# Patient Record
Sex: Female | Born: 1955 | Race: Black or African American | Hispanic: No | Marital: Married | Smoking: Former smoker
Health system: Southern US, Community
[De-identification: ages and names within clinical notes are randomized; demographics above are authoritative.]

## PROBLEM LIST (undated history)

## (undated) DIAGNOSIS — R51 Headache: Secondary | ICD-10-CM

## (undated) DIAGNOSIS — E119 Type 2 diabetes mellitus without complications: Secondary | ICD-10-CM

## (undated) DIAGNOSIS — D759 Disease of blood and blood-forming organs, unspecified: Secondary | ICD-10-CM

## (undated) DIAGNOSIS — R238 Other skin changes: Secondary | ICD-10-CM

## (undated) DIAGNOSIS — M1990 Unspecified osteoarthritis, unspecified site: Secondary | ICD-10-CM

## (undated) DIAGNOSIS — R233 Spontaneous ecchymoses: Secondary | ICD-10-CM

## (undated) DIAGNOSIS — R519 Headache, unspecified: Secondary | ICD-10-CM

## (undated) DIAGNOSIS — F419 Anxiety disorder, unspecified: Secondary | ICD-10-CM

## (undated) DIAGNOSIS — M79606 Pain in leg, unspecified: Secondary | ICD-10-CM

## (undated) DIAGNOSIS — I10 Essential (primary) hypertension: Secondary | ICD-10-CM

## (undated) DIAGNOSIS — E785 Hyperlipidemia, unspecified: Secondary | ICD-10-CM

## (undated) DIAGNOSIS — G4730 Sleep apnea, unspecified: Secondary | ICD-10-CM

## (undated) DIAGNOSIS — J189 Pneumonia, unspecified organism: Secondary | ICD-10-CM

## (undated) HISTORY — DX: Essential (primary) hypertension: I10

## (undated) HISTORY — PX: DILATION AND CURETTAGE OF UTERUS: SHX78

## (undated) HISTORY — DX: Headache, unspecified: R51.9

## (undated) HISTORY — PX: TOE SURGERY: SHX1073

## (undated) HISTORY — DX: Other skin changes: R23.8

## (undated) HISTORY — DX: Headache: R51

## (undated) HISTORY — DX: Pain in leg, unspecified: M79.606

## (undated) HISTORY — DX: Sleep apnea, unspecified: G47.30

## (undated) HISTORY — DX: Spontaneous ecchymoses: R23.3

## (undated) HISTORY — DX: Type 2 diabetes mellitus without complications: E11.9

---

## 2004-10-01 ENCOUNTER — Ambulatory Visit: Payer: Self-pay | Admitting: Internal Medicine

## 2005-12-03 ENCOUNTER — Ambulatory Visit: Payer: Self-pay

## 2006-07-31 ENCOUNTER — Ambulatory Visit: Payer: Self-pay | Admitting: Family Medicine

## 2006-12-01 ENCOUNTER — Ambulatory Visit: Payer: Self-pay | Admitting: Family Medicine

## 2006-12-10 ENCOUNTER — Ambulatory Visit: Payer: Self-pay | Admitting: Internal Medicine

## 2007-04-13 ENCOUNTER — Ambulatory Visit: Payer: Self-pay | Admitting: Internal Medicine

## 2007-04-16 ENCOUNTER — Ambulatory Visit: Payer: Self-pay | Admitting: Internal Medicine

## 2008-01-05 ENCOUNTER — Ambulatory Visit: Payer: Self-pay | Admitting: Internal Medicine

## 2008-09-16 ENCOUNTER — Ambulatory Visit: Payer: Self-pay | Admitting: Internal Medicine

## 2008-10-04 ENCOUNTER — Ambulatory Visit: Payer: Self-pay | Admitting: Internal Medicine

## 2008-10-17 ENCOUNTER — Ambulatory Visit: Payer: Self-pay | Admitting: Internal Medicine

## 2009-01-23 ENCOUNTER — Ambulatory Visit: Payer: Self-pay | Admitting: Internal Medicine

## 2009-03-07 ENCOUNTER — Ambulatory Visit: Payer: Self-pay | Admitting: Internal Medicine

## 2010-12-18 ENCOUNTER — Ambulatory Visit: Payer: Self-pay

## 2011-05-09 ENCOUNTER — Ambulatory Visit: Payer: Self-pay | Admitting: Gastroenterology

## 2011-05-10 LAB — PATHOLOGY REPORT

## 2012-01-16 ENCOUNTER — Ambulatory Visit: Payer: Self-pay

## 2012-01-28 ENCOUNTER — Ambulatory Visit: Payer: Self-pay | Admitting: Internal Medicine

## 2013-03-01 ENCOUNTER — Ambulatory Visit: Payer: Self-pay

## 2013-03-15 ENCOUNTER — Ambulatory Visit: Payer: Self-pay | Admitting: Podiatry

## 2013-09-14 ENCOUNTER — Ambulatory Visit: Payer: Self-pay | Admitting: Podiatry

## 2014-03-02 ENCOUNTER — Ambulatory Visit: Payer: Self-pay

## 2015-08-15 ENCOUNTER — Other Ambulatory Visit: Payer: Self-pay | Admitting: Nurse Practitioner

## 2015-08-15 ENCOUNTER — Ambulatory Visit
Admission: RE | Admit: 2015-08-15 | Discharge: 2015-08-15 | Disposition: A | Discharge status: Home / Self Care | Payer: PRIVATE HEALTH INSURANCE | Source: Ambulatory Visit | Attending: Nurse Practitioner | Admitting: Nurse Practitioner

## 2015-08-15 DIAGNOSIS — M25561 Pain in right knee: Secondary | ICD-10-CM

## 2015-08-15 DIAGNOSIS — M25571 Pain in right ankle and joints of right foot: Secondary | ICD-10-CM | POA: Insufficient documentation

## 2017-09-23 ENCOUNTER — Encounter: Payer: Self-pay | Admitting: Nurse Practitioner

## 2017-09-23 ENCOUNTER — Ambulatory Visit: Payer: Managed Care, Other (non HMO) | Admitting: Nurse Practitioner

## 2017-09-23 VITALS — BP 157/92 | HR 83 | Resp 93 | Ht 67.0 in | Wt 257.2 lb

## 2017-09-23 DIAGNOSIS — E559 Vitamin D deficiency, unspecified: Secondary | ICD-10-CM | POA: Diagnosis not present

## 2017-09-23 DIAGNOSIS — B9689 Other specified bacterial agents as the cause of diseases classified elsewhere: Secondary | ICD-10-CM

## 2017-09-23 DIAGNOSIS — E876 Hypokalemia: Secondary | ICD-10-CM | POA: Diagnosis not present

## 2017-09-23 DIAGNOSIS — N760 Acute vaginitis: Secondary | ICD-10-CM

## 2017-09-23 DIAGNOSIS — I10 Essential (primary) hypertension: Secondary | ICD-10-CM | POA: Diagnosis not present

## 2017-09-23 DIAGNOSIS — R300 Dysuria: Secondary | ICD-10-CM | POA: Insufficient documentation

## 2017-09-23 DIAGNOSIS — D229 Melanocytic nevi, unspecified: Secondary | ICD-10-CM | POA: Insufficient documentation

## 2017-09-23 DIAGNOSIS — N390 Urinary tract infection, site not specified: Secondary | ICD-10-CM

## 2017-09-23 DIAGNOSIS — M25571 Pain in right ankle and joints of right foot: Secondary | ICD-10-CM | POA: Insufficient documentation

## 2017-09-23 DIAGNOSIS — D509 Iron deficiency anemia, unspecified: Secondary | ICD-10-CM | POA: Insufficient documentation

## 2017-09-23 DIAGNOSIS — G4733 Obstructive sleep apnea (adult) (pediatric): Secondary | ICD-10-CM

## 2017-09-23 DIAGNOSIS — E669 Obesity, unspecified: Secondary | ICD-10-CM | POA: Diagnosis not present

## 2017-09-23 DIAGNOSIS — M25561 Pain in right knee: Secondary | ICD-10-CM | POA: Insufficient documentation

## 2017-09-23 DIAGNOSIS — R7301 Impaired fasting glucose: Secondary | ICD-10-CM | POA: Diagnosis not present

## 2017-09-23 DIAGNOSIS — R5383 Other fatigue: Secondary | ICD-10-CM

## 2017-09-23 DIAGNOSIS — E1159 Type 2 diabetes mellitus with other circulatory complications: Secondary | ICD-10-CM | POA: Insufficient documentation

## 2017-09-23 LAB — POCT URINALYSIS DIPSTICK
Bilirubin, UA: NEGATIVE
Blood, UA: NEGATIVE
Glucose, UA: NEGATIVE
Ketones, UA: NEGATIVE
Nitrite, UA: NEGATIVE
Spec Grav, UA: 1.01 (ref 1.010–1.025)
Urobilinogen, UA: 0.2 E.U./dL
pH, UA: 6.5 (ref 5.0–8.0)

## 2017-09-23 MED ORDER — METRONIDAZOLE 0.75 % VA GEL: 1.0000 | Freq: Every day | VAGINAL | 0 refills | Status: AC

## 2017-09-23 MED ORDER — NITROFURANTOIN MONOHYD MACRO 100 MG PO CAPS: 100.0000 mg | ORAL_CAPSULE | Freq: Two times a day (BID) | ORAL | 0 refills | Status: DC

## 2017-09-23 NOTE — Progress Notes (Signed)
[facility] [place], [place] [address]  Internal MEDICINE  Office Visit Note  Patient Name: Jill Banks  [id number]  [id number]  Date of Service: 09/28/2017     Complaints/HPI Pt is here for routine follow up.  Patient is here for routine follow up visit. Today, blood pressure is slightly elevated today. She is taking her BP medication as prescribed. Using CPAP every night. Is trying to lose weight. No longer taking appetite suppressants. Really did not see any difference in weight soe is taking part in group at work.  Continues to have some vaginal discharge, slightly yellow in color with mild odor. Has been treated for UTI over past few months. Improves the symptoms for a short time, but symptoms come right back.     Current Medication: Outpatient Encounter Medications as of 09/23/2017  Medication Sig  . calcitRIOL (ROCALTROL) 0.25 MCG capsule Take 0.25 mcg by mouth daily.  . hydrochlorothiazide (HYDRODIURIL) 25 MG tablet Take 25 mg by mouth daily.  Marland Kitchen olmesartan (BENICAR) 20 MG tablet Take 20 mg by mouth 2 (two) times daily.  . potassium chloride SA (KLOR-CON M20) 20 MEQ tablet Take 20 mEq by mouth 2 (two) times daily.  . metroNIDAZOLE (METROGEL) 0.75 % vaginal gel Place 1 Applicatorful vaginally at bedtime for 7 days.  . nitrofurantoin, macrocrystal-monohydrate, (MACROBID) 100 MG capsule Take 1 capsule (100 mg total) by mouth 2 (two) times daily.  . [DISCONTINUED] phentermine 37.5 MG capsule Take 37.5 mg by mouth daily.   No facility-administered encounter medications on file as of 09/23/2017.     Surgical History: Past Surgical History:  Procedure Laterality Date  . TOE SURGERY      Medical History: Past Medical History:  Diagnosis Date  . Bruises easily   . Headache   . Hypertension   . Leg pain   . Sleep apnea     Family History: Family History  Problem Relation Age of Onset  . Hypertension Father   . Stroke Father   . Hypertension  Brother   . Stroke Brother   . Hyperlipidemia Brother   . Asthma Son     Social History   Socioeconomic History  . Marital status: Married    Spouse name: Not on file  . Number of children: Not on file  . Years of education: Not on file  . Highest education level: Not on file  Social Needs  . Financial resource strain: Not on file  . Food insecurity - worry: Not on file  . Food insecurity - inability: Not on file  . Transportation needs - medical: Not on file  . Transportation needs - non-medical: Not on file  Occupational History  . Not on file  Tobacco Use  . Smoking status: Former Research scientist (life sciences)  . Smokeless tobacco: Never Used  Substance and Sexual Activity  . Alcohol use: Yes    Comment: ocassionally  . Drug use: No  . Sexual activity: Not on file  Other Topics Concern  . Not on file  Social History Narrative  . Not on file      Review of Systems  Constitutional: Negative for activity change, appetite change and unexpected weight change.  HENT: Negative for postnasal drip, rhinorrhea and sinus pressure.   Respiratory: Negative for shortness of breath and wheezing.   Cardiovascular: Negative for chest pain, palpitations and leg swelling.       Blood pressure elevated  Gastrointestinal: Negative for constipation, diarrhea, nausea and vomiting.  Endocrine: Negative.  Genitourinary: Positive for frequency, urgency and vaginal discharge.  Musculoskeletal: Negative.   Skin: Negative.   Allergic/Immunologic: Negative.   Neurological: Negative.   Hematological: Negative for adenopathy. Does not bruise/bleed easily.  Psychiatric/Behavioral: Negative for decreased concentration and sleep disturbance. The patient is not nervous/anxious.     Today's Vitals   09/23/17 1618  BP: (!) 157/92  Pulse: 83  Resp: (!) 93  Weight: 257 lb 3.2 oz (116.7 kg)  Height: 5\' 7"  (1.702 m)    Physical Exam  Constitutional: She is oriented to person, place, and time. She appears  well-developed and well-nourished.  HENT:  Head: Normocephalic and atraumatic.  Eyes: Pupils are equal, round, and reactive to light.  Neck: Normal range of motion. Neck supple. No thyromegaly present.  Cardiovascular: Normal rate, regular rhythm and normal heart sounds.  Pulmonary/Chest: Effort normal and breath sounds normal. She has no wheezes.  Abdominal: Soft. Bowel sounds are normal. There is no tenderness.  Genitourinary:  Genitourinary Comments: Urine sample positive for moderate WBC and trace protein.   Musculoskeletal: Normal range of motion.  Lymphadenopathy:    She has no cervical adenopathy.  Neurological: She is alert and oriented to person, place, and time.  Skin: Skin is warm and dry.  Psychiatric: She has a normal mood and affect.  Nursing note and vitals reviewed.   Assessment/Plan:    ICD-10-CM   1. Urinary tract infection without hematuria, site unspecified N39.0 POCT urinalysis dipstick    CULTURE, URINE COMPREHENSIVE    nitrofurantoin, macrocrystal-monohydrate, (MACROBID) 100 MG capsule    CANCELED: CULTURE, URINE COMPREHENSIVE  2. Impaired fasting glucose R73.01 Comprehensive metabolic panel    Hgb X7L w/o eAG  3. Obesity, unspecified classification, unspecified obesity type, unspecified whether serious comorbidity present E66.9 T4, free    TSH  4. Dysuria R30.0   5. Bacterial vaginitis N76.0 metroNIDAZOLE (METROGEL) 0.75 % vaginal gel   B96.89   6. Essential (primary) hypertension I10 CBC with Differential/Platelet    Comprehensive metabolic panel    Lipid panel  7. Hypokalemia E87.6 Comprehensive metabolic panel  8. Other fatigue R53.83 T4, free    TSH  9. Vitamin D deficiency, unspecified E55.9 Vitamin D 1,25 dihydroxy  10. Obstructive sleep apnea, adult G47.33   1. Start patient on macrobid 100mg  twice daily for 10 days. Send urine for culture and sensitivity and adjust antibiotics as indicated  2. Recommend OTC AZO to reduce bladder pain/spasms.   3. Metronidazole vaginal gel should be used every night for 7 nights.  4. Generally, bp stable. Continue bp medications as prescribed 5. CMP ordered 6. CMP and thyroid panel ordered today 7. Vitamin d test ordered/. Treat deficiency as indicated.  8. CMP and HgbA1c ordered for further evaluation 9. Thyroid panel ordered. Recommend patient follow a 1500calorie diet and participation in regular exercise program.  10 will inquired into new CPAP equipment. Continue regular visits witth Dr. Devona Konig as scheduled..    General Counseling: I have discussed the findings of the evaluation and examination with Rodena Piety.  I have also discussed any further diagnostic evaluation that may be needed or ordered today. Anastashia verbalizes understanding of the findings of todays visit. We also reviewed her medications today. she has been encouraged to call the office with any questions or concerns that should arise related to todays visit.  This patient was seen by Leretha Pol, FNP- C in Collaboration with Dr Lavera Guise as a part of collaborative care agreement    Time spent:25 minutes  Dr Lavera Guise Internal medicine

## 2017-09-26 LAB — CULTURE, URINE COMPREHENSIVE

## 2017-09-28 ENCOUNTER — Encounter: Payer: Self-pay | Admitting: Nurse Practitioner

## 2017-10-01 LAB — CBC WITH DIFFERENTIAL/PLATELET
Basophils Absolute: 0.1 10*3/uL (ref 0.0–0.2)
Basos: 1 %
EOS (ABSOLUTE): 0.4 10*3/uL (ref 0.0–0.4)
Eos: 5 %
Hematocrit: 41.7 % (ref 34.0–46.6)
Hemoglobin: 13.8 g/dL (ref 11.1–15.9)
Immature Grans (Abs): 0 10*3/uL (ref 0.0–0.1)
Immature Granulocytes: 0 %
Lymphocytes Absolute: 3 10*3/uL (ref 0.7–3.1)
Lymphs: 40 %
MCH: 27.1 pg (ref 26.6–33.0)
MCHC: 33.1 g/dL (ref 31.5–35.7)
MCV: 82 fL (ref 79–97)
Monocytes Absolute: 0.5 10*3/uL (ref 0.1–0.9)
Monocytes: 7 %
Neutrophils Absolute: 3.5 10*3/uL (ref 1.4–7.0)
Neutrophils: 47 %
Platelets: 152 10*3/uL (ref 150–379)
RBC: 5.1 x10E6/uL (ref 3.77–5.28)
RDW: 15.9 % — ABNORMAL HIGH (ref 12.3–15.4)
WBC: 7.5 10*3/uL (ref 3.4–10.8)

## 2017-10-01 LAB — COMPREHENSIVE METABOLIC PANEL
ALT: 16 IU/L (ref 0–32)
AST: 20 IU/L (ref 0–40)
Albumin/Globulin Ratio: 1.5 (ref 1.2–2.2)
Albumin: 4.3 g/dL (ref 3.6–4.8)
Alkaline Phosphatase: 69 IU/L (ref 39–117)
BUN/Creatinine Ratio: 19 (ref 12–28)
BUN: 12 mg/dL (ref 8–27)
Bilirubin Total: 0.3 mg/dL (ref 0.0–1.2)
CO2: 27 mmol/L (ref 20–29)
Calcium: 10 mg/dL (ref 8.7–10.3)
Chloride: 101 mmol/L (ref 96–106)
Creatinine, Ser: 0.64 mg/dL (ref 0.57–1.00)
GFR calc Af Amer: 111 mL/min/{1.73_m2} (ref >59)
GFR calc non Af Amer: 97 mL/min/{1.73_m2} (ref >59)
Globulin, Total: 2.8 g/dL (ref 1.5–4.5)
Glucose: 93 mg/dL (ref 65–99)
Potassium: 4.2 mmol/L (ref 3.5–5.2)
Sodium: 143 mmol/L (ref 134–144)
Total Protein: 7.1 g/dL (ref 6.0–8.5)

## 2017-10-01 LAB — LIPID PANEL
Chol/HDL Ratio: 3.3 ratio (ref 0.0–4.4)
Cholesterol, Total: 186 mg/dL (ref 100–199)
HDL: 57 mg/dL (ref >39)
LDL Calculated: 110 mg/dL — ABNORMAL HIGH (ref 0–99)
Triglycerides: 93 mg/dL (ref 0–149)
VLDL Cholesterol Cal: 19 mg/dL (ref 5–40)

## 2017-10-01 LAB — VITAMIN D 1,25 DIHYDROXY
Vitamin D 1, 25 (OH)2 Total: 44 pg/mL
Vitamin D2 1, 25 (OH)2: <10 pg/mL
Vitamin D3 1, 25 (OH)2: 41 pg/mL

## 2017-10-01 LAB — CULTURE, URINE COMPREHENSIVE

## 2017-10-01 LAB — TSH: TSH: 3.01 u[IU]/mL (ref 0.450–4.500)

## 2017-10-01 LAB — HGB A1C W/O EAG: Hgb A1c MFr Bld: 6.4 % — ABNORMAL HIGH (ref 4.8–5.6)

## 2017-10-01 LAB — T4, FREE: Free T4: 1.23 ng/dL (ref 0.82–1.77)

## 2017-10-06 NOTE — Progress Notes (Signed)
Called pt to let her know her labs look good.

## 2017-10-14 ENCOUNTER — Other Ambulatory Visit: Payer: Self-pay

## 2017-10-15 ENCOUNTER — Other Ambulatory Visit: Payer: Self-pay

## 2017-10-15 MED ORDER — OLMESARTAN MEDOXOMIL 40 MG PO TABS: 40.0000 mg | ORAL_TABLET | Freq: Every day | ORAL | 1 refills | Status: DC

## 2017-10-15 NOTE — Telephone Encounter (Signed)
Pt called insurance is not going to covered olmesartan 20 mg 2 day they covered olmesartan 40 mg one daily as per heather send new pres

## 2017-10-28 ENCOUNTER — Other Ambulatory Visit: Payer: Self-pay

## 2017-10-28 MED ORDER — POTASSIUM CHLORIDE CRYS ER 20 MEQ PO TBCR: 20.0000 meq | EXTENDED_RELEASE_TABLET | Freq: Two times a day (BID) | ORAL | 5 refills | Status: DC

## 2017-11-07 ENCOUNTER — Other Ambulatory Visit: Payer: Self-pay

## 2017-11-07 MED ORDER — HYDROCHLOROTHIAZIDE 25 MG PO TABS: 25.0000 mg | ORAL_TABLET | Freq: Every day | ORAL | 5 refills | Status: DC

## 2017-12-05 ENCOUNTER — Other Ambulatory Visit: Payer: Self-pay

## 2017-12-05 MED ORDER — CALCITRIOL 0.25 MCG PO CAPS: 0.2500 ug | ORAL_CAPSULE | Freq: Every day | ORAL | 5 refills | Status: DC

## 2017-12-18 ENCOUNTER — Encounter: Payer: Self-pay | Admitting: Nurse Practitioner

## 2017-12-18 ENCOUNTER — Ambulatory Visit: Payer: Managed Care, Other (non HMO) | Admitting: Nurse Practitioner

## 2017-12-18 VITALS — BP 163/98 | HR 92 | Resp 16 | Ht 67.0 in | Wt 260.0 lb

## 2017-12-18 DIAGNOSIS — B9689 Other specified bacterial agents as the cause of diseases classified elsewhere: Secondary | ICD-10-CM

## 2017-12-18 DIAGNOSIS — M064 Inflammatory polyarthropathy: Secondary | ICD-10-CM

## 2017-12-18 DIAGNOSIS — N760 Acute vaginitis: Secondary | ICD-10-CM | POA: Diagnosis not present

## 2017-12-18 DIAGNOSIS — R300 Dysuria: Secondary | ICD-10-CM | POA: Diagnosis not present

## 2017-12-18 DIAGNOSIS — R7301 Impaired fasting glucose: Secondary | ICD-10-CM | POA: Diagnosis not present

## 2017-12-18 DIAGNOSIS — I10 Essential (primary) hypertension: Secondary | ICD-10-CM | POA: Diagnosis not present

## 2017-12-18 LAB — POCT URINALYSIS DIPSTICK
Bilirubin, UA: NEGATIVE
Glucose, UA: NEGATIVE
Ketones, UA: NEGATIVE
Nitrite, UA: NEGATIVE
Protein, UA: NEGATIVE
Spec Grav, UA: 1.01 (ref 1.010–1.025)
Urobilinogen, UA: 0.2 E.U./dL
pH, UA: 7.5 (ref 5.0–8.0)

## 2017-12-18 MED ORDER — METRONIDAZOLE 500 MG PO TABS: 500.0000 mg | ORAL_TABLET | Freq: Two times a day (BID) | ORAL | 0 refills | Status: DC

## 2017-12-18 NOTE — Progress Notes (Signed)
[facility] [place], [place] [address]  Internal MEDICINE  Office Visit Note  Patient Name: Jill Banks  [id number]  [id number]  Date of Service: 01/11/2018  Chief Complaint  Patient presents with  . Vaginal Discharge    going on for two months medication given for it works for a little and then it stops working. no burning or itching     Continues to have some vaginal discharge, slightly yellow in color with mild odor. Has been treated for UTI over past few months. Improves the symptoms for a short time, but symptoms come right back. Most recently, she was treated in 09/2017. She states that symptoms did get better, but again, they keep coming right back.  The patient is also c/o pain and inflammation of both knees. Sometimes, this is in shoulders and back as well. She has family history of both lupus and Rheumatoid arthritis. Afraid she may be developing this issue.  Pt is here for a sick visit.     Current Medication:  Outpatient Encounter Medications as of 12/18/2017  Medication Sig  . calcitRIOL (ROCALTROL) 0.25 MCG capsule Take 1 capsule (0.25 mcg total) by mouth daily.  . hydrochlorothiazide (HYDRODIURIL) 25 MG tablet Take 1 tablet (25 mg total) by mouth daily.  . nitrofurantoin, macrocrystal-monohydrate, (MACROBID) 100 MG capsule Take 1 capsule (100 mg total) by mouth 2 (two) times daily.  . potassium chloride SA (KLOR-CON M20) 20 MEQ tablet Take 1 tablet (20 mEq total) by mouth 2 (two) times daily.  . [DISCONTINUED] olmesartan (BENICAR) 40 MG tablet Take 1 tablet (40 mg total) by mouth daily.  . metroNIDAZOLE (FLAGYL) 500 MG tablet Take 1 tablet (500 mg total) by mouth 2 (two) times daily.  . naproxen sodium (ALEVE) 220 MG tablet Take by mouth.   No facility-administered encounter medications on file as of 12/18/2017.       Medical History: Past Medical History:  Diagnosis Date  . Bruises easily   . Headache   . Hypertension   . Leg  pain   . Sleep apnea     Today's Vitals   12/18/17 1214  BP: (!) 163/98  Pulse: 92  Resp: 16  SpO2: 96%  Weight: 260 lb (117.9 kg)  Height: 5' 7"  (1.702 m)     Review of Systems  Constitutional: Positive for fatigue. Negative for activity change, appetite change and unexpected weight change.  HENT: Negative for postnasal drip, rhinorrhea and sinus pressure.   Eyes: Negative.   Respiratory: Negative for shortness of breath and wheezing.   Cardiovascular: Negative for chest pain, palpitations and leg swelling.       Blood pressure elevated  Gastrointestinal: Negative for constipation, diarrhea, nausea and vomiting.  Endocrine: Negative.   Genitourinary: Positive for dysuria, frequency, urgency and vaginal discharge.  Musculoskeletal: Positive for arthralgias.       Pain In both knees, sometimes in shoulders and upper back.   Skin: Negative.   Allergic/Immunologic: Negative.   Neurological: Negative for headaches.  Hematological: Negative for adenopathy. Does not bruise/bleed easily.  Psychiatric/Behavioral: Negative for decreased concentration and sleep disturbance. The patient is not nervous/anxious.     Physical Exam  Constitutional: She is oriented to person, place, and time. She appears well-developed and well-nourished. No distress.  HENT:  Head: Normocephalic and atraumatic.  Mouth/Throat: Oropharynx is clear and moist. No oropharyngeal exudate.  Eyes: Pupils are equal, round, and reactive to light. EOM are normal.  Neck: Normal range of motion. Neck  supple. No JVD present. No tracheal deviation present. No thyromegaly present.  Cardiovascular: Normal rate, regular rhythm and normal heart sounds. Exam reveals no gallop and no friction rub.  No murmur heard. Pulmonary/Chest: Effort normal and breath sounds normal. No respiratory distress. She has no wheezes. She has no rales. She exhibits no tenderness.  Abdominal: Soft. Bowel sounds are normal. There is no tenderness.   Genitourinary: No labial fusion. There is no rash, tenderness, lesion or injury on the right labia. There is no rash, tenderness, lesion or injury on the left labia. Cervix exhibits discharge. No erythema, tenderness or bleeding in the vagina. No foreign body in the vagina. No signs of injury around the vagina. No vaginal discharge found.  Genitourinary Comments: Small to moderate amount clear discharge. Very slight odor present.  Urine sample positive for large WBC and trace blood.   Musculoskeletal: Normal range of motion.  Mild, bilateral knee pain. Worse with flexion and extension. ROM and strength currently intact.   Lymphadenopathy:    She has no cervical adenopathy.  Neurological: She is alert and oriented to person, place, and time. No cranial nerve deficit.  Skin: Skin is warm and dry. She is not diaphoretic.  Psychiatric: She has a normal mood and affect. Her behavior is normal. Judgment and thought content normal.  Nursing note and vitals reviewed.   Assessment/Plan:  1. Bacterial vaginitis - metroNIDAZOLE (FLAGYL) 500 MG tablet; Take 1 tablet (500 mg total) by mouth 2 (two) times daily.  Dispense: 20 tablet; Refill: 0  2. Acute vaginitis Obtain vaginal sample today and will treat as indicated, based on results.  - NuSwab Vaginitis Plus (VG+)  3. Inflammatory polyarthritis ([place]) Check labs - ANA Direct w/Reflex if Positive - Rheumatoid Factor - Sed Rate (ESR)  4. Essential (primary) hypertension Generally stable. Continue bp medication as prescribed.  5. Impaired fasting glucose Check labs  - Basic Metabolic Panel (BMET) - HgB A1c  6. Dysuria Treat for bacterial vaginitis today. Will treat for UTI based on results of culture and sensitivity of urine sample.  - POCT Urinalysis Dipstick - CULTURE, URINE COMPREHENSIVE   General Counseling: Maisyn verbalizes understanding of the findings of todays visit and agrees with plan of treatment. I have discussed any further  diagnostic evaluation that may be needed or ordered today. We also reviewed her medications today. she has been encouraged to call the office with any questions or concerns that should arise related to todays visit.   This patient was seen by Leretha Pol, FNP- C in Collaboration with Dr Lavera Guise as a part of collaborative care agreement    Orders Placed This Encounter  Procedures  . CULTURE, URINE COMPREHENSIVE  . ANA Direct w/Reflex if Positive  . Rheumatoid Factor  . Sed Rate (ESR)  . Basic Metabolic Panel (BMET)  . HgB A1c  . NuSwab Vaginitis Plus (VG+)  . POCT Urinalysis Dipstick    Meds ordered this encounter  Medications  . metroNIDAZOLE (FLAGYL) 500 MG tablet    Sig: Take 1 tablet (500 mg total) by mouth 2 (two) times daily.    Dispense:  20 tablet    Refill:  0    Order Specific Question:   Supervising Provider    Answer:   Lavera Guise [[id number]]    Time spent: 25 Minutes

## 2017-12-20 LAB — CULTURE, URINE COMPREHENSIVE

## 2017-12-22 ENCOUNTER — Telehealth: Payer: Self-pay

## 2017-12-22 LAB — NUSWAB VAGINITIS PLUS (VG+)
Candida albicans, NAA: NEGATIVE
Candida glabrata, NAA: NEGATIVE
Chlamydia trachomatis, NAA: NEGATIVE
Neisseria gonorrhoeae, NAA: NEGATIVE
Trich vag by NAA: NEGATIVE

## 2017-12-22 NOTE — Telephone Encounter (Signed)
Error

## 2017-12-22 NOTE — Progress Notes (Signed)
Can you let the patient know that specimens taken at her visit showed just normal flora urinary tract infection. This is potentially contributing to the symptoms she is having. Continue antibitics as prescribed. If persistent after this treatment, may need to refer her to gYN. thanks

## 2017-12-22 NOTE — Progress Notes (Signed)
Pt was called and notified of the following.

## 2017-12-29 ENCOUNTER — Telehealth: Payer: Self-pay

## 2017-12-29 ENCOUNTER — Other Ambulatory Visit: Payer: Self-pay | Admitting: Nurse Practitioner

## 2017-12-29 DIAGNOSIS — I10 Essential (primary) hypertension: Secondary | ICD-10-CM

## 2017-12-29 MED ORDER — TELMISARTAN 80 MG PO TABS: 80.0000 mg | ORAL_TABLET | Freq: Every day | ORAL | 3 refills | Status: DC

## 2017-12-29 NOTE — Progress Notes (Signed)
D/c olmesartan d/t backorder. Start telmisartan 80mg  daily. New rx sent to her pharmacy.

## 2017-12-29 NOTE — Telephone Encounter (Signed)
Pt advised we change olmesartan to telmartan

## 2017-12-29 NOTE — Telephone Encounter (Signed)
D/c olmesartan d/t backorder. Start telmisartan 80mg  daily. New rx sent to her pharmacy.

## 2018-01-05 ENCOUNTER — Telehealth: Payer: Self-pay

## 2018-01-05 NOTE — Telephone Encounter (Signed)
With all of her testing being normal, I think it's time to send her to see GYN provider. thanks

## 2018-01-06 NOTE — Telephone Encounter (Signed)
Pt advised need to see gyn and send beth reminder for referal

## 2018-01-11 DIAGNOSIS — N760 Acute vaginitis: Secondary | ICD-10-CM | POA: Insufficient documentation

## 2018-01-13 ENCOUNTER — Encounter: Payer: Self-pay | Admitting: Obstetrics and Gynecology

## 2018-01-13 ENCOUNTER — Ambulatory Visit (INDEPENDENT_AMBULATORY_CARE_PROVIDER_SITE_OTHER): Payer: Managed Care, Other (non HMO) | Admitting: Obstetrics and Gynecology

## 2018-01-13 VITALS — BP 150/80 | HR 82 | Ht 67.0 in | Wt 258.0 lb

## 2018-01-13 DIAGNOSIS — N898 Other specified noninflammatory disorders of vagina: Secondary | ICD-10-CM

## 2018-01-13 LAB — POCT WET PREP WITH KOH
Clue Cells Wet Prep HPF POC: NEGATIVE
KOH Prep POC: NEGATIVE
Trichomonas, UA: NEGATIVE
WBC Wet Prep HPF POC: POSITIVE
Yeast Wet Prep HPF POC: NEGATIVE

## 2018-01-13 NOTE — Patient Instructions (Signed)
I value your feedback and entrusting us with your care. If you get a [facility] patient survey, I would appreciate you taking the time to let us know about your experience today. Thank you! 

## 2018-01-13 NOTE — Progress Notes (Signed)
Jill Freshwater, NP   Chief Complaint  Patient presents with  . Vaginal Discharge    HPI:      Ms. Jill Banks is a 62 y.o. [phone] who LMP was No LMP recorded., presents today for NP eval of persistent increased yellow, thin vag d/c, without irritation/odor. Sx for 3-4 months and started randomly. Referred by PCP Junius Creamer, Greer Ee, NP). Pt has been treated with metrogel and metronidazole with temporary relief of sx. Wears a pad but changes BID. [name] has SUI but pt sure it's vag d/c not urine in pad. Vaginitis culture 12/18/17 was neg for yeast, gon/chlam/trich. Pos for atopobium but low BVAB 2 and megasphaera 1. Pt denies any pelvic pain, vag dryness. She is occas sex active. No vag bleeding/spotting. Occas vasomotor sx. Neg urine C&S 4/19. Current on paps per pt report.   Past Medical History:  Diagnosis Date  . Bruises easily   . Headache   . Hypertension   . Leg pain   . Sleep apnea     Past Surgical History:  Procedure Laterality Date  . TOE SURGERY      Family History  Problem Relation Age of Onset  . Hypertension Father   . Stroke Father   . Hypertension Brother   . Stroke Brother   . Hyperlipidemia Brother   . Asthma Son     Social History   Socioeconomic History  . Marital status: Married    Spouse name: Not on file  . Number of children: Not on file  . Years of education: Not on file  . Highest education level: Not on file  Occupational History  . Not on file  Social Needs  . Financial resource strain: Not on file  . Food insecurity:    Worry: Not on file    Inability: Not on file  . Transportation needs:    Medical: Not on file    Non-medical: Not on file  Tobacco Use  . Smoking status: Former Research scientist (life sciences)  . Smokeless tobacco: Never Used  Substance and Sexual Activity  . Alcohol use: Yes    Comment: ocassionally  . Drug use: No  . Sexual activity: Yes  Lifestyle  . Physical activity:    Days per week: Not on file    Minutes per  session: Not on file  . Stress: Not on file  Relationships  . Social connections:    Talks on phone: Not on file    Gets together: Not on file    Attends religious service: Not on file    Active member of club or organization: Not on file    Attends meetings of clubs or organizations: Not on file    Relationship status: Not on file  . Intimate partner violence:    Fear of current or ex partner: Not on file    Emotionally abused: Not on file    Physically abused: Not on file    Forced sexual activity: Not on file  Other Topics Concern  . Not on file  Social History Narrative  . Not on file    Outpatient Medications Prior to Visit  Medication Sig Dispense Refill  . calcitRIOL (ROCALTROL) 0.25 MCG capsule Take 1 capsule (0.25 mcg total) by mouth daily. 30 capsule 5  . hydrochlorothiazide (HYDRODIURIL) 25 MG tablet Take 1 tablet (25 mg total) by mouth daily. 30 tablet 5  . metroNIDAZOLE (FLAGYL) 500 MG tablet Take 1 tablet (500 mg total) by mouth 2 (two)  times daily. 20 tablet 0  . naproxen sodium (ALEVE) 220 MG tablet Take by mouth.    . nitrofurantoin, macrocrystal-monohydrate, (MACROBID) 100 MG capsule Take 1 capsule (100 mg total) by mouth 2 (two) times daily. 20 capsule 0  . potassium chloride SA (KLOR-CON M20) 20 MEQ tablet Take 1 tablet (20 mEq total) by mouth 2 (two) times daily. 30 tablet 5  . telmisartan (MICARDIS) 80 MG tablet Take 1 tablet (80 mg total) by mouth daily. 30 tablet 3   No facility-administered medications prior to visit.       ROS:  Review of Systems  Constitutional: Negative for fatigue, fever and unexpected weight change.  Respiratory: Negative for cough, shortness of breath and wheezing.   Cardiovascular: Negative for chest pain, palpitations and leg swelling.  Gastrointestinal: Negative for blood in stool, constipation, diarrhea, nausea and vomiting.  Endocrine: Negative for cold intolerance, heat intolerance and polyuria.  Genitourinary: Positive  for dyspareunia and vaginal discharge. Negative for dysuria, flank pain, frequency, genital sores, hematuria, menstrual problem, pelvic pain, urgency, vaginal bleeding and vaginal pain.  Musculoskeletal: Positive for arthralgias. Negative for back pain, joint swelling and myalgias.  Skin: Negative for rash.  Neurological: Negative for dizziness, syncope, light-headedness, numbness and headaches.  Hematological: Negative for adenopathy.  Psychiatric/Behavioral: Negative for agitation, confusion, sleep disturbance and suicidal ideas. The patient is not nervous/anxious.     OBJECTIVE:   Vitals:  BP (!) 150/80   Pulse 82   Ht 5\' 7"  (1.702 m)   Wt 258 lb (117 kg)   BMI 40.41 kg/m   Physical Exam  Constitutional: She is oriented to person, place, and time. Vital signs are normal. She appears well-developed.  Pulmonary/Chest: Effort normal.  Genitourinary: Uterus normal. There is no rash, tenderness or lesion on the right labia. There is no rash, tenderness or lesion on the left labia. Uterus is not enlarged and not tender. Cervix exhibits no motion tenderness. Right adnexum displays no mass and no tenderness. Left adnexum displays no mass and no tenderness. No erythema or tenderness in the vagina. Vaginal discharge found.  Genitourinary Comments: THIN, YELLOW D/C, MILD ATROPHY   Musculoskeletal: Normal range of motion.  Neurological: She is alert and oriented to person, place, and time.  Psychiatric: She has a normal mood and affect. Her behavior is normal. Thought content normal.  Vitals reviewed.   Results: Results for orders placed or performed in visit on 01/13/18 (from the past 24 hour(s))  POCT Wet Prep with KOH     Status: Normal   Collection Time: 01/13/18  4:28 PM  Result Value Ref Range   Trichomonas, UA Negative    Clue Cells Wet Prep HPF POC neg    Epithelial Wet Prep HPF POC  Few, Moderate, Many, Too numerous to count   Yeast Wet Prep HPF POC neg    Bacteria Wet Prep HPF  POC  Few   RBC Wet Prep HPF POC     WBC Wet Prep HPF POC pos    KOH Prep POC Negative Negative     Assessment/Plan: Vaginal discharge - Pos exam, net wet prep. Check AV and BV culture (treated with flagyl after 12/18/17 atopobium on culture). Will call with results. If neg, could be normal. - Plan: Other/Misc lab test, POCT Wet Prep with KOH    Return if symptoms worsen or fail to improve.  Demri Poulton B. Siarra Gilkerson, PA-C 01/13/2018 4:30 PM

## 2018-01-20 ENCOUNTER — Telehealth: Payer: Self-pay | Admitting: Obstetrics and Gynecology

## 2018-01-20 MED ORDER — AMPICILLIN 500 MG PO CAPS: 500.0000 mg | ORAL_CAPSULE | Freq: Three times a day (TID) | ORAL | 0 refills | Status: AC

## 2018-01-20 MED ORDER — FLUCONAZOLE 150 MG PO TABS: 150.0000 mg | ORAL_TABLET | Freq: Once | ORAL | 0 refills | Status: AC

## 2018-01-20 NOTE — Telephone Encounter (Signed)
Thank you so much for the update. Jill Banks

## 2018-01-20 NOTE — Telephone Encounter (Signed)
Spoke with pt re: One Swab resutls. Neg for BV and AV but did have Enterococcus faecalis. Given pt's persistent vag d/c sx, will treat with Rx amp 500 mg TID for 7 days. Rx diflucan prn yeast vag sx. F/u if sx persist after tx.

## 2018-01-27 ENCOUNTER — Ambulatory Visit: Payer: Managed Care, Other (non HMO) | Admitting: Nurse Practitioner

## 2018-01-27 ENCOUNTER — Encounter: Payer: Self-pay | Admitting: Nurse Practitioner

## 2018-01-27 VITALS — BP 164/94 | HR 78 | Resp 16 | Ht 67.0 in | Wt 258.4 lb

## 2018-01-27 DIAGNOSIS — B9689 Other specified bacterial agents as the cause of diseases classified elsewhere: Secondary | ICD-10-CM | POA: Diagnosis not present

## 2018-01-27 DIAGNOSIS — M064 Inflammatory polyarthropathy: Secondary | ICD-10-CM | POA: Diagnosis not present

## 2018-01-27 DIAGNOSIS — N760 Acute vaginitis: Secondary | ICD-10-CM

## 2018-01-27 DIAGNOSIS — I10 Essential (primary) hypertension: Secondary | ICD-10-CM

## 2018-01-27 MED ORDER — OLMESARTAN MEDOXOMIL 40 MG PO TABS: 40.0000 mg | ORAL_TABLET | Freq: Every day | ORAL | 3 refills | Status: DC

## 2018-01-27 NOTE — Progress Notes (Signed)
[facility] [place], [place] [address]  Internal MEDICINE  Office Visit Note  Patient Name: Jill Banks  [id number]  [id number]  Date of Service: 01/27/2018  Chief Complaint  Patient presents with  . Hypertension    50month follow up    Had to switch from olmesartan to telmisartan due to shortage at pharmacy. This medication not working as well to control blood pressure. Has been having slight headache since change was made . Continues to have pain and swelling in her right ankle. Will take aleve on occasion to relieve pain. Labs ordered, but need to be reordered. Checking connective tissue panel.  Now seeing GYN for UTI/vaginal discharge. Is on last day of ampicillin.Has definitely improved, but has noted a slight discharge again over past few days.    Pt is here for routine follow up.    Current Medication: Outpatient Encounter Medications as of 01/27/2018  Medication Sig  . ampicillin (PRINCIPEN) 500 MG capsule Take 1 capsule (500 mg total) by mouth 3 (three) times daily for 7 days.  . calcitRIOL (ROCALTROL) 0.25 MCG capsule Take 1 capsule (0.25 mcg total) by mouth daily.  . hydrochlorothiazide (HYDRODIURIL) 25 MG tablet Take 1 tablet (25 mg total) by mouth daily.  . metroNIDAZOLE (FLAGYL) 500 MG tablet Take 1 tablet (500 mg total) by mouth 2 (two) times daily.  . naproxen sodium (ALEVE) 220 MG tablet Take by mouth.  . nitrofurantoin, macrocrystal-monohydrate, (MACROBID) 100 MG capsule Take 1 capsule (100 mg total) by mouth 2 (two) times daily.  . potassium chloride SA (KLOR-CON M20) 20 MEQ tablet Take 1 tablet (20 mEq total) by mouth 2 (two) times daily.  . [DISCONTINUED] telmisartan (MICARDIS) 80 MG tablet Take 1 tablet (80 mg total) by mouth daily.  Marland Kitchen olmesartan (BENICAR) 40 MG tablet Take 1 tablet (40 mg total) by mouth daily.   No facility-administered encounter medications on file as of 01/27/2018.     Surgical History: Past Surgical History:   Procedure Laterality Date  . TOE SURGERY      Medical History: Past Medical History:  Diagnosis Date  . Bruises easily   . Headache   . Hypertension   . Leg pain   . Sleep apnea     Family History: Family History  Problem Relation Age of Onset  . Hypertension Father   . Stroke Father   . Hypertension Brother   . Stroke Brother   . Hyperlipidemia Brother   . Asthma Son     Social History   Socioeconomic History  . Marital status: Married    Spouse name: Not on file  . Number of children: Not on file  . Years of education: Not on file  . Highest education level: Not on file  Occupational History  . Not on file  Social Needs  . Financial resource strain: Not on file  . Food insecurity:    Worry: Not on file    Inability: Not on file  . Transportation needs:    Medical: Not on file    Non-medical: Not on file  Tobacco Use  . Smoking status: Former Research scientist (life sciences)  . Smokeless tobacco: Never Used  Substance and Sexual Activity  . Alcohol use: Yes    Comment: ocassionally  . Drug use: No  . Sexual activity: Yes  Lifestyle  . Physical activity:    Days per week: Not on file    Minutes per session: Not on file  . Stress: Not on file  Relationships  . Social connections:    Talks on phone: Not on file    Gets together: Not on file    Attends religious service: Not on file    Active member of club or organization: Not on file    Attends meetings of clubs or organizations: Not on file    Relationship status: Not on file  . Intimate partner violence:    Fear of current or ex partner: Not on file    Emotionally abused: Not on file    Physically abused: Not on file    Forced sexual activity: Not on file  Other Topics Concern  . Not on file  Social History Narrative  . Not on file      Review of Systems  Constitutional: Negative for activity change, appetite change, fatigue and unexpected weight change.  HENT: Negative for congestion, postnasal drip,  rhinorrhea and sinus pressure.   Eyes: Negative.   Respiratory: Negative for shortness of breath and wheezing.   Cardiovascular: Negative for chest pain, palpitations and leg swelling.       Blood pressure elevated  Gastrointestinal: Negative for constipation, diarrhea, nausea and vomiting.  Endocrine: Negative for cold intolerance, heat intolerance, polydipsia, polyphagia and polyuria.  Genitourinary: Positive for vaginal discharge. Negative for dysuria, frequency and urgency.       Improved. Only having slight vaginal discharge at this time.   Musculoskeletal: Positive for arthralgias.       Pain In both knees, sometimes in shoulders and upper back. Has swelling and pain in right ankle with some reduced ROM and strength.   Skin: Negative for rash.  Allergic/Immunologic: Negative.   Neurological: Positive for headaches.  Hematological: Negative for adenopathy. Does not bruise/bleed easily.  Psychiatric/Behavioral: Negative for decreased concentration and sleep disturbance. The patient is not nervous/anxious.     Today's Vitals   01/27/18 1350  BP: (!) 164/94  Pulse: 78  Resp: 16  SpO2: 96%  Weight: 258 lb 6.4 oz (117.2 kg)  Height: 5\' 7"  (1.702 m)    Physical Exam  Constitutional: She is oriented to person, place, and time. She appears well-developed and well-nourished. No distress.  HENT:  Head: Normocephalic and atraumatic.  Mouth/Throat: Oropharynx is clear and moist. No oropharyngeal exudate.  Eyes: Pupils are equal, round, and reactive to light. EOM are normal.  Neck: Normal range of motion. Neck supple. No JVD present. Carotid bruit is not present. No tracheal deviation present. No thyromegaly present.  Cardiovascular: Normal rate, regular rhythm and normal heart sounds. Exam reveals no gallop and no friction rub.  No murmur heard. Pulmonary/Chest: Effort normal and breath sounds normal. No respiratory distress. She has no wheezes. She has no rales. She exhibits no  tenderness.  Abdominal: Soft. Bowel sounds are normal. There is no tenderness.  Musculoskeletal:  Mild/moderate swelling of right ankle. Slight reduction in ROM and strength. Generalized joint pain without point tenderness present.   Lymphadenopathy:    She has no cervical adenopathy.  Neurological: She is alert and oriented to person, place, and time. No cranial nerve deficit.  Skin: Skin is warm and dry. She is not diaphoretic.  Psychiatric: She has a normal mood and affect. Her behavior is normal. Judgment and thought content normal.  Nursing note and vitals reviewed.   Assessment/Plan: 1. Essential (primary) hypertension D/c telmisartan. Restart olmesartan 40mg  daily. Advised her to avoid excess salt and increase water intake in her diet. Monitor blood pressure at home.  - olmesartan (BENICAR) 40 MG tablet;  Take 1 tablet (40 mg total) by mouth daily.  Dispense: 30 tablet; Refill: 3  2. Inflammatory polyarthritis ([place]) Take NSAID as needed and as indicated. Rest and ice sore joints. Labslip given to check connective tissue panel. Will notify patient of results when available.   3. Bacterial vaginitis Finish antibiotic treatment as prescribed per GYN. Follow up as needed for persistent symptoms.    General Counseling: elia nunley understanding of the findings of todays visit and agrees with plan of treatment. I have discussed any further diagnostic evaluation that may be needed or ordered today. We also reviewed her medications today. she has been encouraged to call the office with any questions or concerns that should arise related to todays visit.  Hypertension Counseling:   The following hypertensive lifestyle modification were recommended and discussed:  1. Limiting alcohol intake to less than 1 oz/day of ethanol:(24 oz of beer or 8 oz of wine or 2 oz of 100-proof whiskey). 2. Take baby ASA 81 mg daily. 3. Importance of regular aerobic exercise and losing weight. 4. Reduce  dietary saturated fat and cholesterol intake for overall cardiovascular health. 5. Maintaining adequate dietary potassium, calcium, and magnesium intake. 6. Regular monitoring of the blood pressure. 7. Reduce sodium intake to less than 100 mmol/day (less than 2.3 gm of sodium or less than 6 gm of sodium choride)   This patient was seen by Leretha Pol, FNP- C in Collaboration with Dr Lavera Guise as a part of collaborative care agreement  Meds ordered this encounter  Medications  . olmesartan (BENICAR) 40 MG tablet    Sig: Take 1 tablet (40 mg total) by mouth daily.    Dispense:  30 tablet    Refill:  3    D/c. telmisartan and go back to olmesartan. telmisartan causing headaches.    Order Specific Question:   Supervising Provider    Answer:   Lavera Guise [[id number]]    Time spent: 5 Minutes     Dr Lavera Guise Internal medicine

## 2018-03-31 ENCOUNTER — Ambulatory Visit: Payer: Managed Care, Other (non HMO) | Admitting: Adult Health

## 2018-03-31 ENCOUNTER — Encounter: Payer: Self-pay | Admitting: Adult Health

## 2018-03-31 VITALS — BP 130/80 | HR 88 | Resp 16 | Ht 67.0 in | Wt 256.4 lb

## 2018-03-31 DIAGNOSIS — E669 Obesity, unspecified: Secondary | ICD-10-CM

## 2018-03-31 DIAGNOSIS — G4733 Obstructive sleep apnea (adult) (pediatric): Secondary | ICD-10-CM | POA: Diagnosis not present

## 2018-03-31 DIAGNOSIS — I10 Essential (primary) hypertension: Secondary | ICD-10-CM | POA: Diagnosis not present

## 2018-03-31 NOTE — Progress Notes (Signed)
[facility] [place], [place] [address]  Internal MEDICINE  Office Visit Note  Patient Name: Jill Banks  [id number]  [id number]  Date of Service: 04/01/2018  Chief Complaint  Patient presents with  . Sleep Apnea    on cpap    HPI Pt here for one year follow up on CPAP.  She denies current issues.  She currently uses a full face mask, and would like to switch to a nasal only if possible.  Current Medication: Outpatient Encounter Medications as of 03/31/2018  Medication Sig  . calcitRIOL (ROCALTROL) 0.25 MCG capsule Take 1 capsule (0.25 mcg total) by mouth daily.  . hydrochlorothiazide (HYDRODIURIL) 25 MG tablet Take 1 tablet (25 mg total) by mouth daily.  . metroNIDAZOLE (FLAGYL) 500 MG tablet Take 1 tablet (500 mg total) by mouth 2 (two) times daily.  . naproxen sodium (ALEVE) 220 MG tablet Take by mouth.  . nitrofurantoin, macrocrystal-monohydrate, (MACROBID) 100 MG capsule Take 1 capsule (100 mg total) by mouth 2 (two) times daily.  Marland Kitchen olmesartan (BENICAR) 40 MG tablet Take 1 tablet (40 mg total) by mouth daily.  . potassium chloride SA (KLOR-CON M20) 20 MEQ tablet Take 1 tablet (20 mEq total) by mouth 2 (two) times daily.   No facility-administered encounter medications on file as of 03/31/2018.     Surgical History: Past Surgical History:  Procedure Laterality Date  . TOE SURGERY      Medical History: Past Medical History:  Diagnosis Date  . Bruises easily   . Headache   . Hypertension   . Leg pain   . Sleep apnea     Family History: Family History  Problem Relation Age of Onset  . Hypertension Father   . Stroke Father   . Hypertension Brother   . Stroke Brother   . Hyperlipidemia Brother   . Asthma Son     Social History   Socioeconomic History  . Marital status: Married    Spouse name: Not on file  . Number of children: Not on file  . Years of education: Not on file  . Highest education level: Not on file  Occupational  History  . Not on file  Social Needs  . Financial resource strain: Not on file  . Food insecurity:    Worry: Not on file    Inability: Not on file  . Transportation needs:    Medical: Not on file    Non-medical: Not on file  Tobacco Use  . Smoking status: Former Research scientist (life sciences)  . Smokeless tobacco: Never Used  Substance and Sexual Activity  . Alcohol use: Yes    Comment: ocassionally  . Drug use: No  . Sexual activity: Yes  Lifestyle  . Physical activity:    Days per week: Not on file    Minutes per session: Not on file  . Stress: Not on file  Relationships  . Social connections:    Talks on phone: Not on file    Gets together: Not on file    Attends religious service: Not on file    Active member of club or organization: Not on file    Attends meetings of clubs or organizations: Not on file    Relationship status: Not on file  . Intimate partner violence:    Fear of current or ex partner: Not on file    Emotionally abused: Not on file    Physically abused: Not on file    Forced sexual activity: Not  on file  Other Topics Concern  . Not on file  Social History Narrative  . Not on file      Review of Systems  Constitutional: Negative for chills, fatigue and unexpected weight change.  HENT: Negative for congestion, rhinorrhea, sneezing and sore throat.   Eyes: Negative for photophobia, pain and redness.  Respiratory: Negative for cough, chest tightness and shortness of breath.   Cardiovascular: Negative for chest pain and palpitations.  Gastrointestinal: Negative for abdominal pain, constipation, diarrhea, nausea and vomiting.  Endocrine: Negative.   Genitourinary: Negative for dysuria and frequency.  Musculoskeletal: Negative for arthralgias, back pain, joint swelling and neck pain.  Skin: Negative for rash.  Allergic/Immunologic: Negative.   Neurological: Negative for tremors and numbness.  Hematological: Negative for adenopathy. Does not bruise/bleed easily.   Psychiatric/Behavioral: Negative for behavioral problems and sleep disturbance. The patient is not nervous/anxious.     Vital Signs: BP 130/80   Pulse 88   Resp 16   Ht 5\' 7"  (1.702 m)   Wt 256 lb 6.4 oz (116.3 kg)   SpO2 98%   BMI 40.16 kg/m    Physical Exam  Constitutional: She is oriented to person, place, and time. She appears well-developed and well-nourished. No distress.  HENT:  Head: Normocephalic and atraumatic.  Mouth/Throat: Oropharynx is clear and moist. No oropharyngeal exudate.  Eyes: Pupils are equal, round, and reactive to light. EOM are normal.  Neck: Normal range of motion. Neck supple. No JVD present. No tracheal deviation present. No thyromegaly present.  Cardiovascular: Normal rate, regular rhythm and normal heart sounds. Exam reveals no gallop and no friction rub.  No murmur heard. Pulmonary/Chest: Effort normal and breath sounds normal. No respiratory distress. She has no wheezes. She has no rales. She exhibits no tenderness.  Abdominal: Soft. There is no tenderness. There is no guarding.  Musculoskeletal: Normal range of motion.  Lymphadenopathy:    She has no cervical adenopathy.  Neurological: She is alert and oriented to person, place, and time. No cranial nerve deficit.  Skin: Skin is warm and dry. She is not diaphoretic.  Psychiatric: She has a normal mood and affect. Her behavior is normal. Judgment and thought content normal.  Nursing note and vitals reviewed.   Assessment/Plan: 1. Obstructive sleep apnea, adult She continues to tolerate CPAP and exhibits good compliance. Requesting new mask, that is nasal only.  Sleep clinic appt follow up.    2. Essential (primary) hypertension Good control of HTN exhibited.  Continue current medication regime.  3. Obesity, unspecified classification, unspecified obesity type, unspecified whether serious comorbidity present  Obesity Counseling: Risk Assessment: An assessment of behavioral risk factors was  made today and includes lack of exercise sedentary lifestyle, lack of portion control and poor dietary habits.  Risk Modification Advice: She was counseled on portion control guidelines. Restricting daily caloric intake to. . The detrimental long term effects of obesity on her health and ongoing poor compliance was also discussed with the patient.  She continues to work on her weight.  She reports walking, when the weather permits, and is cutting back on the amount she eats.     General Counseling: evamarie raetz understanding of the findings of todays visit and agrees with plan of treatment. I have discussed any further diagnostic evaluation that may be needed or ordered today. We also reviewed her medications today. she has been encouraged to call the office with any questions or concerns that should arise related to todays visit.   Time  spent: 25  Minutes   This patient was seen by Orson Gear AGNP-C in Collaboration with Dr Devona Konig as a part of collaborative care agreement

## 2018-04-02 ENCOUNTER — Telehealth: Payer: Self-pay

## 2018-04-02 NOTE — Telephone Encounter (Signed)
Faxed [facility] CPAP supply rx

## 2018-04-07 ENCOUNTER — Other Ambulatory Visit: Payer: Self-pay | Admitting: Nurse Practitioner

## 2018-04-08 LAB — SEDIMENTATION RATE: Sed Rate: 43 mm/hr — ABNORMAL HIGH (ref 0–40)

## 2018-04-08 LAB — ANA W/REFLEX: Anti Nuclear Antibody(ANA): NEGATIVE

## 2018-04-08 LAB — URIC ACID: Uric Acid: 6 mg/dL (ref 2.5–7.1)

## 2018-04-08 LAB — RHEUMATOID FACTOR: Rhuematoid fact SerPl-aCnc: <10 IU/mL (ref 0.0–13.9)

## 2018-04-08 LAB — AST: AST: 20 IU/L (ref 0–40)

## 2018-05-01 ENCOUNTER — Other Ambulatory Visit: Payer: Self-pay | Admitting: Nurse Practitioner

## 2018-05-01 MED ORDER — CALCITRIOL 0.25 MCG PO CAPS: 0.2500 ug | ORAL_CAPSULE | Freq: Every day | ORAL | 5 refills | Status: DC

## 2018-05-01 MED ORDER — HYDROCHLOROTHIAZIDE 25 MG PO TABS: 25.0000 mg | ORAL_TABLET | Freq: Every day | ORAL | 5 refills | Status: DC

## 2018-06-03 ENCOUNTER — Other Ambulatory Visit: Payer: Self-pay

## 2018-06-03 DIAGNOSIS — I10 Essential (primary) hypertension: Secondary | ICD-10-CM

## 2018-06-03 MED ORDER — OLMESARTAN MEDOXOMIL 40 MG PO TABS: 40.0000 mg | ORAL_TABLET | Freq: Every day | ORAL | 3 refills | Status: DC

## 2018-06-05 ENCOUNTER — Other Ambulatory Visit: Payer: Self-pay

## 2018-06-08 ENCOUNTER — Ambulatory Visit: Payer: Managed Care, Other (non HMO) | Admitting: Nurse Practitioner

## 2018-06-08 ENCOUNTER — Encounter: Payer: Self-pay | Admitting: Nurse Practitioner

## 2018-06-08 VITALS — BP 151/87 | HR 82 | Resp 16 | Ht 67.0 in | Wt 250.0 lb

## 2018-06-08 DIAGNOSIS — E559 Vitamin D deficiency, unspecified: Secondary | ICD-10-CM | POA: Diagnosis not present

## 2018-06-08 DIAGNOSIS — E876 Hypokalemia: Secondary | ICD-10-CM

## 2018-06-08 DIAGNOSIS — I10 Essential (primary) hypertension: Secondary | ICD-10-CM | POA: Diagnosis not present

## 2018-06-08 DIAGNOSIS — M064 Inflammatory polyarthropathy: Secondary | ICD-10-CM

## 2018-06-08 MED ORDER — OLMESARTAN MEDOXOMIL 40 MG PO TABS: 40.0000 mg | ORAL_TABLET | Freq: Every day | ORAL | 3 refills | Status: DC

## 2018-06-08 MED ORDER — CALCITRIOL 0.25 MCG PO CAPS: 0.2500 ug | ORAL_CAPSULE | Freq: Every day | ORAL | 5 refills | Status: DC

## 2018-06-08 MED ORDER — POTASSIUM CHLORIDE CRYS ER 20 MEQ PO TBCR: 20.0000 meq | EXTENDED_RELEASE_TABLET | Freq: Two times a day (BID) | ORAL | 5 refills | Status: DC

## 2018-06-08 NOTE — Progress Notes (Signed)
[facility] [place], [place] [address]  Internal MEDICINE  Office Visit Note  Patient Name: Jill Banks  [id number]  [id number]  Date of Service: 06/16/2018  Chief Complaint  Patient presents with  . Hypertension    4 Month follow up  . Headache  . Sleep Apnea    Hypertension  This is a chronic problem. The current episode started more than 1 year ago. The problem is unchanged. The problem is resistant. Associated symptoms include headaches. Pertinent negatives include no chest pain, palpitations or shortness of breath. Agents associated with hypertension include thyroid hormones. Risk factors for coronary artery disease include obesity and sedentary lifestyle. Past treatments include angiotensin blockers and diuretics. The current treatment provides moderate improvement. Compliance problems include exercise.        Current Medication: Outpatient Encounter Medications as of 06/08/2018  Medication Sig  . calcitRIOL (ROCALTROL) 0.25 MCG capsule Take 1 capsule (0.25 mcg total) by mouth daily.  . hydrochlorothiazide (HYDRODIURIL) 25 MG tablet Take 1 tablet (25 mg total) by mouth daily.  . naproxen sodium (ALEVE) 220 MG tablet Take by mouth.  . olmesartan (BENICAR) 40 MG tablet Take 1 tablet (40 mg total) by mouth daily.  . potassium chloride SA (KLOR-CON M20) 20 MEQ tablet Take 1 tablet (20 mEq total) by mouth 2 (two) times daily.  . [DISCONTINUED] calcitRIOL (ROCALTROL) 0.25 MCG capsule Take 1 capsule (0.25 mcg total) by mouth daily.  . [DISCONTINUED] metroNIDAZOLE (FLAGYL) 500 MG tablet Take 1 tablet (500 mg total) by mouth 2 (two) times daily.  . [DISCONTINUED] nitrofurantoin, macrocrystal-monohydrate, (MACROBID) 100 MG capsule Take 1 capsule (100 mg total) by mouth 2 (two) times daily.  . [DISCONTINUED] olmesartan (BENICAR) 40 MG tablet Take 1 tablet (40 mg total) by mouth daily.  . [DISCONTINUED] potassium chloride SA (KLOR-CON M20) 20 MEQ tablet Take  1 tablet (20 mEq total) by mouth 2 (two) times daily.   No facility-administered encounter medications on file as of 06/08/2018.     Surgical History: Past Surgical History:  Procedure Laterality Date  . TOE SURGERY      Medical History: Past Medical History:  Diagnosis Date  . Bruises easily   . Headache   . Hypertension   . Leg pain   . Sleep apnea     Family History: Family History  Problem Relation Age of Onset  . Hypertension Father   . Stroke Father   . Hypertension Brother   . Stroke Brother   . Hyperlipidemia Brother   . Asthma Son     Social History   Socioeconomic History  . Marital status: Married    Spouse name: Not on file  . Number of children: Not on file  . Years of education: Not on file  . Highest education level: Not on file  Occupational History  . Not on file  Social Needs  . Financial resource strain: Not on file  . Food insecurity:    Worry: Not on file    Inability: Not on file  . Transportation needs:    Medical: Not on file    Non-medical: Not on file  Tobacco Use  . Smoking status: Former Research scientist (life sciences)  . Smokeless tobacco: Never Used  Substance and Sexual Activity  . Alcohol use: Yes    Comment: ocassionally  . Drug use: No  . Sexual activity: Yes  Lifestyle  . Physical activity:    Days per week: Not on file    Minutes per session:  Not on file  . Stress: Not on file  Relationships  . Social connections:    Talks on phone: Not on file    Gets together: Not on file    Attends religious service: Not on file    Active member of club or organization: Not on file    Attends meetings of clubs or organizations: Not on file    Relationship status: Not on file  . Intimate partner violence:    Fear of current or ex partner: Not on file    Emotionally abused: Not on file    Physically abused: Not on file    Forced sexual activity: Not on file  Other Topics Concern  . Not on file  Social History Narrative  . Not on file       Review of Systems  Constitutional: Negative for activity change, appetite change, fatigue and unexpected weight change.  HENT: Negative for congestion, postnasal drip, rhinorrhea and sinus pressure.   Eyes: Negative.   Respiratory: Negative for shortness of breath and wheezing.   Cardiovascular: Negative for chest pain, palpitations and leg swelling.       Blood pressure elevated  Gastrointestinal: Negative for constipation, diarrhea, nausea and vomiting.  Endocrine: Negative for cold intolerance, heat intolerance, polydipsia, polyphagia and polyuria.  Genitourinary: Positive for vaginal discharge. Negative for dysuria, frequency and urgency.  Musculoskeletal: Positive for arthralgias.       Pain In both knees, sometimes in shoulders and upper back. Has swelling and pain in right ankle with some reduced ROM and strength.   Skin: Negative for rash.  Allergic/Immunologic: Negative for environmental allergies.  Neurological: Positive for headaches. Negative for dizziness.  Hematological: Negative for adenopathy. Does not bruise/bleed easily.  Psychiatric/Behavioral: Negative for agitation, decreased concentration and sleep disturbance. The patient is not nervous/anxious.    Today's Vitals   06/08/18 1555  BP: (!) 151/87  Pulse: 82  Resp: 16  SpO2: 98%  Weight: 250 lb (113.4 kg)  Height: 5\' 7"  (1.702 m)    Physical Exam  Constitutional: She is oriented to person, place, and time. She appears well-developed and well-nourished. No distress.  HENT:  Head: Normocephalic and atraumatic.  Mouth/Throat: Oropharynx is clear and moist. No oropharyngeal exudate.  Eyes: Pupils are equal, round, and reactive to light. EOM are normal.  Neck: Normal range of motion. Neck supple. No JVD present. Carotid bruit is not present. No tracheal deviation present. No thyromegaly present.  Cardiovascular: Normal rate, regular rhythm and normal heart sounds. Exam reveals no gallop and no friction rub.   No murmur heard. Pulmonary/Chest: Effort normal and breath sounds normal. No respiratory distress. She has no wheezes. She has no rales. She exhibits no tenderness.  Abdominal: Soft. Bowel sounds are normal. There is no tenderness.  Musculoskeletal:  Mild/moderate swelling of right ankle. Slight reduction in ROM and strength. Generalized joint pain without point tenderness present.   Lymphadenopathy:    She has no cervical adenopathy.  Neurological: She is alert and oriented to person, place, and time. No cranial nerve deficit.  Skin: Skin is warm and dry. She is not diaphoretic.  Psychiatric: She has a normal mood and affect. Her behavior is normal. Judgment and thought content normal.  Nursing note and vitals reviewed.  Assessment/Plan: 1. Essential (primary) hypertension Generally stable. Continue benicar 40mg  and HCTZ as prescribed. Will continue to monitor.  - olmesartan (BENICAR) 40 MG tablet; Take 1 tablet (40 mg total) by mouth daily.  Dispense: 30 tablet; Refill: 3  2. Hypokalemia Continue Kcl 20 MEQ daily. - potassium chloride SA (KLOR-CON M20) 20 MEQ tablet; Take 1 tablet (20 mEq total) by mouth 2 (two) times daily.  Dispense: 30 tablet; Refill: 5  3. Vitamin D deficiency, unspecified - calcitRIOL (ROCALTROL) 0.25 MCG capsule; Take 1 capsule (0.25 mcg total) by mouth daily.  Dispense: 30 capsule; Refill: 5  4. Inflammatory polyarthritis ([place]) Continue naproxen as needed and as prescribed   General Counseling: Ivone verbalizes understanding of the findings of todays visit and agrees with plan of treatment. I have discussed any further diagnostic evaluation that may be needed or ordered today. We also reviewed her medications today. she has been encouraged to call the office with any questions or concerns that should arise related to todays visit.   Hypertension Counseling:   The following hypertensive lifestyle modification were recommended and discussed:  1. Limiting  alcohol intake to less than 1 oz/day of ethanol:(24 oz of beer or 8 oz of wine or 2 oz of 100-proof whiskey). 2. Take baby ASA 81 mg daily. 3. Importance of regular aerobic exercise and losing weight. 4. Reduce dietary saturated fat and cholesterol intake for overall cardiovascular health. 5. Maintaining adequate dietary potassium, calcium, and magnesium intake. 6. Regular monitoring of the blood pressure. 7. Reduce sodium intake to less than 100 mmol/day (less than 2.3 gm of sodium or less than 6 gm of sodium choride)   This patient was seen by [place] with Dr Lavera Guise as a part of collaborative care agreement  Meds ordered this encounter  Medications  . olmesartan (BENICAR) 40 MG tablet    Sig: Take 1 tablet (40 mg total) by mouth daily.    Dispense:  30 tablet    Refill:  3    D/c. telmisartan and go back to olmesartan. telmisartan causing headaches.    Order Specific Question:   Supervising Provider    Answer:   Lavera Guise [[id number]]  . calcitRIOL (ROCALTROL) 0.25 MCG capsule    Sig: Take 1 capsule (0.25 mcg total) by mouth daily.    Dispense:  30 capsule    Refill:  5    Order Specific Question:   Supervising Provider    Answer:   Lavera Guise [[id number]]  . potassium chloride SA (KLOR-CON M20) 20 MEQ tablet    Sig: Take 1 tablet (20 mEq total) by mouth 2 (two) times daily.    Dispense:  30 tablet    Refill:  5    Order Specific Question:   Supervising Provider    Answer:   Lavera Guise [[id number]]    Time spent: 67 Minutes      Dr Lavera Guise Internal medicine

## 2018-11-02 ENCOUNTER — Other Ambulatory Visit: Payer: Self-pay

## 2018-11-02 DIAGNOSIS — I10 Essential (primary) hypertension: Secondary | ICD-10-CM

## 2018-11-02 MED ORDER — OLMESARTAN MEDOXOMIL 40 MG PO TABS: 40.0000 mg | ORAL_TABLET | Freq: Every day | ORAL | 3 refills | Status: DC

## 2018-11-20 ENCOUNTER — Other Ambulatory Visit: Payer: Self-pay

## 2018-11-20 DIAGNOSIS — E876 Hypokalemia: Secondary | ICD-10-CM

## 2018-11-20 MED ORDER — POTASSIUM CHLORIDE CRYS ER 20 MEQ PO TBCR: 20.0000 meq | EXTENDED_RELEASE_TABLET | Freq: Two times a day (BID) | ORAL | 5 refills | Status: DC

## 2018-12-07 ENCOUNTER — Encounter: Payer: Self-pay | Admitting: Nurse Practitioner

## 2018-12-07 ENCOUNTER — Other Ambulatory Visit: Payer: Self-pay

## 2018-12-07 ENCOUNTER — Ambulatory Visit: Payer: Managed Care, Other (non HMO) | Admitting: Nurse Practitioner

## 2018-12-07 VITALS — BP 175/95 | HR 82 | Resp 16 | Ht 67.0 in | Wt 254.8 lb

## 2018-12-07 DIAGNOSIS — E876 Hypokalemia: Secondary | ICD-10-CM

## 2018-12-07 DIAGNOSIS — I10 Essential (primary) hypertension: Secondary | ICD-10-CM

## 2018-12-07 DIAGNOSIS — M064 Inflammatory polyarthropathy: Secondary | ICD-10-CM

## 2018-12-07 MED ORDER — HYDROCHLOROTHIAZIDE 25 MG PO TABS: 25.0000 mg | ORAL_TABLET | Freq: Every day | ORAL | 5 refills | Status: DC

## 2018-12-07 MED ORDER — IRBESARTAN 300 MG PO TABS: 300.0000 mg | ORAL_TABLET | Freq: Every day | ORAL | 3 refills | Status: DC

## 2018-12-07 NOTE — Progress Notes (Signed)
[facility] [place], [place] [address]  Internal MEDICINE  Office Visit Note  Patient Name: Jill Banks  [id number]  [id number]  Date of Service: 12/19/2018  Chief Complaint  Patient presents with  . Medical Management of Chronic Issues    6 month follow up, pt have some concerns about her medication (olmesartan- having swelling in the ankles and knees and wondering if its coming from this mediciation, medication refills, needs labs for potassium and creatinine  . Hypertension    Hypertension  This is a chronic problem. The current episode started more than 1 year ago. The problem has been gradually worsening since onset. The problem is resistant. Associated symptoms include headaches. Pertinent negatives include no chest pain, palpitations or shortness of breath. Agents associated with hypertension include thyroid hormones. Risk factors for coronary artery disease include obesity and sedentary lifestyle. Past treatments include angiotensin blockers and diuretics. The current treatment provides moderate improvement. Compliance problems include exercise.        Current Medication: Outpatient Encounter Medications as of 12/07/2018  Medication Sig  . calcitRIOL (ROCALTROL) 0.25 MCG capsule Take 1 capsule (0.25 mcg total) by mouth daily.  . naproxen sodium (ALEVE) 220 MG tablet Take by mouth.  . potassium chloride SA (KLOR-CON M20) 20 MEQ tablet Take 1 tablet (20 mEq total) by mouth 2 (two) times daily.  . [DISCONTINUED] hydrochlorothiazide (HYDRODIURIL) 25 MG tablet Take 1 tablet (25 mg total) by mouth daily.  . [DISCONTINUED] hydrochlorothiazide (HYDRODIURIL) 25 MG tablet Take 1 tablet (25 mg total) by mouth daily.  . [DISCONTINUED] olmesartan (BENICAR) 40 MG tablet Take 1 tablet (40 mg total) by mouth daily.  . irbesartan (AVAPRO) 300 MG tablet Take 1 tablet (300 mg total) by mouth daily.   No facility-administered encounter medications on file as of  12/07/2018.     Surgical History: Past Surgical History:  Procedure Laterality Date  . TOE SURGERY      Medical History: Past Medical History:  Diagnosis Date  . Bruises easily   . Headache   . Hypertension   . Leg pain   . Sleep apnea     Family History: Family History  Problem Relation Age of Onset  . Hypertension Father   . Stroke Father   . Hypertension Brother   . Stroke Brother   . Hyperlipidemia Brother   . Asthma Son     Social History   Socioeconomic History  . Marital status: Married    Spouse name: Not on file  . Number of children: Not on file  . Years of education: Not on file  . Highest education level: Not on file  Occupational History  . Not on file  Social Needs  . Financial resource strain: Not on file  . Food insecurity:    Worry: Not on file    Inability: Not on file  . Transportation needs:    Medical: Not on file    Non-medical: Not on file  Tobacco Use  . Smoking status: Former Research scientist (life sciences)  . Smokeless tobacco: Never Used  Substance and Sexual Activity  . Alcohol use: Yes    Comment: ocassionally  . Drug use: No  . Sexual activity: Yes  Lifestyle  . Physical activity:    Days per week: Not on file    Minutes per session: Not on file  . Stress: Not on file  Relationships  . Social connections:    Talks on phone: Not on file    Gets  together: Not on file    Attends religious service: Not on file    Active member of club or organization: Not on file    Attends meetings of clubs or organizations: Not on file    Relationship status: Not on file  . Intimate partner violence:    Fear of current or ex partner: Not on file    Emotionally abused: Not on file    Physically abused: Not on file    Forced sexual activity: Not on file  Other Topics Concern  . Not on file  Social History Narrative  . Not on file      Review of Systems  Constitutional: Negative for activity change, appetite change, fatigue and unexpected weight  change.  HENT: Negative for congestion, postnasal drip, rhinorrhea and sinus pressure.   Respiratory: Negative for shortness of breath and wheezing.   Cardiovascular: Negative for chest pain, palpitations and leg swelling.       Blood pressure elevated  Gastrointestinal: Negative for constipation, diarrhea, nausea and vomiting.  Endocrine: Negative for cold intolerance, heat intolerance, polydipsia and polyuria.  Musculoskeletal: Positive for arthralgias.       Pain In both knees, sometimes in shoulders and upper back. Has swelling and pain in right ankle with some reduced ROM and strength.   Skin: Negative for rash.  Allergic/Immunologic: Negative for environmental allergies.  Neurological: Positive for headaches. Negative for dizziness.  Hematological: Negative for adenopathy. Does not bruise/bleed easily.  Psychiatric/Behavioral: Negative for agitation, decreased concentration and sleep disturbance. The patient is not nervous/anxious.     Today's Vitals   12/07/18 1556  BP: (!) 175/95  Pulse: 82  Resp: 16  SpO2: 98%  Weight: 254 lb 12.8 oz (115.6 kg)  Height: 5\' 7"  (1.702 m)   Body mass index is 39.91 kg/m.  Physical Exam Vitals signs and nursing note reviewed.  Constitutional:      General: She is not in acute distress.    Appearance: Normal appearance. She is well-developed. She is obese. She is not diaphoretic.  HENT:     Head: Normocephalic and atraumatic.     Mouth/Throat:     Pharynx: No oropharyngeal exudate.  Eyes:     Pupils: Pupils are equal, round, and reactive to light.  Neck:     Musculoskeletal: Normal range of motion and neck supple.     Thyroid: No thyromegaly.     Vascular: No carotid bruit or JVD.     Trachea: No tracheal deviation.  Cardiovascular:     Rate and Rhythm: Normal rate and regular rhythm.     Heart sounds: Normal heart sounds. No murmur. No friction rub. No gallop.   Pulmonary:     Effort: Pulmonary effort is normal. No respiratory  distress.     Breath sounds: Normal breath sounds. No wheezing or rales.  Chest:     Chest wall: No tenderness.  Abdominal:     General: Bowel sounds are normal.     Palpations: Abdomen is soft.     Tenderness: There is no abdominal tenderness.  Musculoskeletal:     Comments: Mild/moderate swelling of right ankle. Slight reduction in ROM and strength. Generalized joint pain without point tenderness present.   Lymphadenopathy:     Cervical: No cervical adenopathy.  Skin:    General: Skin is warm and dry.  Neurological:     Mental Status: She is alert and oriented to person, place, and time.     Cranial Nerves: No cranial nerve deficit.  Psychiatric:        Behavior: Behavior normal.        Thought Content: Thought content normal.        Judgment: Judgment normal.    Assessment/Plan: 1. Essential (primary) hypertension Change olmesartan to irbesartan 300mg  tablets. Hold HCTZ for now. Increase intake of water and increase exercise as tolerated.  - irbesartan (AVAPRO) 300 MG tablet; Take 1 tablet (300 mg total) by mouth daily.  Dispense: 30 tablet; Refill: 3  2. Inflammatory polyarthritis ([place]) Continue naproxen as needed and as indicated.   3. Hypokalemia Continue with potassium supplement twice daily as prescribed. Will check BMP for further evaluation.   General Counseling: lileigh fahringer understanding of the findings of todays visit and agrees with plan of treatment. I have discussed any further diagnostic evaluation that may be needed or ordered today. We also reviewed her medications today. she has been encouraged to call the office with any questions or concerns that should arise related to todays visit.  Hypertension Counseling:   The following hypertensive lifestyle modification were recommended and discussed:  1. Limiting alcohol intake to less than 1 oz/day of ethanol:(24 oz of beer or 8 oz of wine or 2 oz of 100-proof whiskey). 2. Take baby ASA 81 mg daily. 3.  Importance of regular aerobic exercise and losing weight. 4. Reduce dietary saturated fat and cholesterol intake for overall cardiovascular health. 5. Maintaining adequate dietary potassium, calcium, and magnesium intake. 6. Regular monitoring of the blood pressure. 7. Reduce sodium intake to less than 100 mmol/day (less than 2.3 gm of sodium or less than 6 gm of sodium choride)   This patient was seen by [place] with Dr Lavera Guise as a part of collaborative care agreement  Meds ordered this encounter  Medications  . irbesartan (AVAPRO) 300 MG tablet    Sig: Take 1 tablet (300 mg total) by mouth daily.    Dispense:  30 tablet    Refill:  3    D/c olmesartan as ineffective at this time.    Order Specific Question:   Supervising Provider    Answer:   Lavera Guise [[id number]]  . DISCONTD: hydrochlorothiazide (HYDRODIURIL) 25 MG tablet    Sig: Take 1 tablet (25 mg total) by mouth daily.    Dispense:  30 tablet    Refill:  5    Order Specific Question:   Supervising Provider    Answer:   Lavera Guise [[id number]]    Time spent: 47 Minutes      Dr Lavera Guise Internal medicine

## 2018-12-07 NOTE — Progress Notes (Signed)
Pt blood pressure elevated, informed provider. 

## 2018-12-09 ENCOUNTER — Other Ambulatory Visit: Payer: Self-pay

## 2018-12-09 DIAGNOSIS — I10 Essential (primary) hypertension: Secondary | ICD-10-CM

## 2018-12-09 MED ORDER — HYDROCHLOROTHIAZIDE 25 MG PO TABS: 25.0000 mg | ORAL_TABLET | Freq: Every day | ORAL | 5 refills | Status: DC

## 2018-12-29 ENCOUNTER — Ambulatory Visit: Payer: Managed Care, Other (non HMO) | Admitting: Podiatry

## 2018-12-29 ENCOUNTER — Encounter: Payer: Self-pay | Admitting: Nurse Practitioner

## 2018-12-29 ENCOUNTER — Other Ambulatory Visit: Payer: Self-pay

## 2018-12-29 ENCOUNTER — Ambulatory Visit: Payer: Managed Care, Other (non HMO) | Admitting: Nurse Practitioner

## 2018-12-29 ENCOUNTER — Encounter: Payer: Self-pay | Admitting: Podiatry

## 2018-12-29 ENCOUNTER — Ambulatory Visit (INDEPENDENT_AMBULATORY_CARE_PROVIDER_SITE_OTHER): Payer: Managed Care, Other (non HMO)

## 2018-12-29 VITALS — Resp 16 | Ht 64.0 in | Wt 254.0 lb

## 2018-12-29 DIAGNOSIS — I10 Essential (primary) hypertension: Secondary | ICD-10-CM | POA: Diagnosis not present

## 2018-12-29 DIAGNOSIS — M779 Enthesopathy, unspecified: Principal | ICD-10-CM

## 2018-12-29 DIAGNOSIS — M778 Other enthesopathies, not elsewhere classified: Secondary | ICD-10-CM

## 2018-12-29 DIAGNOSIS — M19071 Primary osteoarthritis, right ankle and foot: Secondary | ICD-10-CM | POA: Diagnosis not present

## 2018-12-29 DIAGNOSIS — M7751 Other enthesopathy of right foot: Secondary | ICD-10-CM | POA: Diagnosis not present

## 2018-12-29 DIAGNOSIS — M25571 Pain in right ankle and joints of right foot: Secondary | ICD-10-CM

## 2018-12-29 DIAGNOSIS — G8929 Other chronic pain: Secondary | ICD-10-CM | POA: Diagnosis not present

## 2018-12-29 DIAGNOSIS — G4733 Obstructive sleep apnea (adult) (pediatric): Secondary | ICD-10-CM

## 2018-12-29 MED ORDER — METHYLPREDNISOLONE 4 MG PO TBPK: ORAL_TABLET | ORAL | 0 refills | Status: DC

## 2018-12-29 MED ORDER — MELOXICAM 15 MG PO TABS: 15.0000 mg | ORAL_TABLET | Freq: Every day | ORAL | 1 refills | Status: DC

## 2018-12-29 NOTE — Progress Notes (Signed)
   HPI: 63 year old female presents the office today as a new patient regarding sharp shooting pains when walking.  Her current employment has the patient doing an extensive amount of walking daily.  The pain is slowly gotten worse over the past 3 weeks.  She denies injury.  She has diffuse right foot and ankle pain with swelling.  She does note that she does have flat feet.  Aggravated by walking and long periods of standing.  She has been using heat, ice, Epsom salt soaks with minimal relief.  Past Medical History:  Diagnosis Date  . Bruises easily   . Headache   . Hypertension   . Leg pain   . Sleep apnea      Physical Exam: General: The patient is alert and oriented x3 in no acute distress.  Dermatology: Skin is warm, dry and supple bilateral lower extremities. Negative for open lesions or macerations.  Vascular: Palpable pedal pulses bilaterally. No edema or erythema noted. Capillary refill within normal limits.  Neurological: Epicritic and protective threshold grossly intact bilaterally.   Musculoskeletal Exam: Range of motion within normal limits to all pedal and ankle joints bilateral. Muscle strength 5/5 in all groups bilateral.  Significant pain on palpation noted diffusely throughout the right rear foot and ankle.  Pain on range of motion with the ankle joint and subtalar joint.  Radiographic Exam:  Normal osseous mineralization.  Joint space narrowing and generalized DJD noted to the talonavicular, subtalar, and ankle joint.  No fracture/dislocation/boney destruction.    Assessment: 1.  Capsulitis/DJD right foot and ankle   Plan of Care:  1. Patient evaluated. X-Rays reviewed.  2.  Injection of 0.5 cc Celestone Soluspan injection of the right ankle joint medially 3.  Prescription for a Medrol Dosepak 4.  Prescription for meloxicam 15 mg 5.  Ankle brace dispensed today 6.  Today working to request authorization for custom molded orthotics from the patient's insurance  7.  Return to clinic in 6 weeks  *Works at a mental health facility.  Lives in [place], [place]      Edrick Kins, [facility]  Dr. Edrick Kins, DPM    [address], [place] [address]                Office [phone]  Fax [phone]

## 2018-12-29 NOTE — Progress Notes (Signed)
[facility] [place], [place] [address]  Internal MEDICINE  Telephone Visit  Patient Name: Jill Banks  [id number]  [id number]  Date of Service: 01/13/2019  I connected with the patient at 2:31 by webcam and verified the patients identity using two identifiers.   I discussed the limitations, risks, security and privacy concerns of performing an evaluation and management service by webcam and the availability of in person appointments. I also discussed with the patient that there may be a patient responsible charge related to the service.  The patient expressed understanding and agrees to proceed.    Chief Complaint  Patient presents with  . Telephone Assessment  . Telephone Screen  . Hypertension  . Quality Metric Gaps    pap in june     The patient has been contacted via webcam for follow up visit due to concerns for spread of novel coronavirus. The patient states that she is doing well. She had visit with podiatry earlier today. She did get injection into her right ankle to help with pain and swelling. She was also started on meloxicam for pain control. She states that her blood pressure has been doing well.  Will sometimes check it at work. She will check it on next work day and send message to me with results.       Current Medication: Outpatient Encounter Medications as of 12/29/2018  Medication Sig  . hydrochlorothiazide (HYDRODIURIL) 25 MG tablet Take 1 tablet (25 mg total) by mouth daily.  . irbesartan (AVAPRO) 300 MG tablet Take 1 tablet (300 mg total) by mouth daily.  . meloxicam (MOBIC) 15 MG tablet Take 1 tablet (15 mg total) by mouth daily.  . methylPREDNISolone (MEDROL DOSEPAK) 4 MG TBPK tablet 6 day dose pack - take as directed  . potassium chloride SA (KLOR-CON M20) 20 MEQ tablet Take 1 tablet (20 mEq total) by mouth 2 (two) times daily.  . [DISCONTINUED] calcitRIOL (ROCALTROL) 0.25 MCG capsule Take 1 capsule (0.25 mcg total) by mouth  daily.  . naproxen sodium (ALEVE) 220 MG tablet Take by mouth.   No facility-administered encounter medications on file as of 12/29/2018.     Surgical History: Past Surgical History:  Procedure Laterality Date  . TOE SURGERY      Medical History: Past Medical History:  Diagnosis Date  . Bruises easily   . Headache   . Hypertension   . Leg pain   . Sleep apnea     Family History: Family History  Problem Relation Age of Onset  . Hypertension Father   . Stroke Father   . Hypertension Brother   . Stroke Brother   . Hyperlipidemia Brother   . Asthma Son     Social History   Socioeconomic History  . Marital status: Married    Spouse name: Not on file  . Number of children: Not on file  . Years of education: Not on file  . Highest education level: Not on file  Occupational History  . Not on file  Social Needs  . Financial resource strain: Not on file  . Food insecurity:    Worry: Not on file    Inability: Not on file  . Transportation needs:    Medical: Not on file    Non-medical: Not on file  Tobacco Use  . Smoking status: Former Research scientist (life sciences)  . Smokeless tobacco: Never Used  Substance and Sexual Activity  . Alcohol use: Yes    Comment: ocassionally  .  Drug use: No  . Sexual activity: Yes  Lifestyle  . Physical activity:    Days per week: Not on file    Minutes per session: Not on file  . Stress: Not on file  Relationships  . Social connections:    Talks on phone: Not on file    Gets together: Not on file    Attends religious service: Not on file    Active member of club or organization: Not on file    Attends meetings of clubs or organizations: Not on file    Relationship status: Not on file  . Intimate partner violence:    Fear of current or ex partner: Not on file    Emotionally abused: Not on file    Physically abused: Not on file    Forced sexual activity: Not on file  Other Topics Concern  . Not on file  Social History Narrative  . Not on file       Review of Systems  Constitutional: Negative for activity change, appetite change, fatigue and unexpected weight change.  HENT: Negative for congestion, postnasal drip, rhinorrhea and sinus pressure.   Respiratory: Negative for shortness of breath and wheezing.   Cardiovascular: Negative for chest pain and palpitations.  Gastrointestinal: Negative for constipation, diarrhea, nausea and vomiting.  Endocrine: Negative for cold intolerance, heat intolerance, polydipsia and polyuria.  Musculoskeletal: Positive for arthralgias.       Persistent right ankle pain. Saw podiatry for this earlier today. Got steroid injection and was started on meloxicam to reduce pain and swelling.   Skin: Negative for rash.  Allergic/Immunologic: Negative for environmental allergies.  Neurological: Positive for headaches. Negative for dizziness.  Hematological: Negative for adenopathy. Does not bruise/bleed easily.  Psychiatric/Behavioral: Negative for agitation, decreased concentration and sleep disturbance. The patient is not nervous/anxious.     Today's Vitals   12/29/18 1406  Resp: 16  Weight: 254 lb (115.2 kg)  Height: 5\' 4"  (1.626 m)   Body mass index is 43.6 kg/m.  Observation/Objective: The patient is alert and oriented. She is pleasant and anwers all questions appropriately. She is in no acute distress at this time.   Assessment/Plan: 1. Essential (primary) hypertension Patient reporting stable blood pressure. Continue meds as prescribed. Check at work and clal with results for documentation.   2. Chronic pain of right ankle Continue visits with podiatry as needed.  3. Obstructive sleep apnea, adult Continue visits with Dr. Devona Konig for CPAP management  General Counseling: aoki wedemeyer understanding of the findings of today's phone visit and agrees with plan of treatment. I have discussed any further diagnostic evaluation that may be needed or ordered today. We also reviewed her  medications today. she has been encouraged to call the office with any questions or concerns that should arise related to todays visit.  Hypertension Counseling:   The following hypertensive lifestyle modification were recommended and discussed:  1. Limiting alcohol intake to less than 1 oz/day of ethanol:(24 oz of beer or 8 oz of wine or 2 oz of 100-proof whiskey). 2. Take baby ASA 81 mg daily. 3. Importance of regular aerobic exercise and losing weight. 4. Reduce dietary saturated fat and cholesterol intake for overall cardiovascular health. 5. Maintaining adequate dietary potassium, calcium, and magnesium intake. 6. Regular monitoring of the blood pressure. 7. Reduce sodium intake to less than 100 mmol/day (less than 2.3 gm of sodium or less than 6 gm of sodium choride)   This patient was seen by Leretha Pol  FNP Collaboration with Dr Lavera Guise as a part of collaborative care agreement  Time spent: 49 Minutes    Dr Lavera Guise Internal medicine

## 2019-01-04 ENCOUNTER — Other Ambulatory Visit: Payer: Self-pay

## 2019-01-04 ENCOUNTER — Other Ambulatory Visit: Payer: Self-pay | Admitting: Nurse Practitioner

## 2019-01-04 DIAGNOSIS — E559 Vitamin D deficiency, unspecified: Secondary | ICD-10-CM

## 2019-01-04 MED ORDER — CALCITRIOL 0.25 MCG PO CAPS: 0.2500 ug | ORAL_CAPSULE | Freq: Every day | ORAL | 5 refills | Status: DC

## 2019-01-12 ENCOUNTER — Ambulatory Visit: Payer: Managed Care, Other (non HMO) | Admitting: Nurse Practitioner

## 2019-01-13 DIAGNOSIS — G8929 Other chronic pain: Secondary | ICD-10-CM | POA: Insufficient documentation

## 2019-01-13 DIAGNOSIS — M25471 Effusion, right ankle: Secondary | ICD-10-CM | POA: Insufficient documentation

## 2019-01-13 DIAGNOSIS — M25571 Pain in right ankle and joints of right foot: Secondary | ICD-10-CM

## 2019-01-29 ENCOUNTER — Other Ambulatory Visit: Payer: Self-pay | Admitting: Nurse Practitioner

## 2019-01-30 LAB — CBC
Hematocrit: 43.4 % (ref 34.0–46.6)
Hemoglobin: 13.8 g/dL (ref 11.1–15.9)
MCH: 27.1 pg (ref 26.6–33.0)
MCHC: 31.8 g/dL (ref 31.5–35.7)
MCV: 85 fL (ref 79–97)
Platelets: 154 10*3/uL (ref 150–450)
RBC: 5.09 x10E6/uL (ref 3.77–5.28)
RDW: 15.5 % — ABNORMAL HIGH (ref 11.7–15.4)
WBC: 6.9 10*3/uL (ref 3.4–10.8)

## 2019-01-30 LAB — COMPREHENSIVE METABOLIC PANEL
ALT: 17 IU/L (ref 0–32)
AST: 19 IU/L (ref 0–40)
Albumin/Globulin Ratio: 1.7 (ref 1.2–2.2)
Albumin: 4.2 g/dL (ref 3.8–4.8)
Alkaline Phosphatase: 67 IU/L (ref 39–117)
BUN/Creatinine Ratio: 24 (ref 12–28)
BUN: 15 mg/dL (ref 8–27)
Bilirubin Total: 0.3 mg/dL (ref 0.0–1.2)
CO2: 24 mmol/L (ref 20–29)
Calcium: 9.6 mg/dL (ref 8.7–10.3)
Chloride: 104 mmol/L (ref 96–106)
Creatinine, Ser: 0.62 mg/dL (ref 0.57–1.00)
GFR calc Af Amer: 111 mL/min/{1.73_m2} (ref >59)
GFR calc non Af Amer: 96 mL/min/{1.73_m2} (ref >59)
Globulin, Total: 2.5 g/dL (ref 1.5–4.5)
Glucose: 94 mg/dL (ref 65–99)
Potassium: 4 mmol/L (ref 3.5–5.2)
Sodium: 143 mmol/L (ref 134–144)
Total Protein: 6.7 g/dL (ref 6.0–8.5)

## 2019-01-30 LAB — VITAMIN D 25 HYDROXY (VIT D DEFICIENCY, FRACTURES): Vit D, 25-Hydroxy: 61.1 ng/mL (ref 30.0–100.0)

## 2019-01-30 LAB — LIPID PANEL W/O CHOL/HDL RATIO
Cholesterol, Total: 200 mg/dL — ABNORMAL HIGH (ref 100–199)
HDL: 61 mg/dL (ref >39)
LDL Calculated: 118 mg/dL — ABNORMAL HIGH (ref 0–99)
Triglycerides: 106 mg/dL (ref 0–149)
VLDL Cholesterol Cal: 21 mg/dL (ref 5–40)

## 2019-01-30 LAB — T4, FREE: Free T4: 1.08 ng/dL (ref 0.82–1.77)

## 2019-01-30 LAB — TSH: TSH: 2.37 u[IU]/mL (ref 0.450–4.500)

## 2019-01-30 LAB — T3: T3, Total: 96 ng/dL (ref 71–180)

## 2019-01-30 LAB — HGB A1C W/O EAG: Hgb A1c MFr Bld: 6.2 % — ABNORMAL HIGH (ref 4.8–5.6)

## 2019-02-05 ENCOUNTER — Ambulatory Visit (INDEPENDENT_AMBULATORY_CARE_PROVIDER_SITE_OTHER): Payer: Managed Care, Other (non HMO) | Admitting: Podiatry

## 2019-02-05 ENCOUNTER — Encounter: Payer: Self-pay | Admitting: Podiatry

## 2019-02-05 ENCOUNTER — Other Ambulatory Visit: Payer: Self-pay

## 2019-02-05 VITALS — Temp 98.5°F

## 2019-02-05 DIAGNOSIS — M778 Other enthesopathies, not elsewhere classified: Secondary | ICD-10-CM

## 2019-02-05 DIAGNOSIS — M21611 Bunion of right foot: Secondary | ICD-10-CM | POA: Diagnosis not present

## 2019-02-05 DIAGNOSIS — M19071 Primary osteoarthritis, right ankle and foot: Secondary | ICD-10-CM

## 2019-02-05 DIAGNOSIS — M779 Enthesopathy, unspecified: Secondary | ICD-10-CM

## 2019-02-05 DIAGNOSIS — D1779 Benign lipomatous neoplasm of other sites: Secondary | ICD-10-CM

## 2019-02-05 DIAGNOSIS — D1720 Benign lipomatous neoplasm of skin and subcutaneous tissue of unspecified limb: Secondary | ICD-10-CM

## 2019-02-09 ENCOUNTER — Ambulatory Visit: Payer: Managed Care, Other (non HMO) | Admitting: Podiatry

## 2019-02-09 NOTE — Progress Notes (Signed)
   HPI: 63 year old female presents the office today for follow up evaluation of right foot and ankle pain. She states she is doing very well. She has been wearing the ankle brace which helps alleviate the pain. There are no worsening factors noted. Patient is here for further evaluation and treatment.   Past Medical History:  Diagnosis Date  . Bruises easily   . Headache   . Hypertension   . Leg pain   . Sleep apnea      Physical Exam: General: The patient is alert and oriented x3 in no acute distress.  Dermatology: Skin is warm, dry and supple bilateral lower extremities. Negative for open lesions or macerations.  Vascular: Palpable pedal pulses bilaterally. No edema or erythema noted. Capillary refill within normal limits.  Neurological: Epicritic and protective threshold grossly intact bilaterally.   Musculoskeletal Exam: Range of motion within normal limits to all pedal and ankle joints bilateral. Muscle strength 5/5 in all groups bilateral.  Significant pain on palpation noted diffusely throughout the right rear foot and ankle.  Pain on range of motion with the ankle joint and subtalar joint. Clinical evidence of bunion deformity noted to the respective foot. There is moderate pain on palpation range of motion of the first MPJ. Lateral deviation of the hallux noted consistent with hallux abductovalgus. Right anterior lateral ankle lipoma noted.   Assessment: 1. Capsulitis/DJD right foot and ankle - improved 2. 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 Patient evaluated 2. Continue taking Meloxicam as needed.  3. Continue using ankle brace.  4. Patient will call to schedule appointment for custom orthotics.  5. Discussed surgery at some point to alleviate bunion and lipoma symptoms.  6. Return to clinic as needed.   *Works at a mental health facility.  Lives in [place], [place].       Edrick Kins, DPM [facility]  Dr. Edrick Kins, DPM    [address], [place] [address]                Office [phone]  Fax [phone]

## 2019-03-17 ENCOUNTER — Ambulatory Visit: Payer: Managed Care, Other (non HMO) | Admitting: Orthotics

## 2019-03-17 ENCOUNTER — Other Ambulatory Visit: Payer: Self-pay

## 2019-03-17 DIAGNOSIS — M21611 Bunion of right foot: Secondary | ICD-10-CM

## 2019-03-17 DIAGNOSIS — M778 Other enthesopathies, not elsewhere classified: Secondary | ICD-10-CM

## 2019-03-17 DIAGNOSIS — M19071 Primary osteoarthritis, right ankle and foot: Secondary | ICD-10-CM

## 2019-03-17 NOTE — Progress Notes (Signed)
Saw patient for evaluation/assessment casting for CMFO rt.   Patient exhibits significant flatfoot deformity r > l.   She has also signficant RF valgus w/ prominent medial talus shift.  Patient is also has moderately rigid flatfoot.   She has moderate discomfort  when going through ROM in saggital/frontal planes.   We discussed cost of CMFO since she has [employer] and wants to be advised of coverage; we also discussed, given the severity of her condition, another option with an [place] brace.  I will discuss with Dr. Amalia Hailey about these possible options.  Dawn will verify insurance on both of these items.

## 2019-03-25 ENCOUNTER — Telehealth: Payer: Self-pay | Admitting: Podiatry

## 2019-03-25 NOTE — Telephone Encounter (Signed)
Left message for pt to call to discuss benefit coverage for the brace or orthotics.

## 2019-03-29 ENCOUNTER — Other Ambulatory Visit: Payer: Self-pay

## 2019-03-29 DIAGNOSIS — E876 Hypokalemia: Secondary | ICD-10-CM

## 2019-03-29 MED ORDER — POTASSIUM CHLORIDE CRYS ER 20 MEQ PO TBCR: 20.0000 meq | EXTENDED_RELEASE_TABLET | Freq: Two times a day (BID) | ORAL | 5 refills | Status: DC

## 2019-03-30 ENCOUNTER — Other Ambulatory Visit: Payer: Self-pay | Admitting: Orthopedic Surgery

## 2019-03-30 DIAGNOSIS — M1712 Unilateral primary osteoarthritis, left knee: Secondary | ICD-10-CM

## 2019-03-31 ENCOUNTER — Ambulatory Visit
Admission: RE | Admit: 2019-03-31 | Discharge: 2019-03-31 | Disposition: A | Discharge status: Home / Self Care | Payer: Managed Care, Other (non HMO) | Source: Ambulatory Visit | Attending: Orthopedic Surgery | Admitting: Orthopedic Surgery

## 2019-03-31 ENCOUNTER — Ambulatory Visit: Admission: RE | Admit: 2019-03-31 | Payer: Managed Care, Other (non HMO) | Source: Ambulatory Visit

## 2019-03-31 ENCOUNTER — Other Ambulatory Visit: Payer: Self-pay

## 2019-03-31 DIAGNOSIS — M1712 Unilateral primary osteoarthritis, left knee: Secondary | ICD-10-CM | POA: Insufficient documentation

## 2019-04-12 ENCOUNTER — Other Ambulatory Visit: Payer: Self-pay

## 2019-04-12 DIAGNOSIS — I10 Essential (primary) hypertension: Secondary | ICD-10-CM

## 2019-04-12 MED ORDER — IRBESARTAN 300 MG PO TABS: 300.0000 mg | ORAL_TABLET | Freq: Every day | ORAL | 3 refills | Status: DC

## 2019-04-13 ENCOUNTER — Ambulatory Visit: Payer: Managed Care, Other (non HMO) | Admitting: Internal Medicine

## 2019-04-13 ENCOUNTER — Encounter: Payer: Self-pay | Admitting: Internal Medicine

## 2019-04-13 ENCOUNTER — Other Ambulatory Visit: Payer: Self-pay

## 2019-04-13 VITALS — BP 149/99 | HR 85 | Resp 16 | Ht 67.0 in | Wt 252.0 lb

## 2019-04-13 DIAGNOSIS — G4733 Obstructive sleep apnea (adult) (pediatric): Secondary | ICD-10-CM | POA: Diagnosis not present

## 2019-04-13 DIAGNOSIS — E669 Obesity, unspecified: Secondary | ICD-10-CM

## 2019-04-13 DIAGNOSIS — Z9989 Dependence on other enabling machines and devices: Secondary | ICD-10-CM | POA: Diagnosis not present

## 2019-04-13 DIAGNOSIS — I10 Essential (primary) hypertension: Secondary | ICD-10-CM | POA: Diagnosis not present

## 2019-04-13 NOTE — Progress Notes (Signed)
[facility] [place], [place] [address]  Pulmonary Sleep Medicine   Office Visit Note  Patient Name: Jill Banks DOB: 1956/08/05 MRN [id number]  Date of Service: 04/13/2019  Complaints/HPI: Pt is here for follow up.  Pt reports she is wearing her cpap nightly.  She is cleaning her machine, and changing her filters and tubing as prescribed.  She denies any new or concerning issues.   ROS  General: (-) fever, (-) chills, (-) night sweats, (-) weakness Skin: (-) rashes, (-) itching,. Eyes: (-) visual changes, (-) redness, (-) itching. Nose and Sinuses: (-) nasal stuffiness or itchiness, (-) postnasal drip, (-) nosebleeds, (-) sinus trouble. Mouth and Throat: (-) sore throat, (-) hoarseness. Neck: (-) swollen glands, (-) enlarged thyroid, (-) neck pain. Respiratory: - cough, (-) bloody sputum, - shortness of breath, - wheezing. Cardiovascular: - ankle swelling, (-) chest pain. Lymphatic: (-) lymph node enlargement. Neurologic: (-) numbness, (-) tingling. Psychiatric: (-) anxiety, (-) depression   Current Medication: Outpatient Encounter Medications as of 04/13/2019  Medication Sig  . calcitRIOL (ROCALTROL) 0.25 MCG capsule Take 1 capsule (0.25 mcg total) by mouth daily.  . hydrochlorothiazide (HYDRODIURIL) 25 MG tablet Take 1 tablet (25 mg total) by mouth daily.  . irbesartan (AVAPRO) 300 MG tablet Take 1 tablet (300 mg total) by mouth daily.  . NON FORMULARY cpap device  . potassium chloride SA (KLOR-CON M20) 20 MEQ tablet Take 1 tablet (20 mEq total) by mouth 2 (two) times daily.  . [DISCONTINUED] calcitRIOL (ROCALTROL) 0.25 MCG capsule Take 1 capsule (0.25 mcg total) by mouth daily.  . [DISCONTINUED] hydrochlorothiazide (HYDRODIURIL) 25 MG tablet Take 1 tablet (25 mg total) by mouth daily.  . [DISCONTINUED] meloxicam (MOBIC) 15 MG tablet Take 1 tablet (15 mg total) by mouth daily. (Patient not taking: Reported on 04/13/2019)  . [DISCONTINUED]  methylPREDNISolone (MEDROL DOSEPAK) 4 MG TBPK tablet 6 day dose pack - take as directed (Patient not taking: Reported on 04/13/2019)  . [DISCONTINUED] naproxen sodium (ALEVE) 220 MG tablet Take by mouth.  . [DISCONTINUED] olmesartan (BENICAR) 40 MG tablet Take 1 tablet (40 mg total) by mouth daily.  . [DISCONTINUED] potassium chloride SA (KLOR-CON M20) 20 MEQ tablet Take 1 tablet (20 mEq total) by mouth 2 (two) times daily.   No facility-administered encounter medications on file as of 04/13/2019.     Surgical History: Past Surgical History:  Procedure Laterality Date  . TOE SURGERY      Medical History: Past Medical History:  Diagnosis Date  . Bruises easily   . Headache   . Hypertension   . Leg pain   . Sleep apnea     Family History: Family History  Problem Relation Age of Onset  . Hypertension Father   . Stroke Father   . Hypertension Brother   . Stroke Brother   . Hyperlipidemia Brother   . Asthma Son     Social History: Social History   Socioeconomic History  . Marital status: Married    Spouse name: Not on file  . Number of children: Not on file  . Years of education: Not on file  . Highest education level: Not on file  Occupational History  . Not on file  Social Needs  . Financial resource strain: Not on file  . Food insecurity    Worry: Not on file    Inability: Not on file  . Transportation needs    Medical: Not on file    Non-medical: Not on  file  Tobacco Use  . Smoking status: Former Research scientist (life sciences)  . Smokeless tobacco: Never Used  Substance and Sexual Activity  . Alcohol use: Yes    Comment: ocassionally  . Drug use: No  . Sexual activity: Yes  Lifestyle  . Physical activity    Days per week: Not on file    Minutes per session: Not on file  . Stress: Not on file  Relationships  . Social Herbalist on phone: Not on file    Gets together: Not on file    Attends religious service: Not on file    Active member of club or organization:  Not on file    Attends meetings of clubs or organizations: Not on file    Relationship status: Not on file  . Intimate partner violence    Fear of current or ex partner: Not on file    Emotionally abused: Not on file    Physically abused: Not on file    Forced sexual activity: Not on file  Other Topics Concern  . Not on file  Social History Narrative  . Not on file    Vital Signs: Blood pressure (!) 149/99, pulse 85, resp. rate 16, height 5\' 7"  (1.702 m), weight 252 lb (114.3 kg), SpO2 96 %.  Examination: General Appearance: The patient is well-developed, well-nourished, and in no distress. Skin: Gross inspection of skin unremarkable. Head: normocephalic, no gross deformities. Eyes: no gross deformities noted. ENT: ears appear grossly normal no exudates. Neck: Supple. No thyromegaly. No LAD. Respiratory: clear bilateraly. Cardiovascular: Normal S1 and S2 without murmur or rub. Extremities: No cyanosis. pulses are equal. Neurologic: Alert and oriented. No involuntary movements.  LABS: Recent Results (from the past 2160 hour(s))  Comprehensive metabolic panel     Status: None   Collection Time: 01/29/19 11:52 AM  Result Value Ref Range   Glucose 94 65 - 99 mg/dL   BUN 15 8 - 27 mg/dL   Creatinine, Ser 0.62 0.57 - 1.00 mg/dL   GFR calc non Af Amer 96 >59 mL/min/1.73   GFR calc Af Amer 111 >59 mL/min/1.73   BUN/Creatinine Ratio 24 12 - 28   Sodium 143 134 - 144 mmol/L   Potassium 4.0 3.5 - 5.2 mmol/L   Chloride 104 96 - 106 mmol/L   CO2 24 20 - 29 mmol/L   Calcium 9.6 8.7 - 10.3 mg/dL   Total Protein 6.7 6.0 - 8.5 g/dL   Albumin 4.2 3.8 - 4.8 g/dL   Globulin, Total 2.5 1.5 - 4.5 g/dL   Albumin/Globulin Ratio 1.7 1.2 - 2.2   Bilirubin Total 0.3 0.0 - 1.2 mg/dL   Alkaline Phosphatase 67 39 - 117 IU/L   AST 19 0 - 40 IU/L   ALT 17 0 - 32 IU/L  CBC     Status: Abnormal   Collection Time: 01/29/19 11:52 AM  Result Value Ref Range   WBC 6.9 3.4 - 10.8 x10E3/uL   RBC  5.09 3.77 - 5.28 x10E6/uL   Hemoglobin 13.8 11.1 - 15.9 g/dL   Hematocrit 43.4 34.0 - 46.6 %   MCV 85 79 - 97 fL   MCH 27.1 26.6 - 33.0 pg   MCHC 31.8 31.5 - 35.7 g/dL   RDW 15.5 (H) 11.7 - 15.4 %   Platelets 154 150 - 450 x10E3/uL  Lipid Panel w/o Chol/HDL Ratio     Status: Abnormal   Collection Time: 01/29/19 11:52 AM  Result Value Ref Range  Cholesterol, Total 200 (H) 100 - 199 mg/dL   Triglycerides 106 0 - 149 mg/dL   HDL 61 >39 mg/dL   VLDL Cholesterol Cal 21 5 - 40 mg/dL   LDL Calculated 118 (H) 0 - 99 mg/dL  Hgb A1c w/o eAG     Status: Abnormal   Collection Time: 01/29/19 11:52 AM  Result Value Ref Range   Hgb A1c MFr Bld 6.2 (H) 4.8 - 5.6 %    Comment:          Prediabetes: 5.7 - 6.4          Diabetes: >6.4          Glycemic control for adults with diabetes: <7.0   T4, free     Status: None   Collection Time: 01/29/19 11:52 AM  Result Value Ref Range   Free T4 1.08 0.82 - 1.77 ng/dL  TSH     Status: None   Collection Time: 01/29/19 11:52 AM  Result Value Ref Range   TSH 2.370 0.450 - 4.500 uIU/mL  VITAMIN D 25 Hydroxy (Vit-D Deficiency, Fractures)     Status: None   Collection Time: 01/29/19 11:52 AM  Result Value Ref Range   Vit D, 25-Hydroxy 61.1 30.0 - 100.0 ng/mL    Comment: Vitamin D deficiency has been defined by the [place] and an Endocrine Society practice guideline as a level of serum 25-OH vitamin D less than 20 ng/mL (1,2). The Endocrine Society went on to further define vitamin D insufficiency as a level between 21 and 29 ng/mL (2). 1. IOM (Institute of Medicine). 2010. Dietary reference    intakes for calcium and D. [place]: The    [employer]. 2. Holick MF, Binkley [place], Bischoff-Ferrari HA, et al.    Evaluation, treatment, and prevention of vitamin D    deficiency: an Endocrine Society clinical practice    guideline. JCEM. 2011 Jul; 96(7):1911-30.   T3     Status: None   Collection Time: 01/29/19 11:52 AM   Result Value Ref Range   T3, Total 96 71 - 180 ng/dL    Radiology: Ct Knee Left Wo Contrast  Result Date: 03/31/2019 CLINICAL DATA:  Patient for left knee replacement. Preoperative planning examination. EXAM: CT OF THE LEFT KNEE WITHOUT CONTRAST TECHNIQUE: Multidetector CT imaging of the left knee was performed according to the standard protocol. Multiplanar CT image reconstructions were also generated. Axial imaging only of the left hip and ankle was also obtained. COMPARISON:  Plain films left knee 07/31/2006. FINDINGS: Bones/Joint/Cartilage The left hip appears normal. Mild degenerative disease about the left SI joint and symphysis pubis noted. Left ankle is unremarkable. The patient has degenerative disease about the knee. Joint space narrowing and osteophytosis are worst medially. No acute or focal bony abnormality is identified. Ligaments Suboptimally assessed by CT. The cruciate and collateral ligaments appear intact. Muscles and Tendons No acute or focal abnormality. Soft tissues Small joint effusion. IMPRESSION: Advanced left knee osteoarthritis.  No acute finding. Electronically Signed   By: Inge Rise M.D.   On: 03/31/2019 14:18    No results found.  Ct Knee Left Wo Contrast  Result Date: 03/31/2019 CLINICAL DATA:  Patient for left knee replacement. Preoperative planning examination. EXAM: CT OF THE LEFT KNEE WITHOUT CONTRAST TECHNIQUE: Multidetector CT imaging of the left knee was performed according to the standard protocol. Multiplanar CT image reconstructions were also generated. Axial imaging only of the left hip and ankle was also obtained. COMPARISON:  Plain films left knee 07/31/2006. FINDINGS: Bones/Joint/Cartilage The left hip appears normal. Mild degenerative disease about the left SI joint and symphysis pubis noted. Left ankle is unremarkable. The patient has degenerative disease about the knee. Joint space narrowing and osteophytosis are worst medially. No acute or focal  bony abnormality is identified. Ligaments Suboptimally assessed by CT. The cruciate and collateral ligaments appear intact. Muscles and Tendons No acute or focal abnormality. Soft tissues Small joint effusion. IMPRESSION: Advanced left knee osteoarthritis.  No acute finding. Electronically Signed   By: Inge Rise M.D.   On: 03/31/2019 14:18      Assessment and Plan: Patient Active Problem List   Diagnosis Date Noted  . Chronic pain of right ankle 01/13/2019  . Acute vaginitis 01/11/2018  . Bacterial vaginitis 12/18/2017  . 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 Impaired fasting glucose 09/23/2017  . Obesity, unspecified 09/23/2017  . Hypokalemia 09/23/2017  . Melanocytic nevi, unspecified 09/23/2017  . Pain in right knee 09/23/2017  . Dysuria 09/23/2017  . Pain in right ankle and joints of right foot 09/23/2017  . Essential (primary) hypertension 09/23/2017  . Other fatigue 09/23/2017  . Vitamin D deficiency, unspecified 09/23/2017  . Iron deficiency anemia, unspecified 09/23/2017  . Obstructive sleep apnea, adult 09/23/2017    1. OSA on CPAP Continue Cpap nightly.   2. Essential (primary) hypertension Slightly elevated.  Pt reports this pressure is actually good for her. Encouraged her to continue to monitor in the future.   3. Obesity, unspecified classification, unspecified obesity type, unspecified whether serious comorbidity present Obesity Counseling: Risk Assessment: An assessment of behavioral risk factors was made today and includes lack of exercise sedentary lifestyle, lack of portion control and poor dietary habits.  Risk Modification Advice: She was counseled on portion control guidelines. Restricting daily caloric intake to. . The detrimental long term effects of obesity on her health and ongoing poor compliance was also discussed with the patient.    General Counseling: I have discussed the findings of the evaluation and examination with Rodena Piety.  I  have also discussed any further diagnostic evaluation thatmay be needed or ordered today. Deborra verbalizes understanding of the findings of todays visit. We also reviewed her medications today and discussed drug interactions and side effects including but not limited excessive drowsiness and altered mental states. We also discussed that there is always a risk not just to her but also people around her. she has been encouraged to call the office with any questions or concerns that should arise related to todays visit.    Time spent: 15 This patient was seen by Orson Gear AGNP-C in Collaboration with Dr. Devona Konig as a part of collaborative care agreement.   I have personally obtained a history, examined the patient, evaluated laboratory and imaging results, formulated the assessment and plan and placed orders.    Allyne Gee, MD [facility] Pulmonary and Critical Care Sleep medicine

## 2019-04-14 ENCOUNTER — Telehealth: Payer: Self-pay

## 2019-04-14 NOTE — Telephone Encounter (Signed)
Gave [facility] rx for a new cpap mask. Beth

## 2019-04-17 DIAGNOSIS — U071 COVID-19: Secondary | ICD-10-CM

## 2019-04-17 HISTORY — DX: COVID-19: U07.1

## 2019-05-04 ENCOUNTER — Telehealth: Payer: Self-pay | Admitting: Podiatry

## 2019-05-04 NOTE — Telephone Encounter (Signed)
Pt called wanting to know the price of the air fracture walker. I told her it is $350 and since she will be paying for it, she will need to pay $175 when she picks it up. Pt stated she would come by the office on 27 August.

## 2019-05-13 ENCOUNTER — Inpatient Hospital Stay: Admission: RE | Admit: 2019-05-13 | Payer: PRIVATE HEALTH INSURANCE | Source: Ambulatory Visit

## 2019-05-14 ENCOUNTER — Other Ambulatory Visit: Payer: PRIVATE HEALTH INSURANCE

## 2019-05-18 ENCOUNTER — Inpatient Hospital Stay: Admit: 2019-05-18 | Payer: PRIVATE HEALTH INSURANCE | Admitting: Orthopedic Surgery

## 2019-05-18 SURGERY — ARTHROPLASTY, KNEE, TOTAL
Anesthesia: Choice | Laterality: Left

## 2019-06-08 ENCOUNTER — Other Ambulatory Visit: Payer: Self-pay

## 2019-06-08 DIAGNOSIS — I10 Essential (primary) hypertension: Secondary | ICD-10-CM

## 2019-06-08 DIAGNOSIS — E559 Vitamin D deficiency, unspecified: Secondary | ICD-10-CM

## 2019-06-08 MED ORDER — HYDROCHLOROTHIAZIDE 25 MG PO TABS: 25.0000 mg | ORAL_TABLET | Freq: Every day | ORAL | 5 refills | Status: DC

## 2019-06-08 MED ORDER — CALCITRIOL 0.25 MCG PO CAPS: 0.2500 ug | ORAL_CAPSULE | Freq: Every day | ORAL | 5 refills | Status: DC

## 2019-07-01 ENCOUNTER — Other Ambulatory Visit: Payer: Self-pay

## 2019-07-01 ENCOUNTER — Encounter: Payer: Self-pay | Admitting: Nurse Practitioner

## 2019-07-01 ENCOUNTER — Ambulatory Visit (INDEPENDENT_AMBULATORY_CARE_PROVIDER_SITE_OTHER): Payer: Managed Care, Other (non HMO) | Admitting: Nurse Practitioner

## 2019-07-01 VITALS — BP 172/86 | HR 84 | Temp 97.3°F | Resp 16 | Ht 67.0 in | Wt 250.0 lb

## 2019-07-01 DIAGNOSIS — F5102 Adjustment insomnia: Secondary | ICD-10-CM

## 2019-07-01 DIAGNOSIS — E559 Vitamin D deficiency, unspecified: Secondary | ICD-10-CM | POA: Diagnosis not present

## 2019-07-01 DIAGNOSIS — Z1231 Encounter for screening mammogram for malignant neoplasm of breast: Secondary | ICD-10-CM

## 2019-07-01 DIAGNOSIS — Z0001 Encounter for general adult medical examination with abnormal findings: Secondary | ICD-10-CM

## 2019-07-01 DIAGNOSIS — R300 Dysuria: Secondary | ICD-10-CM

## 2019-07-01 DIAGNOSIS — I10 Essential (primary) hypertension: Secondary | ICD-10-CM

## 2019-07-01 DIAGNOSIS — Z124 Encounter for screening for malignant neoplasm of cervix: Secondary | ICD-10-CM | POA: Insufficient documentation

## 2019-07-01 DIAGNOSIS — K439 Ventral hernia without obstruction or gangrene: Secondary | ICD-10-CM | POA: Insufficient documentation

## 2019-07-01 MED ORDER — IRBESARTAN 300 MG PO TABS: 300.0000 mg | ORAL_TABLET | Freq: Every day | ORAL | 3 refills | Status: DC

## 2019-07-01 MED ORDER — BISOPROLOL FUMARATE 5 MG PO TABS: 5.0000 mg | ORAL_TABLET | Freq: Every day | ORAL | 2 refills | Status: DC

## 2019-07-01 MED ORDER — HYDROCHLOROTHIAZIDE 25 MG PO TABS: 25.0000 mg | ORAL_TABLET | Freq: Every day | ORAL | 5 refills | Status: DC

## 2019-07-01 MED ORDER — CALCITRIOL 0.25 MCG PO CAPS: 0.2500 ug | ORAL_CAPSULE | Freq: Every day | ORAL | 3 refills | Status: DC

## 2019-07-01 MED ORDER — ALPRAZOLAM 0.25 MG PO TABS: 0.2500 mg | ORAL_TABLET | Freq: Every evening | ORAL | 2 refills | Status: DC | PRN

## 2019-07-01 NOTE — Progress Notes (Signed)
[facility] [place], [place] [address]  Internal MEDICINE  Office Visit Note  Patient Name: Jill Banks  [id number]  [id number]  Date of Service: 07/01/2019   Pt is here for routine health maintenance examination  Chief Complaint  Patient presents with  . Annual Exam  . Hypertension  . Quality Metric Gaps    mammogram  . Insomnia    pt is not sleeping well due to family issues     The patient is here for health maintenance exam and pap smear. She has had some increased stress. Her husband has been ill over the past few months. Her brother has also been ill. She is trying to work full time. She states that she is having trouble sleeping due to the stress. She does take melatonin to help with sleep. She states that she sleeps for about 20 minutes then she is wide awake. She does have chronic knee pain. She is scheduled to have left knee replacement on 07/27/2019. Blood pressure is moderately elevated today. She even had a headache when she woke up this morning. She did take her blood pressure medication as prescribed. She denies chest pain, chest pressure, or shortness of breath.     Current Medication: Outpatient Encounter Medications as of 07/01/2019  Medication Sig  . acetaminophen (TYLENOL) 325 MG tablet Take 650 mg by mouth every 6 (six) hours as needed for moderate pain.  . calcitRIOL (ROCALTROL) 0.25 MCG capsule Take 1 capsule (0.25 mcg total) by mouth daily.  . diclofenac sodium (VOLTAREN) 1 % GEL Apply 2 g topically 4 (four) times daily as needed (knee pain).  . hydrochlorothiazide (HYDRODIURIL) 25 MG tablet Take 1 tablet (25 mg total) by mouth daily.  . irbesartan (AVAPRO) 300 MG tablet Take 1 tablet (300 mg total) by mouth daily.  . NON FORMULARY cpap device  . potassium chloride SA (KLOR-CON M20) 20 MEQ tablet Take 1 tablet (20 mEq total) by mouth 2 (two) times daily. (Patient taking differently: Take 40 mEq by mouth 2 (two) times daily. )   . [DISCONTINUED] calcitRIOL (ROCALTROL) 0.25 MCG capsule Take 1 capsule (0.25 mcg total) by mouth daily.  . [DISCONTINUED] hydrochlorothiazide (HYDRODIURIL) 25 MG tablet Take 1 tablet (25 mg total) by mouth daily.  . [DISCONTINUED] irbesartan (AVAPRO) 300 MG tablet Take 1 tablet (300 mg total) by mouth daily.  Marland Kitchen ALPRAZolam (XANAX) 0.25 MG tablet Take 1 tablet (0.25 mg total) by mouth at bedtime as needed for anxiety.  . bisoprolol (ZEBETA) 5 MG tablet Take 1 tablet (5 mg total) by mouth daily.   No facility-administered encounter medications on file as of 07/01/2019.     Surgical History: Past Surgical History:  Procedure Laterality Date  . TOE SURGERY      Medical History: Past Medical History:  Diagnosis Date  . Bruises easily   . Headache   . Hypertension   . Leg pain   . Sleep apnea     Family History: Family History  Problem Relation Age of Onset  . Hypertension Father   . Stroke Father   . Hypertension Brother   . Stroke Brother   . Hyperlipidemia Brother   . Asthma Son       Review of Systems  Constitutional: Negative for activity change, appetite change, fatigue and unexpected weight change.  HENT: Negative for congestion, postnasal drip, rhinorrhea and sinus pressure.   Respiratory: Negative for shortness of breath and wheezing.   Cardiovascular: Negative for chest pain  and palpitations.       Elevated blood pressure today.   Gastrointestinal: Negative for constipation, diarrhea, nausea and vomiting.  Endocrine: Negative for cold intolerance, heat intolerance, polydipsia and polyuria.  Genitourinary: Negative for dysuria and frequency.  Musculoskeletal: Negative for arthralgias and myalgias.  Skin: Negative for rash.  Allergic/Immunologic: Negative for environmental allergies.  Neurological: Positive for headaches. Negative for dizziness.  Hematological: Negative for adenopathy. Does not bruise/bleed easily.  Psychiatric/Behavioral: Positive for sleep  disturbance. Negative for agitation and decreased concentration. The patient is nervous/anxious.      Today's Vitals   07/01/19 1001  BP: (!) 172/86  Pulse: 84  Resp: 16  Temp: (!) 97.3 F (36.3 C)  SpO2: 100%  Weight: 250 lb (113.4 kg)  Height: 5\' 7"  (1.702 m)   Body mass index is 39.16 kg/m.  Physical Exam Vitals signs and nursing note reviewed.  Constitutional:      General: She is not in acute distress.    Appearance: Normal appearance. She is well-developed. She is obese. She is not diaphoretic.  HENT:     Head: Normocephalic and atraumatic.     Mouth/Throat:     Pharynx: No oropharyngeal exudate.  Eyes:     Pupils: Pupils are equal, round, and reactive to light.  Neck:     Musculoskeletal: Normal range of motion and neck supple.     Thyroid: No thyromegaly.     Vascular: No carotid bruit or JVD.     Trachea: No tracheal deviation.  Cardiovascular:     Rate and Rhythm: Normal rate and regular rhythm.     Pulses: Normal pulses.     Heart sounds: Normal heart sounds. No murmur. No friction rub. No gallop.      Comments: Mild pedal edema present bilaterally. Pulmonary:     Effort: Pulmonary effort is normal. No respiratory distress.     Breath sounds: Normal breath sounds. No wheezing or rales.  Chest:     Chest wall: No tenderness.     Breasts:        Right: Normal. No swelling, bleeding, inverted nipple, mass, nipple discharge, skin change or tenderness.        Left: No swelling, bleeding, inverted nipple, mass, nipple discharge, skin change or tenderness.  Abdominal:     General: Bowel sounds are normal.     Palpations: Abdomen is soft.     Tenderness: There is no abdominal tenderness.     Hernia: A hernia is present. There is no hernia in the left inguinal area or right inguinal area.     Comments: Large ventral hernia present.   Genitourinary:    General: Normal vulva.     Exam position: Supine.     Labia:        Right: No tenderness or lesion.         Left: No tenderness or lesion.      Vagina: Normal. No vaginal discharge, erythema or tenderness.     Cervix: No cervical motion tenderness, discharge, lesion or erythema.     Uterus: Normal.      Adnexa: Right adnexa normal and left adnexa normal.     Comments: No tenderness, masses, or organomeglay present during bimanual exam . Musculoskeletal: Normal range of motion.  Lymphadenopathy:     Cervical: No cervical adenopathy.     Upper Body:     Right upper body: No supraclavicular or axillary adenopathy.     Left upper body: No supraclavicular or axillary adenopathy.  Lower Body: No right inguinal adenopathy. No left inguinal adenopathy.  Skin:    General: Skin is warm and dry.  Neurological:     Mental Status: She is alert and oriented to person, place, and time.     Cranial Nerves: No cranial nerve deficit.  Psychiatric:        Behavior: Behavior normal.        Thought Content: Thought content normal.        Judgment: Judgment normal.   Assessment/Plan: 1. Encounter for general adult medical examination with abnormal findings Annual health maintenance exam and pap smear today.   2. Essential (primary) hypertension Add bisoprolol 5mg  every evening. Continue irbesartan and HCTZ as prescribed. Recommend a low salt diet and increasing low-impact exercise as tolerated  - hydrochlorothiazide (HYDRODIURIL) 25 MG tablet; Take 1 tablet (25 mg total) by mouth daily.  Dispense: 90 tablet; Refill: 5 - irbesartan (AVAPRO) 300 MG tablet; Take 1 tablet (300 mg total) by mouth daily.  Dispense: 90 tablet; Refill: 3 - bisoprolol (ZEBETA) 5 MG tablet; Take 1 tablet (5 mg total) by mouth daily.  Dispense: 30 tablet; Refill: 2  3. Vitamin D deficiency, unspecified - calcitRIOL (ROCALTROL) 0.25 MCG capsule; Take 1 capsule (0.25 mcg total) by mouth daily.  Dispense: 90 capsule; Refill: 3  4. Dysuria - UA/M w/rflx Culture, Routine  5. Adjustment insomnia Add alprazolam 0.25mg  at bedtime only  to help with anxiety which is causing insomnia. Discussed risk factors and side efects associated with taking BZOs.  - ALPRAZolam (XANAX) 0.25 MG tablet; Take 1 tablet (0.25 mg total) by mouth at bedtime as needed for anxiety.  Dispense: 30 tablet; Refill: 2  6. Encounter for screening mammogram for malignant neoplasm of breast - MM DIGITAL SCREENING BILATERAL; Future  7. Routine cervical smear - Pap IG and HPV (high risk) DNA detection  8. Ventral hernia without gangrene or obstruction  Large, non-tender ventral hernia. Will monitor.    General Counseling: jonetta debock understanding of the findings of todays visit and agrees with plan of treatment. I have discussed any further diagnostic evaluation that may be needed or ordered today. We also reviewed her medications today. she has been encouraged to call the office with any questions or concerns that should arise related to todays visit.    Counseling:  Hypertension Counseling:   The following hypertensive lifestyle modification were recommended and discussed:  1. Limiting alcohol intake to less than 1 oz/day of ethanol:(24 oz of beer or 8 oz of wine or 2 oz of 100-proof whiskey). 2. Take baby ASA 81 mg daily. 3. Importance of regular aerobic exercise and losing weight. 4. Reduce dietary saturated fat and cholesterol intake for overall cardiovascular health. 5. Maintaining adequate dietary potassium, calcium, and magnesium intake. 6. Regular monitoring of the blood pressure. 7. Reduce sodium intake to less than 100 mmol/day (less than 2.3 gm of sodium or less than 6 gm of sodium choride)   This patient was seen by [place] with Dr Lavera Guise as a part of collaborative care agreement  Orders Placed This Encounter  Procedures  . MM DIGITAL SCREENING BILATERAL  . UA/M w/rflx Culture, Routine    Meds ordered this encounter  Medications  . ALPRAZolam (XANAX) 0.25 MG tablet    Sig: Take 1 tablet (0.25  mg total) by mouth at bedtime as needed for anxiety.    Dispense:  30 tablet    Refill:  2    Order Specific Question:  Supervising Provider    Answer:   Lavera Guise [id number]  . calcitRIOL (ROCALTROL) 0.25 MCG capsule    Sig: Take 1 capsule (0.25 mcg total) by mouth daily.    Dispense:  90 capsule    Refill:  3    Order Specific Question:   Supervising Provider    Answer:   Lavera Guise [id number]  . hydrochlorothiazide (HYDRODIURIL) 25 MG tablet    Sig: Take 1 tablet (25 mg total) by mouth daily.    Dispense:  90 tablet    Refill:  5    Order Specific Question:   Supervising Provider    Answer:   Lavera Guise [id number]  . irbesartan (AVAPRO) 300 MG tablet    Sig: Take 1 tablet (300 mg total) by mouth daily.    Dispense:  90 tablet    Refill:  3    D/c olmesartan as ineffective at this time.    Order Specific Question:   Supervising Provider    Answer:   Lavera Guise [id number]  . bisoprolol (ZEBETA) 5 MG tablet    Sig: Take 1 tablet (5 mg total) by mouth daily.    Dispense:  30 tablet    Refill:  2    Order Specific Question:   Supervising Provider    Answer:   Lavera Guise [id number]    Time spent: [place], MD  Internal Medicine

## 2019-07-02 ENCOUNTER — Other Ambulatory Visit: Payer: Self-pay | Admitting: Orthopedic Surgery

## 2019-07-02 NOTE — Progress Notes (Signed)
Waiting on urine culture results.

## 2019-07-05 ENCOUNTER — Telehealth: Payer: Self-pay

## 2019-07-05 ENCOUNTER — Telehealth: Payer: Self-pay | Admitting: Internal Medicine

## 2019-07-05 ENCOUNTER — Other Ambulatory Visit: Payer: Self-pay

## 2019-07-05 MED ORDER — LEVOFLOXACIN 500 MG PO TABS: 500.0000 mg | ORAL_TABLET | Freq: Every day | ORAL | 0 refills | Status: AC

## 2019-07-05 NOTE — Telephone Encounter (Signed)
Per Dr. Clayborn Bigness called pt to notify her that her urine culture showed bacteria and I called in levaquin 500 mg po qd for 7days.

## 2019-07-05 NOTE — Telephone Encounter (Signed)
Pt was giving results and informed of meds at pharmacy

## 2019-07-06 LAB — UA/M W/RFLX CULTURE, ROUTINE
Bilirubin, UA: NEGATIVE
Glucose, UA: NEGATIVE
Ketones, UA: NEGATIVE
Nitrite, UA: NEGATIVE
Protein,UA: NEGATIVE
RBC, UA: NEGATIVE
Specific Gravity, UA: 1.023 (ref 1.005–1.030)
Urobilinogen, Ur: 0.2 mg/dL (ref 0.2–1.0)
pH, UA: 6 (ref 5.0–7.5)

## 2019-07-06 LAB — MICROSCOPIC EXAMINATION
Bacteria, UA: NONE SEEN
Casts: NONE SEEN /lpf

## 2019-07-06 LAB — URINE CULTURE, REFLEX

## 2019-07-09 LAB — PAP IG AND HPV HIGH-RISK: HPV, high-risk: NEGATIVE

## 2019-07-21 ENCOUNTER — Other Ambulatory Visit: Payer: Self-pay

## 2019-07-21 ENCOUNTER — Encounter
Admission: RE | Admit: 2019-07-21 | Discharge: 2019-07-21 | Disposition: A | Discharge status: Home / Self Care | Payer: Managed Care, Other (non HMO) | Source: Ambulatory Visit | Attending: Orthopedic Surgery | Admitting: Orthopedic Surgery

## 2019-07-21 DIAGNOSIS — I10 Essential (primary) hypertension: Secondary | ICD-10-CM | POA: Insufficient documentation

## 2019-07-21 DIAGNOSIS — Z01818 Encounter for other preprocedural examination: Secondary | ICD-10-CM | POA: Diagnosis present

## 2019-07-21 DIAGNOSIS — Z01810 Encounter for preprocedural cardiovascular examination: Secondary | ICD-10-CM | POA: Diagnosis not present

## 2019-07-21 HISTORY — DX: Unspecified osteoarthritis, unspecified site: M19.90

## 2019-07-21 HISTORY — DX: Anxiety disorder, unspecified: F41.9

## 2019-07-21 HISTORY — DX: Pneumonia, unspecified organism: J18.9

## 2019-07-21 LAB — BASIC METABOLIC PANEL
Anion gap: 10 (ref 5–15)
BUN: 19 mg/dL (ref 8–23)
CO2: 26 mmol/L (ref 22–32)
Calcium: 9.3 mg/dL (ref 8.9–10.3)
Chloride: 102 mmol/L (ref 98–111)
Creatinine, Ser: 0.73 mg/dL (ref 0.44–1.00)
GFR calc Af Amer: >60 mL/min (ref >60)
GFR calc non Af Amer: >60 mL/min (ref >60)
Glucose, Bld: 90 mg/dL (ref 70–99)
Potassium: 3.6 mmol/L (ref 3.5–5.1)
Sodium: 138 mmol/L (ref 135–145)

## 2019-07-21 LAB — URINALYSIS, ROUTINE W REFLEX MICROSCOPIC
Bacteria, UA: NONE SEEN
Bilirubin Urine: NEGATIVE
Glucose, UA: NEGATIVE mg/dL
Hgb urine dipstick: NEGATIVE
Ketones, ur: NEGATIVE mg/dL
Nitrite: NEGATIVE
Protein, ur: NEGATIVE mg/dL
Specific Gravity, Urine: 1.011 (ref 1.005–1.030)
pH: 6 (ref 5.0–8.0)

## 2019-07-21 LAB — TYPE AND SCREEN
ABO/RH(D): O POS
Antibody Screen: NEGATIVE

## 2019-07-21 LAB — SURGICAL PCR SCREEN
MRSA, PCR: NEGATIVE
Staphylococcus aureus: POSITIVE — AB

## 2019-07-21 LAB — PROTIME-INR
INR: 1.1 (ref 0.8–1.2)
Prothrombin Time: 14.3 seconds (ref 11.4–15.2)

## 2019-07-21 LAB — CBC
HCT: 43.8 % (ref 36.0–46.0)
Hemoglobin: 14.2 g/dL (ref 12.0–15.0)
MCH: 27.1 pg (ref 26.0–34.0)
MCHC: 32.4 g/dL (ref 30.0–36.0)
MCV: 83.6 fL (ref 80.0–100.0)
Platelets: 130 10*3/uL — ABNORMAL LOW (ref 150–400)
RBC: 5.24 MIL/uL — ABNORMAL HIGH (ref 3.87–5.11)
RDW: 15.3 % (ref 11.5–15.5)
WBC: 8.6 10*3/uL (ref 4.0–10.5)
nRBC: 0 % (ref 0.0–0.2)

## 2019-07-21 LAB — APTT: aPTT: 33 seconds (ref 24–36)

## 2019-07-21 LAB — SEDIMENTATION RATE: Sed Rate: 14 mm/hr (ref 0–30)

## 2019-07-21 NOTE — Patient Instructions (Signed)
Your procedure is scheduled on: July 27, 2019 TUESDAY Report to Day Surgery on the 2nd floor of the [facility]. To find out your arrival time, please call [phone] between 1PM - 3PM on: July 26, 2019 MONDAY  REMEMBER: Instructions that are not followed completely may result in serious medical risk, up to and including death; or upon the discretion of your surgeon and anesthesiologist your surgery may need to be rescheduled.  Do not eat food after midnight the night before surgery.  No gum chewing, lozengers or hard candies.  You may however, drink CLEAR liquids up to 4;30 AM.  DRINK YOUR CARB DRINK AT 4:30 THEN NOTHING BY MOUTH.  Clear liquids include: - water  - CLEAR gatorade  Do NOT drink anything that is not on this list.  Type 1 and Type 2 diabetics should only drink water.  No Alcohol for 24 hours before or after surgery. No Smoking including e-cigarettes for 24 hours prior to surgery.  No chewable tobacco products for at least 6 hours prior to surgery.  No nicotine patches on the day of surgery.  On the morning of surgery brush your teeth with toothpaste and water, you may rinse your mouth with mouthwash if you wish. Do not swallow any toothpaste or mouthwash.  Notify your doctor if there is any change in your medical condition (cold, fever, infection).  Do not wear jewelry, make-up, hairpins, clips or nail polish.  Do not wear lotions, powders, or perfumes.   Do not shave 48 hours prior to surgery.   Contacts and dentures may not be worn into surgery.  Do not bring valuables to the hospital, including drivers license, insurance or credit cards.  [facility] is not responsible for any belongings or valuables.   TAKE THESE MEDICATIONS THE MORNING OF SURGERY: BISOPROLOL - ZEBETA  DO NOT TAKE HYDROCHOLOTHIAZIDE OR IRBESARTAN  Use CHG Soap  as directed on instruction sheet.  Bring your C-PAP to the hospital with you in case you may have to spend the  night.    Stop Anti-inflammatories (NSAIDS) such as Advil, Aleve, Ibuprofen, Motrin, Naproxen, Naprosyn and Aspirin based products such as Excedrin, Goodys Powder, BC Powder. (May take Tylenol or Acetaminophen if needed.)  Stop ANY OVER THE COUNTER supplements until after surgery. (May continue Vitamin D, Vitamin B, and multivitamin.)  Wear comfortable clothing (specific to your surgery type) to the hospital.  Plan for stool softeners for home use.  If you are being admitted to the hospital overnight, [place] After surgery it may be brought to your room.  If you are being discharged the day of surgery, you will not be allowed to drive home. You will need a responsible adult to drive you home and stay with you that night.   If you are taking public transportation, you will need to have a responsible adult with you. Please confirm with your physician that it is acceptable to use public transportation.   Please call [phone] if you have any questions about these instructions.

## 2019-07-21 NOTE — Pre-Procedure Instructions (Signed)
Abnormal urinalysis and abnormal PCR screem-positive for staph faxed to Dr Rudene Christians office

## 2019-07-22 ENCOUNTER — Encounter: Payer: Self-pay | Admitting: Nurse Practitioner

## 2019-07-22 ENCOUNTER — Ambulatory Visit: Payer: Managed Care, Other (non HMO) | Admitting: Nurse Practitioner

## 2019-07-22 VITALS — BP 148/82 | HR 63 | Resp 16 | Ht 67.0 in | Wt 248.0 lb

## 2019-07-22 DIAGNOSIS — I10 Essential (primary) hypertension: Secondary | ICD-10-CM | POA: Diagnosis not present

## 2019-07-22 LAB — URINE CULTURE

## 2019-07-22 NOTE — Progress Notes (Signed)
[facility] [place], [place] [address]  Internal MEDICINE  Office Visit Note  Patient Name: Jill Banks  [id number]  [id number]  Date of Service: 08/13/2019  Chief Complaint  Patient presents with  . Hypertension    added bisoprolol     The patient is here for follow up of blood pressure. Was started on low dose bisoprolol in addition to other blood pressure medication. Blood pressure is improved. She reports no negative side effects associated with taking this medication.       Current Medication: Outpatient Encounter Medications as of 07/22/2019  Medication Sig  . ALPRAZolam (XANAX) 0.25 MG tablet Take 1 tablet (0.25 mg total) by mouth at bedtime as needed for anxiety.  . bisoprolol (ZEBETA) 5 MG tablet Take 1 tablet (5 mg total) by mouth daily.  . calcitRIOL (ROCALTROL) 0.25 MCG capsule Take 1 capsule (0.25 mcg total) by mouth daily.  . diclofenac sodium (VOLTAREN) 1 % GEL Apply 2 g topically 4 (four) times daily as needed (knee pain).  . hydrochlorothiazide (HYDRODIURIL) 25 MG tablet Take 1 tablet (25 mg total) by mouth daily.  . irbesartan (AVAPRO) 300 MG tablet Take 1 tablet (300 mg total) by mouth daily.  . NON FORMULARY cpap device  . potassium chloride SA (KLOR-CON M20) 20 MEQ tablet Take 1 tablet (20 mEq total) by mouth 2 (two) times daily.  . [DISCONTINUED] naproxen sodium (ALEVE) 220 MG tablet Take 220 mg by mouth daily as needed (pain).   No facility-administered encounter medications on file as of 07/22/2019.     Surgical History: Past Surgical History:  Procedure Laterality Date  . APPLICATION OF WOUND VAC 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   Procedure: APPLICATION OF WOUND VAC;  Surgeon: Hessie Knows, MD;  Location: [facility] ORS;  Service: Orthopedics;  Laterality: Left;  [id number]  . DILATION AND CURETTAGE OF UTERUS     x 3  . TOE SURGERY Left    second toe  . 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 Anxiety   . Arthritis   . Bruises easily   . Headache   . Hypertension   . Leg pain   . Pneumonia    about 5 yrs ago  . Sleep apnea     Family History: Family History  Problem Relation Age of Onset  . Hypertension Father   . Stroke Father   . Hypertension Brother   . Stroke Brother   . Hyperlipidemia Brother   . Asthma Son     Social History   Socioeconomic History  . Marital status: Married    Spouse name: Not on file  . Number of children: Not on file  . Years of education: Not on file  . Highest education level: Not on file  Occupational History  . Not on file  Social Needs  . Financial resource strain: Not on file  . Food insecurity    Worry: Not on file    Inability: Not on file  . Transportation needs    Medical: Not on file    Non-medical: Not on file  Tobacco Use  . Smoking status: Former Research scientist (life sciences)  . Smokeless tobacco: Never Used  Substance and Sexual Activity  . Alcohol use: Yes    Comment: ocassionally  . Drug use: No  . Sexual  activity: Yes  Lifestyle  . Physical activity    Days per week: Not on file    Minutes per session: Not on file  . Stress: Not on file  Relationships  . Social Herbalist on phone: Not on file    Gets together: Not on file    Attends religious service: Not on file    Active member of club or organization: Not on file    Attends meetings of clubs or organizations: Not on file    Relationship status: Not on file  . Intimate partner violence    Fear of current or ex partner: Not on file    Emotionally abused: Not on file    Physically abused: Not on file    Forced sexual activity: Not on file  Other Topics Concern  . Not on file  Social History Narrative  . Not on file      Review of Systems  Constitutional: Negative for activity change, appetite  change, fatigue and unexpected weight change.  HENT: Negative for congestion, postnasal drip, rhinorrhea and sinus pressure.   Respiratory: Negative for shortness of breath and wheezing.   Cardiovascular: Negative for chest pain and palpitations.       Improved blood pressure today.   Gastrointestinal: Negative for constipation, diarrhea, nausea and vomiting.  Endocrine: Negative for cold intolerance, heat intolerance, polydipsia and polyuria.  Musculoskeletal: Negative for arthralgias and myalgias.  Skin: Negative for rash.  Allergic/Immunologic: Negative for environmental allergies.  Neurological: Positive for headaches. Negative for dizziness.       Headaches are less severe and less frequent since starting on bisoprolol   Hematological: Negative for adenopathy. Does not bruise/bleed easily.  Psychiatric/Behavioral: Positive for sleep disturbance. Negative for agitation and decreased concentration. The patient is nervous/anxious.     Today's Vitals   07/22/19 1559  BP: (!) 148/82  Pulse: 63  Resp: 16  SpO2: 96%  Weight: 248 lb (112.5 kg)  Height: 5\' 7"  (1.702 m)   Body mass index is 38.84 kg/m.  Physical Exam Vitals signs and nursing note reviewed.  Constitutional:      General: She is not in acute distress.    Appearance: Normal appearance. She is well-developed. She is obese. She is not diaphoretic.  HENT:     Head: Normocephalic and atraumatic.     Nose: Nose normal.     Mouth/Throat:     Pharynx: No oropharyngeal exudate.  Eyes:     Extraocular Movements: Extraocular movements intact.     Pupils: Pupils are equal, round, and reactive to light.  Neck:     Musculoskeletal: Normal range of motion and neck supple.     Thyroid: No thyromegaly.     Vascular: No JVD.     Trachea: No tracheal deviation.  Cardiovascular:     Rate and Rhythm: Normal rate and regular rhythm.     Heart sounds: Normal heart sounds. No murmur. No friction rub. No gallop.   Pulmonary:      Effort: Pulmonary effort is normal. No respiratory distress.     Breath sounds: Normal breath sounds. No wheezing or rales.  Chest:     Chest wall: No tenderness.  Abdominal:     Palpations: Abdomen is soft.  Musculoskeletal: Normal range of motion.  Lymphadenopathy:     Cervical: No cervical adenopathy.  Skin:    General: Skin is warm and dry.  Neurological:     Mental Status: She is alert and oriented to  person, place, and time.     Cranial Nerves: No cranial nerve deficit.  Psychiatric:        Behavior: Behavior normal.        Thought Content: Thought content normal.        Judgment: Judgment normal.   Assessment/Plan: 1. Essential (primary) hypertension Improved. Continue bisoprolol at current dose along with irbesartan. Will monitor closely.   General Counseling: antje raffo understanding of the findings of todays visit and agrees with plan of treatment. I have discussed any further diagnostic evaluation that may be needed or ordered today. We also reviewed her medications today. she has been encouraged to call the office with any questions or concerns that should arise related to todays visit.  Hypertension Counseling:   The following hypertensive lifestyle modification were recommended and discussed:  1. Limiting alcohol intake to less than 1 oz/day of ethanol:(24 oz of beer or 8 oz of wine or 2 oz of 100-proof whiskey). 2. Take baby ASA 81 mg daily. 3. Importance of regular aerobic exercise and losing weight. 4. Reduce dietary saturated fat and cholesterol intake for overall cardiovascular health. 5. Maintaining adequate dietary potassium, calcium, and magnesium intake. 6. Regular monitoring of the blood pressure. 7. Reduce sodium intake to less than 100 mmol/day (less than 2.3 gm of sodium or less than 6 gm of sodium choride)    This patient was seen by [place] with Dr Lavera Guise as a part of collaborative care agreement  Time spent: 30  Minutes      Dr Lavera Guise Internal medicine

## 2019-07-23 ENCOUNTER — Other Ambulatory Visit: Payer: Self-pay

## 2019-07-23 ENCOUNTER — Other Ambulatory Visit
Admission: RE | Admit: 2019-07-23 | Discharge: 2019-07-23 | Disposition: A | Discharge status: Home / Self Care | Payer: Managed Care, Other (non HMO) | Source: Ambulatory Visit | Attending: Orthopedic Surgery | Admitting: Orthopedic Surgery

## 2019-07-23 DIAGNOSIS — Z01812 Encounter for preprocedural laboratory examination: Secondary | ICD-10-CM | POA: Insufficient documentation

## 2019-07-23 DIAGNOSIS — Z20828 Contact with and (suspected) exposure to other viral communicable diseases: Secondary | ICD-10-CM | POA: Insufficient documentation

## 2019-07-23 LAB — SARS CORONAVIRUS 2 (TAT 6-24 HRS): SARS Coronavirus 2: NEGATIVE

## 2019-07-24 IMAGING — CT CT OF THE LEFT KNEE WITHOUT CONTRAST
3 of 8 series · 8 of 33 positions shown, 9 images · non-contrast
Comparison: Plain films left knee 07/31/2006.

CLINICAL DATA: Patient for left knee replacement. Preoperative
planning examination.

EXAM:
CT OF THE LEFT KNEE WITHOUT CONTRAST
TECHNIQUE: Multidetector CT imaging of the left knee was performed according to
the standard protocol. Multiplanar CT image reconstructions were
also generated. Axial imaging only of the left hip and ankle was
also obtained.

[Series 3: axial bone knee · axial · 0.24mm/px · z∈[+976,+1147]mm · 2 of 173 slices shown, 3 images]
[im 44/173  soft-tissue]
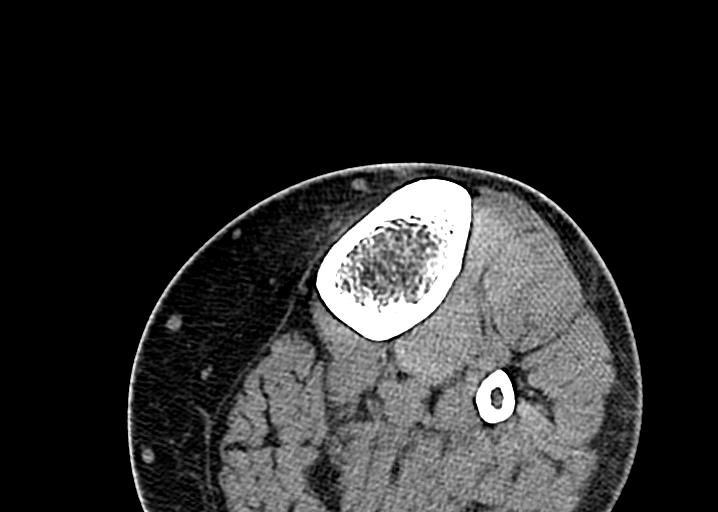
[im 44/173  bone]
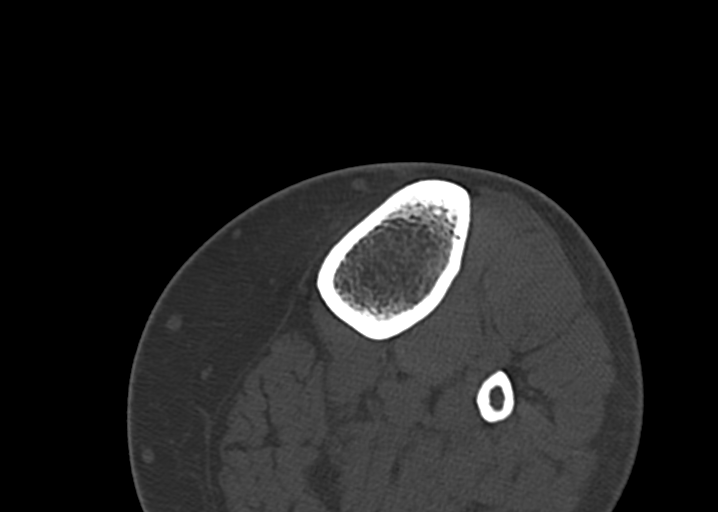
[im 130/173  bone]
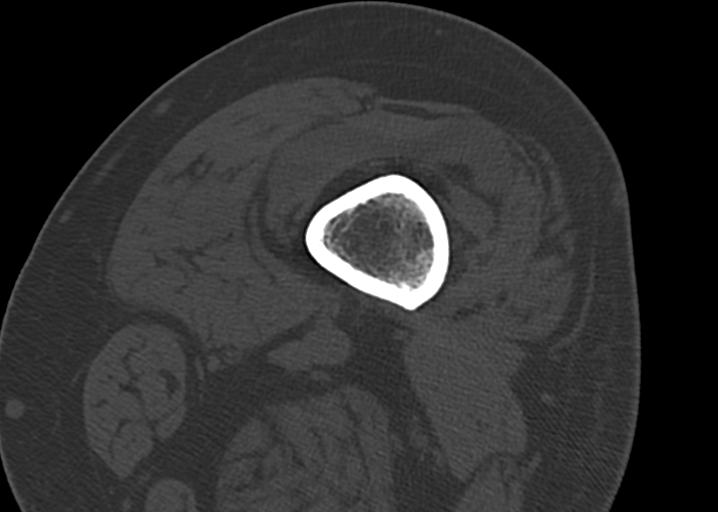

[Series 7: coronal bone knee · coronal · 0.34mm/px · 1 of 62 slices shown]
[im 31/62  bone]
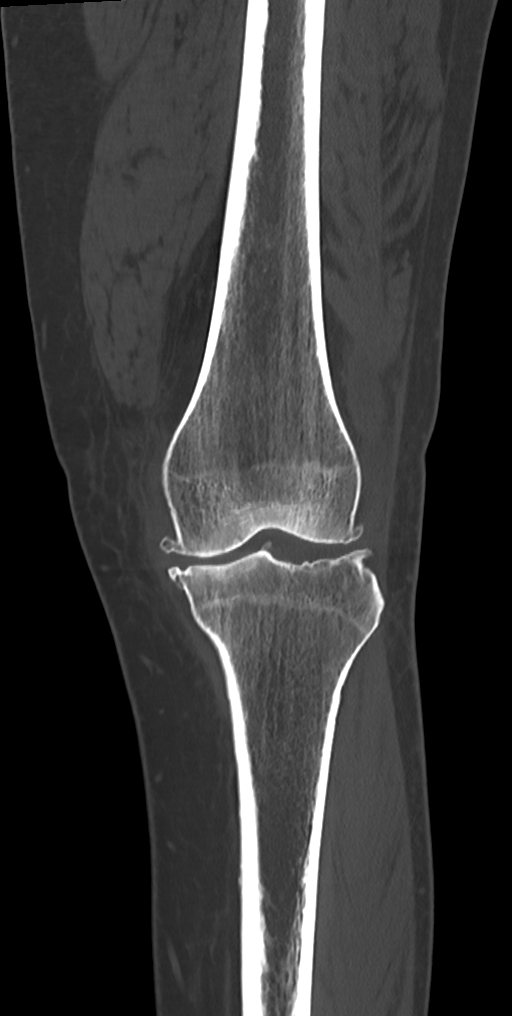

[Series 11: sagittal bone knee · sagittal · 0.24mm/px · 5 of 87 slices shown]
[im 15/87  bone]
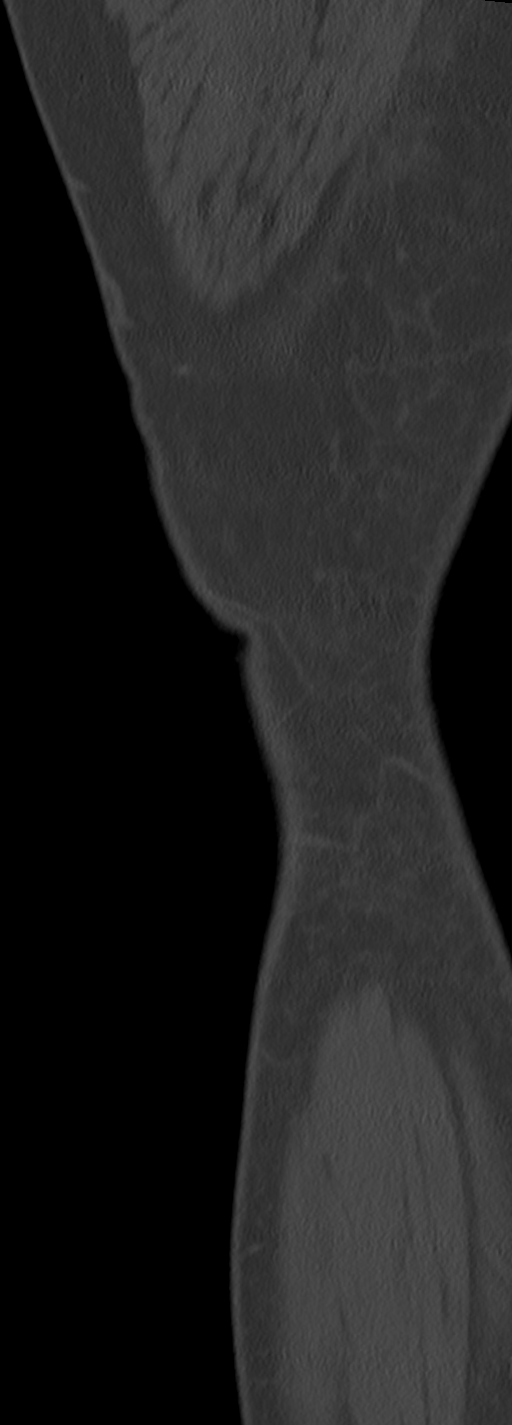
[im 29/87  bone]
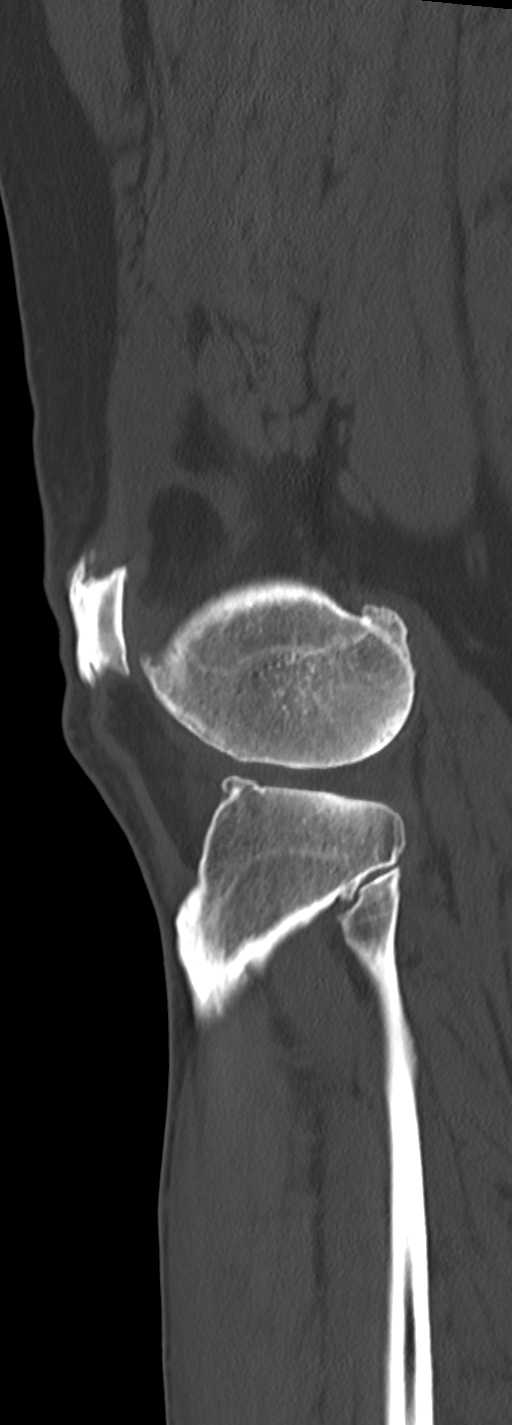
[im 44/87  bone]
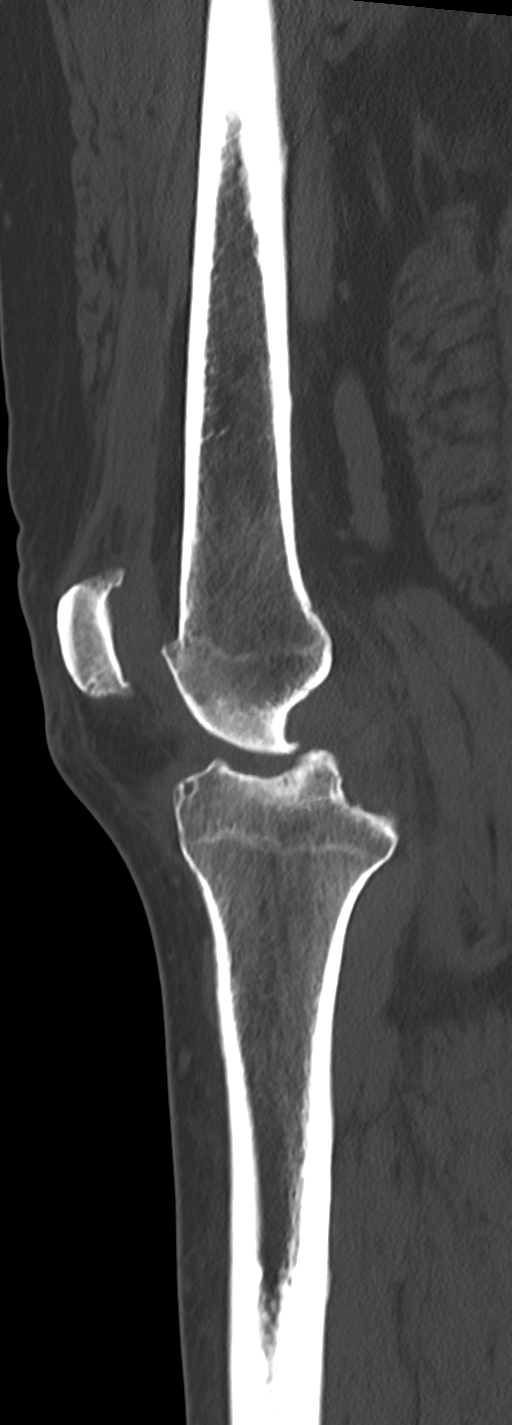
[im 58/87  bone]
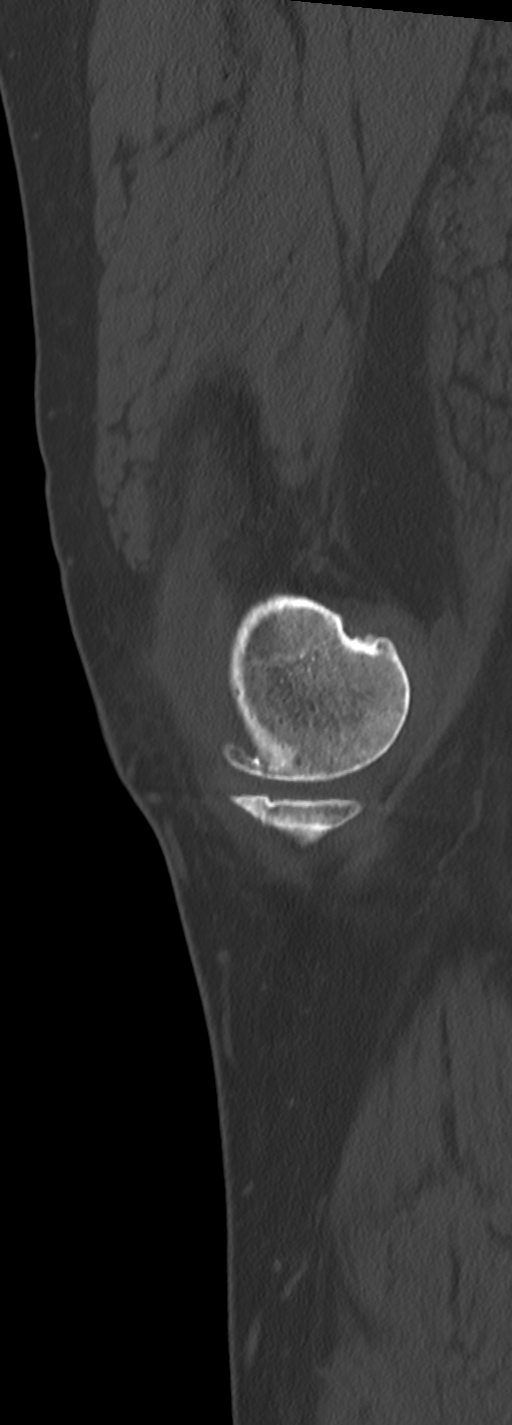
[im 72/87  bone]
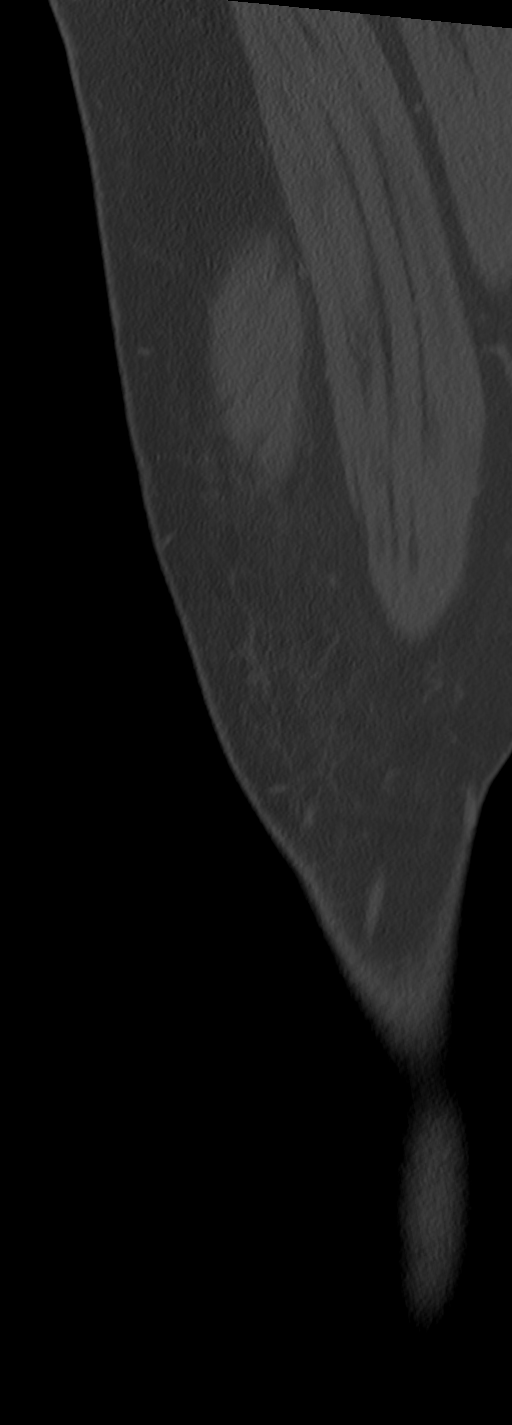

[8 of 33 positions shown; findings below may reference images not displayed]

FINDINGS: Bones/Joint/Cartilage

The left hip appears normal. Mild degenerative disease about the
left SI joint and symphysis pubis noted. Left ankle is unremarkable.

The patient has degenerative disease about the knee. Joint space
narrowing and osteophytosis are worst medially. No acute or focal
bony abnormality is identified.

Ligaments

Suboptimally assessed by CT. The cruciate and collateral ligaments
appear intact.

Muscles and Tendons

No acute or focal abnormality.

Soft tissues

Small joint effusion.
IMPRESSION: Advanced left knee osteoarthritis.  No acute finding.

## 2019-07-26 MED ORDER — TRANEXAMIC ACID-NACL 1000-0.7 MG/100ML-% IV SOLN
1000.0000 mg | INTRAVENOUS | Status: DC
  Filled 2019-07-26: qty 100

## 2019-07-27 ENCOUNTER — Inpatient Hospital Stay
Admission: RE | Admit: 2019-07-27 | Discharge: 2019-07-30 | DRG: 470 | Disposition: A | Discharge status: Home Health | Payer: Managed Care, Other (non HMO) | Attending: Orthopedic Surgery | Admitting: Orthopedic Surgery

## 2019-07-27 ENCOUNTER — Encounter
Admission: RE | Disposition: A | Discharge status: Home Health | Payer: Self-pay | Source: Home / Self Care | Attending: Orthopedic Surgery

## 2019-07-27 ENCOUNTER — Encounter: Payer: Self-pay | Admitting: *Deleted

## 2019-07-27 ENCOUNTER — Inpatient Hospital Stay: Payer: Managed Care, Other (non HMO) | Admitting: Anesthesiology

## 2019-07-27 ENCOUNTER — Other Ambulatory Visit: Payer: Self-pay

## 2019-07-27 ENCOUNTER — Inpatient Hospital Stay: Payer: Managed Care, Other (non HMO)

## 2019-07-27 DIAGNOSIS — K439 Ventral hernia without obstruction or gangrene: Secondary | ICD-10-CM | POA: Diagnosis present

## 2019-07-27 DIAGNOSIS — G8918 Other acute postprocedural pain: Secondary | ICD-10-CM

## 2019-07-27 DIAGNOSIS — Z20828 Contact with and (suspected) exposure to other viral communicable diseases: Secondary | ICD-10-CM | POA: Diagnosis present

## 2019-07-27 DIAGNOSIS — G4730 Sleep apnea, unspecified: Secondary | ICD-10-CM | POA: Diagnosis present

## 2019-07-27 DIAGNOSIS — Z6838 Body mass index (BMI) 38.0-38.9, adult: Secondary | ICD-10-CM | POA: Diagnosis not present

## 2019-07-27 DIAGNOSIS — I10 Essential (primary) hypertension: Secondary | ICD-10-CM | POA: Diagnosis present

## 2019-07-27 DIAGNOSIS — M1712 Unilateral primary osteoarthritis, left knee: Principal | ICD-10-CM | POA: Diagnosis present

## 2019-07-27 DIAGNOSIS — E669 Obesity, unspecified: Secondary | ICD-10-CM | POA: Diagnosis present

## 2019-07-27 DIAGNOSIS — F5102 Adjustment insomnia: Secondary | ICD-10-CM | POA: Diagnosis present

## 2019-07-27 DIAGNOSIS — F419 Anxiety disorder, unspecified: Secondary | ICD-10-CM | POA: Diagnosis present

## 2019-07-27 DIAGNOSIS — Z96652 Presence of left artificial knee joint: Secondary | ICD-10-CM

## 2019-07-27 HISTORY — PX: TOTAL KNEE ARTHROPLASTY: SHX125

## 2019-07-27 HISTORY — PX: APPLICATION OF WOUND VAC: SHX5189

## 2019-07-27 LAB — CBC
HCT: 41.9 % (ref 36.0–46.0)
Hemoglobin: 13.6 g/dL (ref 12.0–15.0)
MCH: 27.3 pg (ref 26.0–34.0)
MCHC: 32.5 g/dL (ref 30.0–36.0)
MCV: 84 fL (ref 80.0–100.0)
Platelets: 127 10*3/uL — ABNORMAL LOW (ref 150–400)
RBC: 4.99 MIL/uL (ref 3.87–5.11)
RDW: 15.5 % (ref 11.5–15.5)
WBC: 9.3 10*3/uL (ref 4.0–10.5)
nRBC: 0 % (ref 0.0–0.2)

## 2019-07-27 LAB — ABO/RH: ABO/RH(D): O POS

## 2019-07-27 LAB — CREATININE, SERUM
Creatinine, Ser: 0.71 mg/dL (ref 0.44–1.00)
GFR calc Af Amer: >60 mL/min (ref >60)
GFR calc non Af Amer: >60 mL/min (ref >60)

## 2019-07-27 SURGERY — ARTHROPLASTY, KNEE, TOTAL
Anesthesia: Spinal | Site: Knee | Laterality: Left

## 2019-07-27 MED ORDER — MIDAZOLAM HCL 2 MG/2ML IJ SOLN
INTRAMUSCULAR | Status: AC
  Filled 2019-07-27: qty 2

## 2019-07-27 MED ORDER — ONDANSETRON HCL 4 MG/2ML IJ SOLN: 4.0000 mg | Freq: Once | INTRAMUSCULAR | Status: DC | PRN

## 2019-07-27 MED ORDER — MAGNESIUM HYDROXIDE 400 MG/5ML PO SUSP
30.0000 mL | Freq: Every day | ORAL | Status: DC | PRN
  Administered 2019-07-29: 30 mL via ORAL
  Filled 2019-07-27 (×2): qty 30

## 2019-07-27 MED ORDER — ACETAMINOPHEN 10 MG/ML IV SOLN
INTRAVENOUS | Status: DC | PRN
  Administered 2019-07-27: 1000 mg via INTRAVENOUS

## 2019-07-27 MED ORDER — CEFAZOLIN SODIUM-DEXTROSE 2-4 GM/100ML-% IV SOLN
2.0000 g | INTRAVENOUS | Status: AC
  Administered 2019-07-27: 2 g via INTRAVENOUS

## 2019-07-27 MED ORDER — KETOROLAC TROMETHAMINE 30 MG/ML IJ SOLN
INTRAMUSCULAR | Status: DC | PRN
  Administered 2019-07-27: 30 mg via INTRAVENOUS

## 2019-07-27 MED ORDER — SUCCINYLCHOLINE CHLORIDE 20 MG/ML IJ SOLN
INTRAMUSCULAR | Status: DC | PRN
  Administered 2019-07-27: 120 mg via INTRAVENOUS

## 2019-07-27 MED ORDER — DIPHENHYDRAMINE HCL 12.5 MG/5ML PO ELIX: 12.5000 mg | ORAL_SOLUTION | ORAL | Status: DC | PRN

## 2019-07-27 MED ORDER — OXYCODONE HCL 5 MG PO TABS
10.0000 mg | ORAL_TABLET | ORAL | Status: DC | PRN
  Administered 2019-07-27 – 2019-07-30 (×8): 15 mg via ORAL
  Filled 2019-07-27 (×8): qty 3

## 2019-07-27 MED ORDER — ONDANSETRON HCL 4 MG/2ML IJ SOLN
INTRAMUSCULAR | Status: DC | PRN
  Administered 2019-07-27: 4 mg via INTRAVENOUS

## 2019-07-27 MED ORDER — IRBESARTAN 150 MG PO TABS
300.0000 mg | ORAL_TABLET | Freq: Every day | ORAL | Status: DC
  Administered 2019-07-28 – 2019-07-30 (×3): 300 mg via ORAL
  Filled 2019-07-27 (×3): qty 2

## 2019-07-27 MED ORDER — FAMOTIDINE 20 MG PO TABS
ORAL_TABLET | ORAL | Status: AC
  Administered 2019-07-27: 20 mg via ORAL
  Filled 2019-07-27: qty 1

## 2019-07-27 MED ORDER — MAGNESIUM CITRATE PO SOLN
1.0000 | Freq: Once | ORAL | Status: DC | PRN
  Filled 2019-07-27: qty 296

## 2019-07-27 MED ORDER — METOCLOPRAMIDE HCL 10 MG PO TABS: 5.0000 mg | ORAL_TABLET | Freq: Three times a day (TID) | ORAL | Status: DC | PRN

## 2019-07-27 MED ORDER — FAMOTIDINE 20 MG PO TABS
20.0000 mg | ORAL_TABLET | Freq: Once | ORAL | Status: AC
  Administered 2019-07-27: 07:00:00 20 mg via ORAL

## 2019-07-27 MED ORDER — ALPRAZOLAM 0.25 MG PO TABS: 0.2500 mg | ORAL_TABLET | Freq: Every evening | ORAL | Status: DC | PRN

## 2019-07-27 MED ORDER — ENOXAPARIN SODIUM 30 MG/0.3ML ~~LOC~~ SOLN
30.0000 mg | Freq: Two times a day (BID) | SUBCUTANEOUS | Status: DC
  Administered 2019-07-28 – 2019-07-30 (×5): 30 mg via SUBCUTANEOUS
  Filled 2019-07-27 (×5): qty 0.3

## 2019-07-27 MED ORDER — NEOMYCIN-POLYMYXIN B GU 40-200000 IR SOLN
Status: DC | PRN
  Administered 2019-07-27: 16 mL

## 2019-07-27 MED ORDER — ALUM & MAG HYDROXIDE-SIMETH 200-200-20 MG/5ML PO SUSP: 30.0000 mL | ORAL | Status: DC | PRN

## 2019-07-27 MED ORDER — OXYCODONE HCL 5 MG PO TABS
5.0000 mg | ORAL_TABLET | ORAL | Status: DC | PRN
  Administered 2019-07-27 – 2019-07-29 (×3): 10 mg via ORAL
  Filled 2019-07-27 (×3): qty 2

## 2019-07-27 MED ORDER — FENTANYL CITRATE (PF) 100 MCG/2ML IJ SOLN
25.0000 ug | INTRAMUSCULAR | Status: DC | PRN
  Administered 2019-07-27 (×4): 25 ug via INTRAVENOUS

## 2019-07-27 MED ORDER — DOCUSATE SODIUM 100 MG PO CAPS
100.0000 mg | ORAL_CAPSULE | Freq: Two times a day (BID) | ORAL | Status: DC
  Administered 2019-07-27 – 2019-07-30 (×6): 100 mg via ORAL
  Filled 2019-07-27 (×6): qty 1

## 2019-07-27 MED ORDER — CHLORHEXIDINE GLUCONATE 4 % EX LIQD: 60.0000 mL | Freq: Once | CUTANEOUS | Status: DC

## 2019-07-27 MED ORDER — TRAMADOL HCL 50 MG PO TABS
50.0000 mg | ORAL_TABLET | Freq: Four times a day (QID) | ORAL | Status: DC
  Administered 2019-07-27 – 2019-07-30 (×11): 50 mg via ORAL
  Filled 2019-07-27 (×11): qty 1

## 2019-07-27 MED ORDER — METOCLOPRAMIDE HCL 5 MG/ML IJ SOLN: 5.0000 mg | Freq: Three times a day (TID) | INTRAMUSCULAR | Status: DC | PRN

## 2019-07-27 MED ORDER — METHOCARBAMOL 500 MG PO TABS
500.0000 mg | ORAL_TABLET | Freq: Four times a day (QID) | ORAL | Status: DC | PRN
  Administered 2019-07-27 – 2019-07-29 (×2): 500 mg via ORAL
  Filled 2019-07-27 (×2): qty 1

## 2019-07-27 MED ORDER — SODIUM CHLORIDE 0.9 % IV SOLN
INTRAVENOUS | Status: DC | PRN
  Administered 2019-07-27: 20 ug/min via INTRAVENOUS

## 2019-07-27 MED ORDER — MIDAZOLAM HCL 5 MG/5ML IJ SOLN
INTRAMUSCULAR | Status: DC | PRN
  Administered 2019-07-27: 2 mg via INTRAVENOUS

## 2019-07-27 MED ORDER — EPINEPHRINE PF 1 MG/ML IJ SOLN
INTRAMUSCULAR | Status: AC
  Filled 2019-07-27: qty 1

## 2019-07-27 MED ORDER — ONDANSETRON HCL 4 MG/2ML IJ SOLN: 4.0000 mg | Freq: Four times a day (QID) | INTRAMUSCULAR | Status: DC | PRN

## 2019-07-27 MED ORDER — ACETAMINOPHEN 325 MG PO TABS
325.0000 mg | ORAL_TABLET | Freq: Four times a day (QID) | ORAL | Status: DC | PRN
  Administered 2019-07-30: 650 mg via ORAL
  Filled 2019-07-27: qty 2

## 2019-07-27 MED ORDER — METHOCARBAMOL 1000 MG/10ML IJ SOLN
500.0000 mg | Freq: Four times a day (QID) | INTRAVENOUS | Status: DC | PRN
  Filled 2019-07-27: qty 5

## 2019-07-27 MED ORDER — MORPHINE SULFATE (PF) 10 MG/ML IV SOLN
INTRAVENOUS | Status: AC
  Filled 2019-07-27: qty 1

## 2019-07-27 MED ORDER — ROCURONIUM BROMIDE 100 MG/10ML IV SOLN
INTRAVENOUS | Status: DC | PRN
  Administered 2019-07-27: 10 mg via INTRAVENOUS
  Administered 2019-07-27: 40 mg via INTRAVENOUS

## 2019-07-27 MED ORDER — HYDROMORPHONE HCL 1 MG/ML IJ SOLN
0.5000 mg | INTRAMUSCULAR | Status: DC | PRN
  Administered 2019-07-28: 0.5 mg via INTRAVENOUS
  Administered 2019-07-29: 1 mg via INTRAVENOUS
  Filled 2019-07-27 (×2): qty 1

## 2019-07-27 MED ORDER — BISOPROLOL FUMARATE 5 MG PO TABS
5.0000 mg | ORAL_TABLET | Freq: Every day | ORAL | Status: DC
  Administered 2019-07-29: 5 mg via ORAL
  Filled 2019-07-27 (×3): qty 1

## 2019-07-27 MED ORDER — PANTOPRAZOLE SODIUM 40 MG PO TBEC
40.0000 mg | DELAYED_RELEASE_TABLET | Freq: Every day | ORAL | Status: DC
  Administered 2019-07-28 – 2019-07-30 (×3): 40 mg via ORAL
  Filled 2019-07-27 (×3): qty 1

## 2019-07-27 MED ORDER — FENTANYL CITRATE (PF) 100 MCG/2ML IJ SOLN
INTRAMUSCULAR | Status: AC
  Filled 2019-07-27: qty 2

## 2019-07-27 MED ORDER — MENTHOL 3 MG MT LOZG
1.0000 | LOZENGE | OROMUCOSAL | Status: DC | PRN
  Filled 2019-07-27: qty 9

## 2019-07-27 MED ORDER — SODIUM CHLORIDE (PF) 0.9 % IJ SOLN
INTRAMUSCULAR | Status: DC | PRN
  Administered 2019-07-27: 30 mL via INTRAVENOUS

## 2019-07-27 MED ORDER — ZOLPIDEM TARTRATE 5 MG PO TABS: 5.0000 mg | ORAL_TABLET | Freq: Every evening | ORAL | Status: DC | PRN

## 2019-07-27 MED ORDER — LIDOCAINE HCL (CARDIAC) PF 100 MG/5ML IV SOSY
PREFILLED_SYRINGE | INTRAVENOUS | Status: DC | PRN
  Administered 2019-07-27: 100 mg via INTRAVENOUS

## 2019-07-27 MED ORDER — SODIUM CHLORIDE FLUSH 0.9 % IV SOLN
INTRAVENOUS | Status: AC
  Filled 2019-07-27: qty 120

## 2019-07-27 MED ORDER — SEVOFLURANE IN SOLN
RESPIRATORY_TRACT | Status: AC
  Filled 2019-07-27: qty 250

## 2019-07-27 MED ORDER — BUPIVACAINE-EPINEPHRINE (PF) 0.25% -1:200000 IJ SOLN
INTRAMUSCULAR | Status: DC | PRN
  Administered 2019-07-27: 30 mL via PERINEURAL

## 2019-07-27 MED ORDER — ACETAMINOPHEN 500 MG PO TABS
1000.0000 mg | ORAL_TABLET | Freq: Four times a day (QID) | ORAL | Status: AC
  Administered 2019-07-27 – 2019-07-28 (×4): 1000 mg via ORAL
  Filled 2019-07-27 (×4): qty 2

## 2019-07-27 MED ORDER — DEXAMETHASONE SODIUM PHOSPHATE 10 MG/ML IJ SOLN
INTRAMUSCULAR | Status: DC | PRN
  Administered 2019-07-27: 10 mg via INTRAVENOUS

## 2019-07-27 MED ORDER — FENTANYL CITRATE (PF) 100 MCG/2ML IJ SOLN
INTRAMUSCULAR | Status: DC | PRN
  Administered 2019-07-27 (×4): 50 ug via INTRAVENOUS

## 2019-07-27 MED ORDER — SODIUM CHLORIDE 0.9 % IV SOLN
INTRAVENOUS | Status: DC | PRN
  Administered 2019-07-27: 60 mL

## 2019-07-27 MED ORDER — BUPIVACAINE LIPOSOME 1.3 % IJ SUSP
INTRAMUSCULAR | Status: AC
  Filled 2019-07-27: qty 60

## 2019-07-27 MED ORDER — PHENYLEPHRINE HCL (PRESSORS) 10 MG/ML IV SOLN
INTRAVENOUS | Status: DC | PRN
  Administered 2019-07-27 (×4): 100 ug via INTRAVENOUS

## 2019-07-27 MED ORDER — POTASSIUM CHLORIDE CRYS ER 20 MEQ PO TBCR
20.0000 meq | EXTENDED_RELEASE_TABLET | Freq: Two times a day (BID) | ORAL | Status: DC
  Administered 2019-07-27 – 2019-07-30 (×6): 20 meq via ORAL
  Filled 2019-07-27 (×6): qty 1

## 2019-07-27 MED ORDER — ONDANSETRON HCL 4 MG PO TABS: 4.0000 mg | ORAL_TABLET | Freq: Four times a day (QID) | ORAL | Status: DC | PRN

## 2019-07-27 MED ORDER — CEFAZOLIN SODIUM-DEXTROSE 2-4 GM/100ML-% IV SOLN
2.0000 g | Freq: Four times a day (QID) | INTRAVENOUS | Status: AC
  Administered 2019-07-27 (×3): 2 g via INTRAVENOUS
  Filled 2019-07-27 (×3): qty 100

## 2019-07-27 MED ORDER — BISACODYL 10 MG RE SUPP
10.0000 mg | Freq: Every day | RECTAL | Status: DC | PRN
  Administered 2019-07-30: 10 mg via RECTAL
  Filled 2019-07-27: qty 1

## 2019-07-27 MED ORDER — PHENOL 1.4 % MT LIQD
1.0000 | OROMUCOSAL | Status: DC | PRN
  Filled 2019-07-27: qty 177

## 2019-07-27 MED ORDER — GLYCOPYRROLATE 0.2 MG/ML IJ SOLN
INTRAMUSCULAR | Status: DC | PRN
  Administered 2019-07-27: 0.2 mg via INTRAVENOUS

## 2019-07-27 MED ORDER — PROPOFOL 10 MG/ML IV BOLUS
INTRAVENOUS | Status: DC | PRN
  Administered 2019-07-27: 200 mg via INTRAVENOUS

## 2019-07-27 MED ORDER — HYDROCHLOROTHIAZIDE 25 MG PO TABS
25.0000 mg | ORAL_TABLET | Freq: Every day | ORAL | Status: DC
  Administered 2019-07-29: 25 mg via ORAL
  Filled 2019-07-27: qty 1

## 2019-07-27 MED ORDER — CEFAZOLIN SODIUM-DEXTROSE 2-4 GM/100ML-% IV SOLN
INTRAVENOUS | Status: AC
  Filled 2019-07-27: qty 100

## 2019-07-27 MED ORDER — ACETAMINOPHEN 10 MG/ML IV SOLN
INTRAVENOUS | Status: AC
  Filled 2019-07-27: qty 100

## 2019-07-27 MED ORDER — MORPHINE SULFATE 10 MG/ML IJ SOLN
INTRAMUSCULAR | Status: DC | PRN
  Administered 2019-07-27: 10 mg via INTRAVENOUS

## 2019-07-27 MED ORDER — CALCITRIOL 0.25 MCG PO CAPS
0.2500 ug | ORAL_CAPSULE | Freq: Every day | ORAL | Status: DC
  Administered 2019-07-28 – 2019-07-29 (×2): 0.25 ug via ORAL
  Filled 2019-07-27 (×4): qty 1

## 2019-07-27 MED ORDER — PROPOFOL 500 MG/50ML IV EMUL
INTRAVENOUS | Status: AC
  Filled 2019-07-27: qty 50

## 2019-07-27 MED ORDER — SUGAMMADEX SODIUM 200 MG/2ML IV SOLN
INTRAVENOUS | Status: DC | PRN
  Administered 2019-07-27: 224 mg via INTRAVENOUS

## 2019-07-27 MED ORDER — LACTATED RINGERS IV SOLN
INTRAVENOUS | Status: DC
  Administered 2019-07-27: 07:00:00 via INTRAVENOUS

## 2019-07-27 MED ORDER — SODIUM CHLORIDE 0.9 % IV SOLN: INTRAVENOUS | Status: DC

## 2019-07-27 MED ORDER — BUPIVACAINE HCL (PF) 0.25 % IJ SOLN
INTRAMUSCULAR | Status: AC
  Filled 2019-07-27: qty 90

## 2019-07-27 SURGICAL SUPPLY — 72 items
BLADE SAGITTAL 25.0X1.19X90 (BLADE) ×2 IMPLANT
BLOCK CUTTING FEMUR 4 LT MED (MISCELLANEOUS) ×2 IMPLANT
BLOCK CUTTING TIBIAL 3 LT (MISCELLANEOUS) ×2 IMPLANT
BNDG ELASTIC 6X5.8 VLCR STR LF (GAUZE/BANDAGES/DRESSINGS) ×2 IMPLANT
CANISTER SUCT 1200ML W/VALVE (MISCELLANEOUS) ×2 IMPLANT
CANISTER SUCT 3000ML PPV (MISCELLANEOUS) ×4 IMPLANT
CANISTER WOUND CARE 500ML ATS (WOUND CARE) ×2 IMPLANT
CEMENT HV SMART SET (Cement) ×4 IMPLANT
CHLORAPREP W/TINT 26 (MISCELLANEOUS) ×4 IMPLANT
COOLER ICEMAN CLASSIC (MISCELLANEOUS) ×2 IMPLANT
COVER WAND RF STERILE (DRAPES) ×2 IMPLANT
CUFF TOURN SGL QUICK 24 (TOURNIQUET CUFF)
CUFF TOURN SGL QUICK 30 (TOURNIQUET CUFF)
CUFF TRNQT CYL 24X4X16.5-23 (TOURNIQUET CUFF) IMPLANT
CUFF TRNQT CYL 30X4X21-28X (TOURNIQUET CUFF) IMPLANT
DRAPE 3/4 80X56 (DRAPES) ×4 IMPLANT
ELECT CAUTERY BLADE 6.4 (BLADE) ×2 IMPLANT
ELECT REM PT RETURN 9FT ADLT (ELECTROSURGICAL) ×2
ELECTRODE REM PT RTRN 9FT ADLT (ELECTROSURGICAL) ×1 IMPLANT
FEMORAL COMP SZ4 LEFT SPHERE (Femur) ×2 IMPLANT
FEMUR BONE MODEL 4.9010 MEDACT (MISCELLANEOUS) ×2 IMPLANT
GAUZE SPONGE 4X4 12PLY STRL (GAUZE/BANDAGES/DRESSINGS) ×2 IMPLANT
GAUZE XEROFORM 1X8 LF (GAUZE/BANDAGES/DRESSINGS) ×2 IMPLANT
GLOVE BIOGEL PI IND STRL 9 (GLOVE) ×1 IMPLANT
GLOVE BIOGEL PI INDICATOR 9 (GLOVE) ×1
GLOVE INDICATOR 8.0 STRL GRN (GLOVE) ×2 IMPLANT
GLOVE SURG ORTHO 8.0 STRL STRW (GLOVE) ×2 IMPLANT
GLOVE SURG SYN 9.0  PF PI (GLOVE) ×1
GLOVE SURG SYN 9.0 PF PI (GLOVE) ×1 IMPLANT
GOWN SRG 2XL LVL 4 RGLN SLV (GOWNS) ×1 IMPLANT
GOWN STRL NON-REIN 2XL LVL4 (GOWNS) ×1
GOWN STRL REUS W/ TWL LRG LVL3 (GOWN DISPOSABLE) ×1 IMPLANT
GOWN STRL REUS W/ TWL XL LVL3 (GOWN DISPOSABLE) ×1 IMPLANT
GOWN STRL REUS W/TWL LRG LVL3 (GOWN DISPOSABLE) ×1
GOWN STRL REUS W/TWL XL LVL3 (GOWN DISPOSABLE) ×1
HOLDER FOLEY CATH W/STRAP (MISCELLANEOUS) ×2 IMPLANT
HOOD PEEL AWAY FLYTE STAYCOOL (MISCELLANEOUS) ×6 IMPLANT
KIT PREVENA INCISION MGT20CM45 (CANNISTER) ×2 IMPLANT
KIT TURNOVER KIT A (KITS) ×2 IMPLANT
NDL SAFETY ECLIPSE 18X1.5 (NEEDLE) ×1 IMPLANT
NEEDLE HYPO 18GX1.5 SHARP (NEEDLE) ×1
NEEDLE SPNL 18GX3.5 QUINCKE PK (NEEDLE) ×2 IMPLANT
NEEDLE SPNL 20GX3.5 QUINCKE YW (NEEDLE) ×2 IMPLANT
NS IRRIG 1000ML POUR BTL (IV SOLUTION) ×2 IMPLANT
PACK TOTAL KNEE (MISCELLANEOUS) ×2 IMPLANT
PAD COLD SHLDR UNI WRAP-ON (PAD) ×2
PAD COLD UNI WRAP-ON (PAD) ×1 IMPLANT
PAD WRAPON POLAR KNEE (MISCELLANEOUS) IMPLANT
PATELLA RESURFACING MEDACTA 02 (Bone Implant) ×2 IMPLANT
PENCIL SMOKE EVACUATOR COATED (MISCELLANEOUS) ×2 IMPLANT
PULSAVAC PLUS IRRIG FAN TIP (DISPOSABLE) ×2
SCALPEL PROTECTED #10 DISP (BLADE) ×4 IMPLANT
SOL .9 NS 3000ML IRR  AL (IV SOLUTION) ×1
SOL .9 NS 3000ML IRR UROMATIC (IV SOLUTION) ×1 IMPLANT
STAPLER SKIN PROX 35W (STAPLE) ×2 IMPLANT
STEM EXTENSION 11MMX30MM (Stem) ×2 IMPLANT
SUCTION FRAZIER HANDLE 10FR (MISCELLANEOUS) ×1
SUCTION TUBE FRAZIER 10FR DISP (MISCELLANEOUS) ×1 IMPLANT
SUT DVC 2 QUILL PDO  T11 36X36 (SUTURE) ×1
SUT DVC 2 QUILL PDO T11 36X36 (SUTURE) ×1 IMPLANT
SUT ETHIBOND 2 V 37 (SUTURE) ×2 IMPLANT
SUT V-LOC 90 ABS DVC 3-0 CL (SUTURE) ×2 IMPLANT
SYR 20ML LL LF (SYRINGE) ×2 IMPLANT
SYR 50ML LL SCALE MARK (SYRINGE) ×4 IMPLANT
TIBIAL BONE MODEL LEFT (MISCELLANEOUS) ×2 IMPLANT
TIBIAL INSERT SZ4 LT 02120410F (Insert) ×2 IMPLANT
TIP FAN IRRIG PULSAVAC PLUS (DISPOSABLE) ×1 IMPLANT
TOWEL OR 17X26 4PK STRL BLUE (TOWEL DISPOSABLE) ×2 IMPLANT
TOWER CARTRIDGE SMART MIX (DISPOSABLE) ×2 IMPLANT
TRAY FOLEY MTR SLVR 16FR STAT (SET/KITS/TRAYS/PACK) ×2 IMPLANT
TRAY TIBIAL FIXED T3I4 LEFT (Miscellaneous) ×2 IMPLANT
WRAPON POLAR PAD KNEE (MISCELLANEOUS)

## 2019-07-27 NOTE — Anesthesia Post-op Follow-up Note (Signed)
Anesthesia QCDR form completed.        

## 2019-07-27 NOTE — H&P (Signed)
Reviewed paper H+P, will be scanned into chart. No changes noted.  

## 2019-07-27 NOTE — Anesthesia Procedure Notes (Signed)
Procedure Name: Intubation Date/Time: 07/27/2019 8:08 AM Performed by: Nelda Marseille, CRNA Pre-anesthesia Checklist: Patient identified, Patient being monitored, Timeout performed, Emergency Drugs available and Suction available Patient Re-evaluated:Patient Re-evaluated prior to induction Oxygen Delivery Method: Circle system utilized Preoxygenation: Pre-oxygenation with 100% oxygen Induction Type: IV induction Ventilation: Mask ventilation without difficulty Laryngoscope Size: Mac, 3 and Glidescope Grade View: Grade III Tube type: Oral Tube size: 7.0 mm Number of attempts: 1 Airway Equipment and Method: Stylet Placement Confirmation: ETT inserted through vocal cords under direct vision,  positive ETCO2 and breath sounds checked- equal and bilateral Secured at: 21 cm Tube secured with: Tape Dental Injury: Teeth and Oropharynx as per pre-operative assessment

## 2019-07-27 NOTE — Anesthesia Preprocedure Evaluation (Signed)
Anesthesia Evaluation  Patient identified by MRN, date of birth, ID band Patient awake    Reviewed: Allergy & Precautions, NPO status , Patient's Chart, lab work & pertinent test results, reviewed documented beta blocker date and time   History of Anesthesia Complications Negative for: history of anesthetic complications  Airway Mallampati: II       Dental   Pulmonary sleep apnea and Continuous Positive Airway Pressure Ventilation , neg COPD, Not current smoker, former smoker,           Cardiovascular hypertension, Pt. on medications (-) Past MI and (-) CHF (-) dysrhythmias (-) Valvular Problems/Murmurs     Neuro/Psych neg Seizures Anxiety    GI/Hepatic Neg liver ROS, neg GERD  ,  Endo/Other  neg diabetes  Renal/GU negative Renal ROS     Musculoskeletal   Abdominal (+) + obese,   Peds  Hematology  (+) anemia ,   Anesthesia Other Findings   Reproductive/Obstetrics                             Anesthesia Physical Anesthesia Plan  ASA: III  Anesthesia Plan: Spinal   Post-op Pain Management:    Induction:   PONV Risk Score and Plan:   Airway Management Planned:   Additional Equipment:   Intra-op Plan:   Post-operative Plan:   Informed Consent: I have reviewed the patients History and Physical, chart, labs and discussed the procedure including the risks, benefits and alternatives for the proposed anesthesia with the patient or authorized representative who has indicated his/her understanding and acceptance.       Plan Discussed with:   Anesthesia Plan Comments:         Anesthesia Quick Evaluation

## 2019-07-27 NOTE — Transfer of Care (Signed)
Immediate Anesthesia Transfer of Care Note  Patient: Jill Banks  Procedure(s) Performed: LEFT TOTAL KNEE ARTHROPLASTY (Left Knee) APPLICATION OF WOUND VAC (Left Knee)  Patient Location: PACU  Anesthesia Type:General  Level of Consciousness: awake, alert  and oriented  Airway & Oxygen Therapy: Patient Spontanous Breathing and Patient connected to face mask oxygen  Post-op Assessment: Report given to RN and Post -op Vital signs reviewed and stable  Post vital signs: Reviewed and stable  Last Vitals:  Vitals Value Taken Time  BP 145/84 07/27/19 0946  Temp 36.5 C 07/27/19 0946  Pulse 79 07/27/19 0947  Resp 25 07/27/19 0947  SpO2 100 % 07/27/19 0947  Vitals shown include unvalidated device data.  Last Pain:  Vitals:   07/27/19 0639  TempSrc: Tympanic  PainSc: 0-No pain         Complications: No apparent anesthesia complications

## 2019-07-27 NOTE — Progress Notes (Signed)
15 minute call to floor. 

## 2019-07-27 NOTE — Evaluation (Signed)
Physical Therapy Evaluation Patient Details Name: Jill Banks MRN: [id number] DOB: 04-May-1956 Today's Date: 07/27/2019   History of Present Illness  admitted for acute hospitalization status post L TKR, WBAT (07/27/19)  Clinical Impression  Upon evaluation, patient alert and oriented; follows commands, eager for OOB attempts.  Rates pain in L knee 8/10; meds received prior to session.  Demonstrates fair/good post-op strength (3-/5) and ROM (5-70 degrees) to L knee; mild difficulty with isolated activation of quad set, but improves with repetition and functional movement.  Able to complete SLR with minimal difficulty.  Currently requiring min assist for bed mobility; min assist for sit/stand, basic transfers and short-distance gait (5') with RW.  Demonstrates 3-point, step to gait pattern; mod WBing bilat UEs; decreased loading/stance time L LE.  Fair/good L knee control noted, no buckling or LOB.  Patient highly motivated; anticipate consistent progress towards all goals throughout rehab course. Would benefit from skilled PT to address above deficits and promote optimal return to PLOF.; Recommend transition to HHPT upon discharge from acute hospitalization.     Follow Up Recommendations Home health PT    Equipment Recommendations       Recommendations for Other Services       Precautions / Restrictions Precautions Precautions: Fall Restrictions Weight Bearing Restrictions: Yes LLE Weight Bearing: Weight bearing as tolerated      Mobility  Bed Mobility Overal bed mobility: Needs Assistance Bed Mobility: Supine to Sit     Supine to sit: Min assist;Mod assist     General bed mobility comments: assist for LE management  Transfers Overall transfer level: Needs assistance Equipment used: Rolling walker (2 wheeled) Transfers: Sit to/from Stand Sit to Stand: Min assist         General transfer comment: cuing for hand/foot placement  Ambulation/Gait Ambulation/Gait  assistance: Min guard;Min assist Gait Distance (Feet): 5 Feet Assistive device: Rolling walker (2 wheeled)       General Gait Details: 3-point, step to gait pattern; mod WBing bilat UEs; decreased loading/stance time L LE.  Fair/good L knee control noted, no buckling or LOB  Stairs            Wheelchair Mobility    Modified Rankin (Stroke Patients Only)       Balance Overall balance assessment: Needs assistance Sitting-balance support: No upper extremity supported;Feet supported Sitting balance-Leahy Scale: Good     Standing balance support: Bilateral upper extremity supported Standing balance-Leahy Scale: Fair                               Pertinent Vitals/Pain Pain Assessment: 0-10 Pain Score: 8  Pain Location: L knee Pain Descriptors / Indicators: Aching;Guarding;Grimacing Pain Intervention(s): Limited activity within patient's tolerance;Monitored during session;Repositioned    Home Living Family/patient expects to be discharged to:: Private residence Living Arrangements: Spouse/significant other Available Help at Discharge: Family;Available 24 hours/day Type of Home: House Home Access: Level entry     Home Layout: One level;Laundry or work area in [employer]: None      Prior Function Level of Independence: Independent         Comments: Indep without assist device for ADLS, household and community mobilization; denies fall history.     Hand Dominance        Extremity/Trunk Assessment   Upper Extremity Assessment Upper Extremity Assessment: Overall WFL for tasks assessed    Lower Extremity Assessment Lower Extremity Assessment: (L LE grossly 3-/5, mild  difficulty with isolated activation of L quad; sensation fully intact.  R LE grossly WFL)       Communication   Communication: No difficulties  Cognition Arousal/Alertness: Awake/alert Behavior During Therapy: WFL for tasks assessed/performed Overall Cognitive  Status: Within Functional Limits for tasks assessed                                        General Comments      Exercises Total Joint Exercises Goniometric ROM: L knee: 5-70 degrees, approx Other Exercises Other Exercises: Supine L LE therex, 1x10, act assist: ankle pumps, quad sets, SAQs, heel slides, hip abduct/adduct and SLR. Improving quad control and activation with repetition.   Assessment/Plan    PT Assessment Patient needs continued PT services  PT Problem List Decreased strength;Decreased range of motion;Decreased activity tolerance;Decreased balance;Decreased mobility;Decreased knowledge of use of DME;Decreased safety awareness;Decreased knowledge of precautions;Decreased skin integrity;Pain       PT Treatment Interventions DME instruction;Gait training;Functional mobility training;Therapeutic activities;Therapeutic exercise;Balance training;Patient/family education    PT Goals (Current goals can be found in the Care Plan section)  Acute Rehab PT Goals Patient Stated Goal: to do what i have to do to get better PT Goal Formulation: With patient Time For Goal Achievement: 08/10/19 Potential to Achieve Goals: Good    Frequency BID   Barriers to discharge        Co-evaluation               AM-PAC PT "6 Clicks" Mobility  Outcome Measure Help needed turning from your back to your side while in a flat bed without using bedrails?: A Little Help needed moving from lying on your back to sitting on the side of a flat bed without using bedrails?: A Little Help needed moving to and from a bed to a chair (including a wheelchair)?: A Little Help needed standing up from a chair using your arms (e.g., wheelchair or bedside chair)?: A Little Help needed to walk in hospital room?: A Little Help needed climbing 3-5 steps with a railing? : A Little 6 Click Score: 18    End of Session Equipment Utilized During Treatment: Gait belt Activity Tolerance:  Patient tolerated treatment well Patient left: in chair;with call bell/phone within reach;with chair alarm set Nurse Communication: Mobility status PT Visit Diagnosis: Muscle weakness (generalized) (M62.81);Difficulty in walking, not elsewhere classified (R26.2)    Time: 1510-1535 PT Time Calculation (min) (ACUTE ONLY): 25 min   Charges:   PT Evaluation $PT Eval Moderate Complexity: 1 Mod PT Treatments $Therapeutic Exercise: 8-22 mins        Shanah Guimaraes H. Owens Shark, PT, DPT, NCS 07/27/19, 4:13 PM [phone]

## 2019-07-27 NOTE — Anesthesia Postprocedure Evaluation (Signed)
Anesthesia Post Note  Patient: Jill Banks  Procedure(s) Performed: LEFT TOTAL KNEE ARTHROPLASTY (Left Knee) APPLICATION OF WOUND VAC (Left Knee)  Patient location during evaluation: PACU Anesthesia Type: Spinal Level of consciousness: sedated Pain management: pain level controlled Vital Signs Assessment: post-procedure vital signs reviewed and stable Respiratory status: spontaneous breathing and respiratory function stable Cardiovascular status: stable Anesthetic complications: no     Last Vitals:  Vitals:   07/27/19 0639 07/27/19 0946  BP: (!) 184/95 (!) 145/84  Pulse: 70 80  Resp: 16 18  Temp: 36.6 C 36.5 C  SpO2: 99% 98%    Last Pain:  Vitals:   07/27/19 0946  TempSrc:   PainSc: 0-No pain                 KEPHART,WILLIAM K

## 2019-07-27 NOTE — TOC Progression Note (Signed)
Transition of Care [facility]) - Progression Note    Patient Details  Name: Jill Banks MRN: [id number] Date of Birth: [birth year]  Transition of Care [facility]) CM/SW [place], RN Phone Number: 07/27/2019, 3:41 PM  Clinical Narrative:     Requested Lovenox price       Expected Discharge Plan and Services                                                 Social Determinants of Health (SDOH) Interventions    Readmission Risk Interventions No flowsheet data found.

## 2019-07-27 NOTE — Op Note (Signed)
07/27/2019  9:47 AM  PATIENT:  Jill Banks  63 y.o. female  PRE-OPERATIVE DIAGNOSIS:  PRIMARY OSTEOARTHRITIS OF LEFT KNEE  POST-OPERATIVE DIAGNOSIS:  PRIMARY OSTEOARTHRITIS OF LEFT KNEE  PROCEDURE:  Procedure(s) with comments: LEFT TOTAL KNEE ARTHROPLASTY (Left) APPLICATION OF WOUND VAC (Left) - [id number]  SURGEON: Laurene Footman, MD  ASSISTANTS: Rachelle Hora, PA-C  ANESTHESIA:   general  EBL:  Total I/O In: 800 [I.V.:800] Out: 100 [Blood:100]  BLOOD ADMINISTERED:none  DRAINS: none   LOCAL MEDICATIONS USED:  MARCAINE    and OTHER Exparel morphine and Toradol  SPECIMEN:  No Specimen  DISPOSITION OF SPECIMEN:  N/A  COUNTS:  YES  TOURNIQUET:   Total Tourniquet Time Documented: Thigh (Left) - 66 minutes Total: Thigh (Left) - 66 minutes   IMPLANTS: Medacta GM K sphere system, left 4 femur, left 3 TI 4 tibial baseplate, short stem, 10 mm insert and size 2 patella, all components cemented  DICTATION: .Dragon Dictation  patient was brought to the operating room and after attemptedspinalanesthesia was unsuccessful general anesthesia was obtained.  The  leftleg was prepped and draped in the usual sterile fashion. After patient identification and timeout procedures were completed, midline skin incision was made followed by medial parapatellar arthrotomyafter raising tourniquetthere was significant pitting and abnormality of the cartilage consistent with an inflammatory arthropathy. Partial synovectomy was also carried out. The ACL and fat pad were excised off the anterior into the meniscus in the proximal tibia cutting guide from the [facility] was applied and the proximal tibia cut carried out. The distal femoral cut was carried out in a similar fashion.  The 4 femoralcutting guide applied with anterior posterior and chamfer cuts made. The posterior horns of the menisci were removed at this point. The3tibia baseplate trial was placed pinned into position  and proximal tibial preparation carried out with drilling hand reaming and the keel punch followed by placement of the60femur and sizing the tibial insert size 44millimetergave the best fit with stability and full extension. The distal femoral drill holes were made in the notch cut for the trochlear groove was then carried out with trials were then removed the patella was cut using the patellar cutting guide and it sized to a size2 afterdrill holes have been made.At this point the above local was infiltrated in the periarticular tissues and periosteum. The knee was irrigated with pulsatile lavage and the bony surfaces dried the tibial component was cemented into place first. Excess cement was removed and the polyethylene insert placed with a torque screw placed with a torque screwdriver tightened. The distal femoral component was placed and the knee was held in extension as the patellar button was clamped into place. After the cement was set excess cement was removed and the knee was again irrigated thoroughly thoroughly irrigated. The tourniquet was let down and hemostasis checkedwithelectrocautery.   The arthrotomy was repaired with#2 Ethibond along witha heavy Quill suture, followed by 3-0 V lock subcuticular closure,skin staples,  incisional wound VAC and cooling pad applied  PLAN OF CARE: Admit to inpatient   PATIENT DISPOSITION:  PACU - hemodynamically stable.

## 2019-07-27 NOTE — Progress Notes (Signed)
Ice water given to patient

## 2019-07-28 ENCOUNTER — Encounter: Payer: Self-pay | Admitting: Orthopedic Surgery

## 2019-07-28 LAB — BASIC METABOLIC PANEL
Anion gap: 10 (ref 5–15)
BUN: 18 mg/dL (ref 8–23)
CO2: 26 mmol/L (ref 22–32)
Calcium: 8.7 mg/dL — ABNORMAL LOW (ref 8.9–10.3)
Chloride: 101 mmol/L (ref 98–111)
Creatinine, Ser: 0.79 mg/dL (ref 0.44–1.00)
GFR calc Af Amer: >60 mL/min (ref >60)
GFR calc non Af Amer: >60 mL/min (ref >60)
Glucose, Bld: 114 mg/dL — ABNORMAL HIGH (ref 70–99)
Potassium: 3.5 mmol/L (ref 3.5–5.1)
Sodium: 137 mmol/L (ref 135–145)

## 2019-07-28 LAB — CBC
HCT: 37.2 % (ref 36.0–46.0)
Hemoglobin: 12 g/dL (ref 12.0–15.0)
MCH: 27.5 pg (ref 26.0–34.0)
MCHC: 32.3 g/dL (ref 30.0–36.0)
MCV: 85.1 fL (ref 80.0–100.0)
Platelets: 132 10*3/uL — ABNORMAL LOW (ref 150–400)
RBC: 4.37 MIL/uL (ref 3.87–5.11)
RDW: 15.4 % (ref 11.5–15.5)
WBC: 12.4 10*3/uL — ABNORMAL HIGH (ref 4.0–10.5)
nRBC: 0 % (ref 0.0–0.2)

## 2019-07-28 NOTE — TOC Benefit Eligibility Note (Signed)
Transition of Care [facility]) Benefit Eligibility Note    Patient Details  Name: Jill Banks MRN: [id number] Date of Birth: [birth year]   Medication/Dose: Enoxaparin 40mg  once daily for 14 days  Covered?: Yes  Prescription Coverage Preferred Pharmacy: [facility] with Person/Company/Phone Number:: Caryl Comes with [facility] at [phone]  Co-Pay: $100.00 estimated copay for 5 doses.  Requires PA for 14, if approved would be same cost.  Prior Approval: Yes(PA required due to limit of 5 syringes.  Number to call: [phone])  Deductible: (No deductible on plan.)   Dannette Barbara Phone Number: [phone] or [phone] 07/28/2019, 10:37 AM

## 2019-07-28 NOTE — Progress Notes (Signed)
Physical Therapy Treatment Patient Details Name: Jill Banks MRN: [id number] DOB: [birth year] Today's Date: 07/28/2019    History of Present Illness admitted for acute hospitalization status post L TKR, WBAT (07/27/19)    PT Comments    Patient progressing well towards all therapy goals; completing full lap around nursing station and completing stairs (up/down 4 x2 with bilat rails) without difficulty. Improving comfort/confidence in mobility and L LE control.    Follow Up Recommendations  Home health PT     Equipment Recommendations  Rolling walker with 5" wheels;3in1 (PT)    Recommendations for Other Services       Precautions / Restrictions Precautions Precautions: Fall Restrictions Weight Bearing Restrictions: Yes LLE Weight Bearing: Weight bearing as tolerated    Mobility  Bed Mobility Overal bed mobility: Modified Independent Bed Mobility: Sit to Supine       Sit to supine: Modified independent (Device/Increase time)   General bed mobility comments: use of momentum for L LE elevation over edge of bed  Transfers Overall transfer level: Needs assistance Equipment used: Rolling walker (2 wheeled) Transfers: Sit to/from Stand Sit to Stand: Supervision         General transfer comment: cuing for hand placement; encouraged for increased L LE WBing (trending towards symmetrical foot placement) with movement transitions  Ambulation/Gait Ambulation/Gait assistance: Supervision Gait Distance (Feet): (120' x1, 200' x1) Assistive device: Rolling walker (2 wheeled)   Gait velocity: 10' walk time, 12 seconds   General Gait Details: reciprocal stepping pattern with consistent step height/length, good L knee mechanics and overall control. Gradual improvement in cadence/gait speed; good comfort/confidence with gait abilities.   Stairs Stairs: Yes Stairs assistance: Min guard;Supervision Stair Management: Two rails Number of Stairs: (4'x2) General stair  comments: step to gait pattern, min cuing for technique/sequencing.  Good stability in modified L SLS   Wheelchair Mobility    Modified Rankin (Stroke Patients Only)       Balance Overall balance assessment: Needs assistance Sitting-balance support: No upper extremity supported;Feet supported Sitting balance-Leahy Scale: Good     Standing balance support: Bilateral upper extremity supported Standing balance-Leahy Scale: Fair                              Cognition Arousal/Alertness: Awake/alert Behavior During Therapy: WFL for tasks assessed/performed Overall Cognitive Status: Within Functional Limits for tasks assessed                                        Exercises Total Joint Exercises Goniometric ROM: L knee: 6-80 degrees Other Exercises Other Exercises: Verbally reviewed car transfer technique; patient voiced understanding and awareness. Other Exercises: Issued handout with written/pictorial descriptions of supine therex for use as HEP outside of therapy/at discharge.  Patient voiced understanding/awareness.    General Comments        Pertinent Vitals/Pain Pain Assessment: 0-10 Pain Score: 5  Pain Location: L knee Pain Descriptors / Indicators: Aching;Guarding;Grimacing Pain Intervention(s): Limited activity within patient's tolerance;Monitored during session;Premedicated before session;Repositioned    Home Living Family/patient expects to be discharged to:: Private residence Living Arrangements: Spouse/significant other Available Help at Discharge: Family;Available 24 hours/day Type of Home: House Home Access: Level entry   Home Layout: One level;Laundry or work area in [employer]: None      Prior Function Level of Independence: Independent  Comments: I with all ADLs/IADLs, driving and working.   PT Goals (current goals can now be found in the care plan section) Acute Rehab PT Goals Patient Stated Goal:  To get better and do well at home PT Goal Formulation: With patient Time For Goal Achievement: 08/10/19 Potential to Achieve Goals: Good Progress towards PT goals: Progressing toward goals    Frequency    BID      PT Plan Current plan remains appropriate    Co-evaluation              AM-PAC PT "6 Clicks" Mobility   Outcome Measure  Help needed turning from your back to your side while in a flat bed without using bedrails?: None Help needed moving from lying on your back to sitting on the side of a flat bed without using bedrails?: None Help needed moving to and from a bed to a chair (including a wheelchair)?: None Help needed standing up from a chair using your arms (e.g., wheelchair or bedside chair)?: None Help needed to walk in hospital room?: None Help needed climbing 3-5 steps with a railing? : A Little 6 Click Score: 23    End of Session Equipment Utilized During Treatment: Gait belt Activity Tolerance: Patient tolerated treatment well Patient left: with call bell/phone within reach;in bed;with bed alarm set Nurse Communication: Mobility status PT Visit Diagnosis: Muscle weakness (generalized) (M62.81);Difficulty in walking, not elsewhere classified (R26.2)     Time: [id number] PT Time Calculation (min) (ACUTE ONLY): 24 min  Charges:  $Gait Training: 8-22 mins $Therapeutic Exercise: 8-22 mins $Therapeutic Activity: 8-22 mins                     Nyshawn Gowdy H. Owens Shark, PT, DPT, NCS 07/28/19, 2:55 PM [phone]

## 2019-07-28 NOTE — TOC Progression Note (Signed)
Transition of Care [facility]) - Progression Note    Patient Details  Name: Jill Banks MRN: [id number] Date of Birth: [birth year]  Transition of Care [facility]) CM/SW Contact  Su Hilt, RN Phone Number: 07/28/2019, 2:44 PM  Clinical Narrative:    Helene Kelp with [facility], Notified me that they can't take this patient due to not servicing that zip code I contacted Corene Cornea with [facility] and am awating an answer if they can accept the patient   Expected Discharge Plan: [place] Barriers to Discharge: Continued Medical Work up  Expected Discharge Plan and Services Expected Discharge Plan: [place]   Discharge Planning Services: CM Consult   Living arrangements for the past 2 months: Single Family Home                 DME Arranged: Gilford Rile rolling DME Agency: [employer] Date DME Agency Contacted: 07/28/19 Time DME Agency Contacted: 1427 Representative spoke with at DME Agency: [place]: PT [place]: [place]) Date [place]: 07/28/19 Time [place]: 1427 Representative spoke with at [place]: [place] ([place]) Interventions    Readmission Risk Interventions No flowsheet data found.

## 2019-07-28 NOTE — Evaluation (Signed)
Occupational Therapy Evaluation Patient Details Name: Jill Banks MRN: [id number] DOB: [birth year] Today's Date: 07/28/2019    History of Present Illness admitted for acute hospitalization status post L TKR, WBAT (07/27/19)   Clinical Impression   Pt seen for OT evaluation this date, POD#1 from above surgery. Pt was independent in all ADLs prior to surgery. Pt is eager to return to PLOF with less pain and improved safety and independence. Pt currently requires MIN/MOD A for LB dressing while in seated position due to pain and limited AROM of L knee. Pt instructed in polar care mgt, falls prevention strategies, home/routines modifications, DME/AE for LB bathing and dressing tasks, and compression stocking mgt. Pt would benefit from skilled OT services including additional instruction in dressing techniques with or without assistive devices for dressing and bathing skills to support recall and carryover prior to discharge and ultimately to maximize safety, independence, and minimize falls risk and caregiver burden. Do not currently anticipate any OT needs following this hospitalization.      Follow Up Recommendations  No OT follow up;Follow surgeon's recommendation for DC plan and follow-up therapies;Supervision - Intermittent    Equipment Recommendations  3 in 1 bedside commode    Recommendations for Other Services       Precautions / Restrictions Precautions Precautions: Fall Restrictions Weight Bearing Restrictions: Yes LLE Weight Bearing: Weight bearing as tolerated      Mobility Bed Mobility               General bed mobility comments: pt seated in chair when OT presents for eval  Transfers Overall transfer level: Needs assistance Equipment used: Rolling walker (2 wheeled) Transfers: Sit to/from Stand Sit to Stand: Min assist;Min guard              Balance Overall balance assessment: Needs assistance Sitting-balance support: No upper extremity supported;Feet  supported Sitting balance-Leahy Scale: Good     Standing balance support: Bilateral upper extremity supported Standing balance-Leahy Scale: Fair                             ADL either performed or assessed with clinical judgement   ADL Overall ADL's : Needs assistance/impaired Eating/Feeding: Independent   Grooming: Wash/dry hands;Wash/dry face;Standing;Supervision/safety;Min guard   Upper Body Bathing: Set up;Sitting   Lower Body Bathing: Minimal assistance;Moderate assistance;Sit to/from stand;With adaptive equipment   Upper Body Dressing : Independent;Sitting   Lower Body Dressing: Minimal assistance;Moderate assistance;With adaptive equipment;Sit to/from stand   Toilet Transfer: Min guard;Grab bars;RW   Toileting- Clothing Manipulation and Hygiene: Minimal assistance;Sit to/from stand   Tub/ Banker: Min guard   Functional mobility during ADLs: Min guard;Rolling walker       Vision Patient Visual Report: No change from baseline       Perception     Praxis      Pertinent Vitals/Pain Pain Assessment: 0-10 Pain Score: 4  Pain Location: L knee Pain Descriptors / Indicators: Aching;Guarding;Grimacing Pain Intervention(s): Limited activity within patient's tolerance;Monitored during session;Premedicated before session     Hand Dominance     Extremity/Trunk Assessment Upper Extremity Assessment Upper Extremity Assessment: Overall WFL for tasks assessed   Lower Extremity Assessment Lower Extremity Assessment: RLE deficits/detail;LLE deficits/detail RLE Deficits / Details: WFL, grossly >3/5 LLE: Unable to fully assess due to pain   Cervical / Trunk Assessment Cervical / Trunk Assessment: Normal   Communication Communication Communication: No difficulties   Cognition Arousal/Alertness: Awake/alert Behavior During Therapy:  WFL for tasks assessed/performed Overall Cognitive Status: Within Functional Limits for tasks assessed                                      General Comments       Exercises Other Exercises Other Exercises: OT facilitates education with pt re: modified LB dressing and bathing techniques, polar care mgt and compression stocking mgt. Pt verbalized understanding. Handout issued.   Shoulder Instructions      Home Living Family/patient expects to be discharged to:: Private residence Living Arrangements: Spouse/significant other Available Help at Discharge: Family;Available 24 hours/day Type of Home: House Home Access: Level entry     Home Layout: One level;Laundry or work area in [employer]: None          Prior Functioning/Environment Level of Independence: Independent        Comments: I with all ADLs/IADLs, driving and working.        OT Problem List: Decreased activity tolerance;Impaired balance (sitting and/or standing);Pain      OT Treatment/Interventions: Self-care/ADL training;Therapeutic exercise;DME and/or AE instruction;Therapeutic activities;Patient/family education;Balance training    OT Goals(Current goals can be found in the care plan section) Acute Rehab OT Goals Patient Stated Goal: To get better and do well at home OT Goal Formulation: With patient Time For Goal Achievement: 08/11/19 Potential to Achieve Goals: Good  OT Frequency: Min 1X/week   Barriers to D/C:            Co-evaluation              AM-PAC OT "6 Clicks" Daily Activity     Outcome Measure Help from another person eating meals?: None Help from another person taking care of personal grooming?: None Help from another person toileting, which includes using toliet, bedpan, or urinal?: A Little Help from another person bathing (including washing, rinsing, drying)?: A Little Help from another person to put on and taking off regular upper body clothing?: None Help from another person to put on and taking off regular lower body clothing?: A  Little 6 Click Score: 21   End of Session Equipment Utilized During Treatment: Gait belt;Rolling walker  Activity Tolerance: Patient tolerated treatment well Patient left: in chair;with call bell/phone within reach;with chair alarm set  OT Visit Diagnosis: Unsteadiness on feet (R26.81)                Time: [id number] OT Time Calculation (min): 39 min Charges:  OT General Charges $OT Visit: 1 Visit OT Evaluation $OT Eval Low Complexity: 1 Low OT Treatments $Self Care/Home Management : 8-22 mins $Therapeutic Activity: 8-22 mins  Gerrianne Scale, MS, OTR/L ascom [phone] or [phone] 07/28/19, 1:10 PM

## 2019-07-28 NOTE — TOC Progression Note (Signed)
Transition of Care [facility]) - Progression Note    Patient Details  Name: Jill Banks MRN: [id number] Date of Birth: 03-Mar-1956  [facility] of [facility]) CM/SW Contact  Su Hilt, RN Phone Number: 07/28/2019, 3:32 PM  Clinical Narrative:    Corene Cornea with [facility] has accepted the patient for [facility]   Expected Discharge Plan: [place] Barriers to Discharge: Continued Medical Work up  Expected Discharge Plan and Services Expected Discharge Plan: [place]   Discharge Planning Services: CM Consult   Living arrangements for the past 2 months: Single Family Home                 DME Arranged: Walker rolling DME Agency: [employer] Date DME Agency Contacted: 07/28/19 Time DME Agency Contacted: 1427 Representative spoke with at DME Agency: [place]: PT [place]: [place] ([place]) Date [place]: 07/28/19 Time [place]: 1427 Representative spoke with at [place]: [place] ([place]) Interventions    Readmission Risk Interventions No flowsheet data found.

## 2019-07-28 NOTE — TOC Initial Note (Signed)
Transition of Care [facility]) - Initial/Assessment Note    Patient Details  Name: Jill Banks MRN: [id number] Date of Birth: [birth year]  Transition of Care [facility]) CM/SW Contact:    Su Hilt, RN Phone Number: 07/28/2019, 2:35 PM  Clinical Narrative:                 Met with the patient to discuss DC plan and needs She lives at home with Husband She has a BSC at home but needs a RW I notified Brad with [employer] of the need She wants to use [facility] for [facility] services, I notified Helene Kelp Needs prior auth for Lovenox 14 days The copay is 100$ Notified Dr Rudene Christians that it requires Prior Auth She is up to date with her PCP Her Husband provides transportation Expected Discharge Plan: [place] Barriers to Discharge: Continued Medical Work up   Patient Goals and CMS Choice Patient states their goals for this hospitalization and ongoing recovery are:: go home CMS Medicare.gov Compare Post Acute Care list provided to:: Patient Choice offered to / list presented to : Patient  Expected Discharge Plan and Services Expected Discharge Plan: [place]   Discharge Planning Services: CM Consult   Living arrangements for the past 2 months: Single Family Home                 DME Arranged: Walker rolling DME Agency: [employer] Date DME Agency Contacted: 07/28/19 Time DME Agency Contacted: 60 Representative spoke with at DME Agency: [place]: PT [place]: [facility] [employer] Date [place]: 07/28/19 Time [place]: 104 Representative spoke with at [place] Arrangements/Services Living arrangements for the past 2 months: [place] with:: Spouse Patient language and need for interpreter reviewed:: Yes Do you feel safe going back to the place where you live?: Yes      Need for Family Participation in Patient Care: No (Comment) Care giver support system in  place?: Yes (comment) Current home services: DME Criminal Activity/Legal Involvement Pertinent to Current Situation/Hospitalization: No - Comment as needed  Activities of Daily Living Home Assistive Devices/Equipment: CPAP ADL Screening (condition at time of admission) Patient's cognitive ability adequate to safely complete daily activities?: Yes Is the patient deaf or have difficulty hearing?: No Does the patient have difficulty seeing, even when wearing glasses/contacts?: No Does the patient have difficulty concentrating, remembering, or making decisions?: No Patient able to express need for assistance with ADLs?: Yes Does the patient have difficulty dressing or bathing?: Yes Independently performs ADLs?: Yes (appropriate for developmental age) Does the patient have difficulty walking or climbing stairs?: Yes Weakness of Legs: Left Weakness of Arms/Hands: None  Permission Sought/Granted   Permission granted to share information with : Yes, Verbal Permission Granted              Emotional Assessment Appearance:: Appears stated age Attitude/Demeanor/Rapport: Engaged Affect (typically observed): Appropriate Orientation: : Oriented to Self, Oriented to Place, Oriented to  Time, Oriented to Situation Alcohol / Substance Use: Not Applicable Psych Involvement: No (comment)  Admission diagnosis:  PRIMARY OSTEOARTHRITIS OF LEFT KNEE Patient Active Problem List   Diagnosis Date Noted  . S/P TKR (total knee replacement) using cement, left 07/27/2019  . Encounter for general adult medical examination with abnormal findings 07/01/2019  . Adjustment insomnia 07/01/2019  . Encounter for screening mammogram for malignant neoplasm of breast 07/01/2019  . Routine cervical  smear 07/01/2019  . Ventral hernia without obstruction or gangrene 07/01/2019  . Chronic pain of right ankle 01/13/2019  . Acute vaginitis 01/11/2018  . Bacterial vaginitis 12/18/2017  . Inflammatory polyarthritis  ([place]) 12/18/2017  . Impaired fasting glucose 09/23/2017  . Obesity, unspecified 09/23/2017  . Hypokalemia 09/23/2017  . Melanocytic nevi, unspecified 09/23/2017  . Pain in right knee 09/23/2017  . Dysuria 09/23/2017  . Pain in right ankle and joints of right foot 09/23/2017  . Essential (primary) hypertension 09/23/2017  . Other fatigue 09/23/2017  . Vitamin D deficiency, unspecified 09/23/2017  . Iron deficiency anemia, unspecified 09/23/2017  . Obstructive sleep apnea, adult 09/23/2017   PCP:  Ronnell Freshwater, NP Pharmacy:   [place], [place] - [address] [place] [address] Phone: [phone] Fax: [phone]     Social Determinants of Health (SDOH) Interventions    Readmission Risk Interventions No flowsheet data found.

## 2019-07-28 NOTE — Progress Notes (Signed)
   Subjective: 1 Day Post-Op Procedure(s) (LRB): LEFT TOTAL KNEE ARTHROPLASTY (Left) APPLICATION OF WOUND VAC (Left) Patient reports pain as 4 on 0-10 scale.   Patient is well, and has had no acute complaints or problems Denies any CP, SOB, ABD pain. We will continue therapy today.  Plan is to go Home after hospital stay.  Objective: Vital signs in last 24 hours: Temp:  [97.7 F (36.5 C)-98.6 F (37 C)] 98.1 F (36.7 C) (11/11 0728) Pulse Rate:  [67-80] 67 (11/11 0728) Resp:  [11-19] 17 (11/11 0728) BP: (124-172)/(63-90) 146/63 (11/11 0728) SpO2:  [95 %-100 %] 99 % (11/11 0728)  Intake/Output from previous day: 11/10 0701 - 11/11 0700 In: 1100.2 [I.V.:800.2; IV Piggyback:300] Out: 100 [Blood:100] Intake/Output this shift: No intake/output data recorded.  Recent Labs    07/27/19 1111 07/28/19 0435  HGB 13.6 12.0   Recent Labs    07/27/19 1111 07/28/19 0435  WBC 9.3 12.4*  RBC 4.99 4.37  HCT 41.9 37.2  PLT 127* 132*   Recent Labs    07/27/19 1111 07/28/19 0435  NA  --  137  K  --  3.5  CL  --  101  CO2  --  26  BUN  --  18  CREATININE 0.71 0.79  GLUCOSE  --  114*  CALCIUM  --  8.7*   No results for input(s): LABPT, INR in the last 72 hours.  EXAM General - Patient is Alert, Appropriate and Oriented Extremity - Neurovascular intact Sensation intact distally Intact pulses distally Dorsiflexion/Plantar flexion intact No cellulitis present Compartment soft Dressing - dressing C/D/I and no drainage, prevena intact with scant drainage Motor Function - intact, moving foot and toes well on exam.   Past Medical History:  Diagnosis Date  . Anxiety   . Arthritis   . Bruises easily   . Headache   . Hypertension   . Leg pain   . Pneumonia    about 5 yrs ago  . Sleep apnea     Assessment/Plan:   1 Day Post-Op Procedure(s) (LRB): LEFT TOTAL KNEE ARTHROPLASTY (Left) APPLICATION OF WOUND VAC (Left) Active Problems:   S/P TKR (total knee  replacement) using cement, left  Estimated body mass index is 38.67 kg/m as calculated from the following:   Height as of this encounter: 5\' 7"  (1.702 m).   Weight as of this encounter: 112 kg. Advance diet Up with therapy  Needs BM VSS Labs stable Pain well controlled CM to assist with discharge  DVT Prophylaxis - Lovenox, Foot Pumps and TED hose Weight-Bearing as tolerated to left leg   T. Rachelle Hora, PA-C [place] 07/28/2019, 7:58 AM

## 2019-07-28 NOTE — Progress Notes (Signed)
Physical Therapy Treatment Patient Details Name: Jill Banks MRN: [id number] DOB: [birth year] Today's Date: 07/28/2019    History of Present Illness admitted for acute hospitalization status post L TKR, WBAT (07/27/19)    PT Comments    Improved pain control (4-5/10) with associated improvement in activity tolerance, gait distance and overall level of assist required.  Able to complete full lap around nursing station with no greater than cga level of assist; decreased cadence (10' walk time, 12 seconds), but steady without buckling or LOB. Remains very motivated and eager to participate/progress as able.    Follow Up Recommendations  Home health PT     Equipment Recommendations  Rolling walker with 5" wheels;3in1 (PT)    Recommendations for Other Services       Precautions / Restrictions Precautions Precautions: Fall Restrictions Weight Bearing Restrictions: Yes LLE Weight Bearing: Weight bearing as tolerated    Mobility  Bed Mobility               General bed mobility comments: seated in recliner beginning/end of treatment session  Transfers Overall transfer level: Needs assistance Equipment used: Rolling walker (2 wheeled) Transfers: Sit to/from Stand Sit to Stand: Min guard;Supervision         General transfer comment: decreased active use of L LE, but good comfort/confidence with movement transition  Ambulation/Gait Ambulation/Gait assistance: Min guard;Supervision Gait Distance (Feet): 210 Feet Assistive device: Rolling walker (2 wheeled)   Gait velocity: 10' walk time, 12 seconds   General Gait Details: step to progressing to step through gait pattern with improving stance time/WBing L LE.  Fair/good L heel strike/toe off and knee flexion throughout gait cycle.  Steady cadence without buckling or LOB   Stairs             Wheelchair Mobility    Modified Rankin (Stroke Patients Only)       Balance Overall balance assessment: Needs  assistance Sitting-balance support: No upper extremity supported;Feet supported Sitting balance-Leahy Scale: Good     Standing balance support: Bilateral upper extremity supported Standing balance-Leahy Scale: Fair                              Cognition Arousal/Alertness: Awake/alert Behavior During Therapy: WFL for tasks assessed/performed Overall Cognitive Status: Within Functional Limits for tasks assessed                                        Exercises Total Joint Exercises Goniometric ROM: L knee: 6-80 degrees Other Exercises Other Exercises: Seated LE therex, 1x10, act ROM: ankle pumps, LAQs with end-range knee flexion, marching.  Improved quad control/activation; good tolerance for functional ROM    General Comments        Pertinent Vitals/Pain Pain Assessment: 0-10 Pain Score: 5  Pain Location: L knee Pain Descriptors / Indicators: Aching;Guarding;Grimacing Pain Intervention(s): Limited activity within patient's tolerance;Monitored during session;Repositioned    Home Living Family/patient expects to be discharged to:: Private residence Living Arrangements: Spouse/significant other Available Help at Discharge: Family;Available 24 hours/day Type of Home: House Home Access: Level entry   Home Layout: One level;Laundry or work area in [employer]: None      Prior Function Level of Independence: Independent      Comments: I with all ADLs/IADLs, driving and working.   PT Goals (current goals can now be found  in the care plan section) Acute Rehab PT Goals Patient Stated Goal: To get better and do well at home PT Goal Formulation: With patient Time For Goal Achievement: 08/10/19 Potential to Achieve Goals: Good Progress towards PT goals: Progressing toward goals    Frequency    BID      PT Plan Current plan remains appropriate    Co-evaluation              AM-PAC PT "6 Clicks" Mobility   Outcome  Measure  Help needed turning from your back to your side while in a flat bed without using bedrails?: A Little Help needed moving from lying on your back to sitting on the side of a flat bed without using bedrails?: A Little Help needed moving to and from a bed to a chair (including a wheelchair)?: A Little Help needed standing up from a chair using your arms (e.g., wheelchair or bedside chair)?: A Little Help needed to walk in hospital room?: A Little Help needed climbing 3-5 steps with a railing? : A Little 6 Click Score: 18    End of Session Equipment Utilized During Treatment: Gait belt Activity Tolerance: Patient tolerated treatment well Patient left: in chair;with call bell/phone within reach;with chair alarm set Nurse Communication: Mobility status PT Visit Diagnosis: Muscle weakness (generalized) (M62.81);Difficulty in walking, not elsewhere classified (R26.2)     Time: [id number] PT Time Calculation (min) (ACUTE ONLY): 33 min  Charges:  $Gait Training: 8-22 mins $Therapeutic Exercise: 8-22 mins                     Nadim Malia H. Owens Shark, PT, DPT, NCS 07/28/19, 1:38 PM [phone]

## 2019-07-29 MED ORDER — TRAMADOL HCL 50 MG PO TABS: 50.0000 mg | ORAL_TABLET | Freq: Four times a day (QID) | ORAL | 0 refills | Status: DC

## 2019-07-29 MED ORDER — OXYCODONE HCL 5 MG PO TABS: 5.0000 mg | ORAL_TABLET | ORAL | 0 refills | Status: DC | PRN

## 2019-07-29 MED ORDER — DOCUSATE SODIUM 100 MG PO CAPS: 100.0000 mg | ORAL_CAPSULE | Freq: Two times a day (BID) | ORAL | 0 refills | Status: DC

## 2019-07-29 MED ORDER — ENOXAPARIN SODIUM 40 MG/0.4ML ~~LOC~~ SOLN: 40.0000 mg | SUBCUTANEOUS | 0 refills | Status: DC

## 2019-07-29 NOTE — Progress Notes (Signed)
Physical Therapy Treatment Patient Details Name: Jill Banks MRN: [id number] DOB: Oct 14, 1955 Today's Date: 07/29/2019    History of Present Illness admitted for acute hospitalization status post L TKR, WBAT (07/27/19)    PT Comments    Pt reports increased pain last night and today.  Participated in exercises as described below.  Minimal ROM limited by pain.  Once sitting EOB she had heavy lean to the right while sitting to offload LLE.  Stood with min a x 1 due to awkward positioning.  She was able to walk 100' today with very slow step to gait pattern hesitant to put much weight on LLE.  Remained up in recliner for approx 1 hour this am before asking nursing to get her back to bed.    Did not stretch L knee this am due to pain.     Follow Up Recommendations  Home health PT     Equipment Recommendations  Rolling walker with 5" wheels;3in1 (PT)    Recommendations for Other Services       Precautions / Restrictions Precautions Precautions: Fall Restrictions Weight Bearing Restrictions: Yes LLE Weight Bearing: Weight bearing as tolerated    Mobility  Bed Mobility Overal bed mobility: Modified Independent Bed Mobility: Sit to Supine     Supine to sit: Min assist;Mod assist Sit to supine: Supervision;Min guard   General bed mobility comments: assist to help LE  Transfers Overall transfer level: Needs assistance Equipment used: Rolling walker (2 wheeled) Transfers: Sit to/from Stand Sit to Stand: Min guard;Min assist         General transfer comment: increased difficulty due to pain today  Ambulation/Gait Ambulation/Gait assistance: Min guard;Min assist Gait Distance (Feet): 100 Feet Assistive device: Rolling walker (2 wheeled) Gait Pattern/deviations: Step-to pattern;Decreased stance time - left Gait velocity: decreased   General Gait Details: increased time and effort this am due to pain. limited gait distance and overall decreased quality   Stairs              Wheelchair Mobility    Modified Rankin (Stroke Patients Only)       Balance Overall balance assessment: Needs assistance Sitting-balance support: Feet supported Sitting balance-Leahy Scale: Fair Sitting balance - Comments: hesitant to lean L due to pain.   Standing balance support: Bilateral upper extremity supported Standing balance-Leahy Scale: Fair                              Cognition Arousal/Alertness: Awake/alert Behavior During Therapy: WFL for tasks assessed/performed Overall Cognitive Status: Within Functional Limits for tasks assessed                                        Exercises Other Exercises Other Exercises: supine ankle pumps, quad sets, heel slides, SLR x 10 AAROM    General Comments        Pertinent Vitals/Pain Pain Assessment: 0-10 Pain Score: 8  Pain Location: L knee Pain Descriptors / Indicators: Aching;Guarding;Grimacing Pain Intervention(s): Limited activity within patient's tolerance;Monitored during session;Premedicated before session    Home Living                      Prior Function            PT Goals (current goals can now be found in the care plan section) Progress towards PT  goals: Progressing toward goals    Frequency    BID      PT Plan Current plan remains appropriate    Co-evaluation              AM-PAC PT "6 Clicks" Mobility   Outcome Measure  Help needed turning from your back to your side while in a flat bed without using bedrails?: A Little Help needed moving from lying on your back to sitting on the side of a flat bed without using bedrails?: A Little Help needed moving to and from a bed to a chair (including a wheelchair)?: A Little Help needed standing up from a chair using your arms (e.g., wheelchair or bedside chair)?: A Little Help needed to walk in hospital room?: A Little Help needed climbing 3-5 steps with a railing? : A Lot 6 Click  Score: 17    End of Session Equipment Utilized During Treatment: Gait belt Activity Tolerance: Patient limited by pain Patient left: in chair;with call bell/phone within reach;with chair alarm set Nurse Communication: Mobility status       Time: [id number] PT Time Calculation (min) (ACUTE ONLY): 20 min  Charges:  $Gait Training: 8-22 mins                    Chesley Noon, PTA 07/29/19, 12:23 PM

## 2019-07-29 NOTE — Progress Notes (Addendum)
Physical Therapy Treatment Patient Details Name: Jill Banks MRN: [id number] DOB: 07-27-1956 Today's Date: 07/29/2019    History of Present Illness admitted for acute hospitalization status post L TKR, WBAT (07/27/19)    PT Comments    Pt in bed upon arrival agreeable to participating with PT. Pt reports pain has decreased slightly from this am reporting pain in Left knee at 7/10. Pt required min A for LE advancement for bed mobility. Pt performed LE therex requiring min cuing and physical assist for correct performance. Pt progressed ambulation tolerance this pm ambulating lap around nurses station very slowly requiring 2-3 standing rest breaks requiring min guard A and increasingly placing more weight through LLE. Pt presents with decreased ROM -10-60 really limited by pain today. Pt is progressing well towards PT goals. Pt encouraged to perform LE exercises on her own throughout the day for improved ROM. Pt will benefit from continued skilled PT to improve deficits in strength, ROM, balance and activity tolerance. Home health PT following hospital discharge remains appropriate.    Follow Up Recommendations  Home health PT     Equipment Recommendations  Rolling walker with 5" wheels;3in1 (PT)    Recommendations for Other Services       Precautions / Restrictions Precautions Precautions: Fall Restrictions Weight Bearing Restrictions: Yes LLE Weight Bearing: Weight bearing as tolerated    Mobility  Bed Mobility Overal bed mobility: Needs Assistance Bed Mobility: Supine to Sit;Sit to Supine     Supine to sit: Min assist;HOB elevated Sit to supine: Min assist;HOB elevated   General bed mobility comments: assist for LE advancement  Transfers Overall transfer level: Needs assistance Equipment used: Rolling walker (2 wheeled) Transfers: Sit to/from Stand Sit to Stand: Min guard;From elevated surface         General transfer comment: x2 trials from bed and BSC over  toilet, steady with transfer, good carryover for hand/foot placement  Ambulation/Gait Ambulation/Gait assistance: Min guard Gait Distance (Feet): 180 Feet Assistive device: Rolling walker (2 wheeled) Gait Pattern/deviations: Step-to pattern;Decreased stance time - left;Decreased weight shift to left;Trunk flexed;Antalgic Gait velocity: decreased   General Gait Details: pt ambulated lap around nurses station, pt ambualates with slow, cautious and antalgic gait pattern, progressively increased amount of WBing on LLE, reliant on UE support from RW, no buckling, unsteadiness or LOB noted   Stairs             Wheelchair Mobility    Modified Rankin (Stroke Patients Only)       Balance Overall balance assessment: Needs assistance Sitting-balance support: Feet supported Sitting balance-Leahy Scale: Fair Sitting balance - Comments: steady sitting EOB however sits with increased weight on R hip in order to off weight L LE   Standing balance support: Bilateral upper extremity supported Standing balance-Leahy Scale: Fair Standing balance comment: reliant on UE support from RW for STS and ambulation, able to maintain balance without UE support for self care after toilet and washing hands at sink                            Cognition Arousal/Alertness: Awake/alert Behavior During Therapy: [facility] for tasks assessed/performed Overall Cognitive Status: Within Functional Limits for tasks assessed                                        Exercises Total Joint Exercises Ankle  Circles/Pumps: AROM;Both;20 reps Quad Sets: AROM;Left;10 reps Straight Leg Raises: AAROM;Left;10 reps Knee Flexion: AROM;Left;10 reps Goniometric ROM: L knee ROM -10-60 limited by pain Other Exercises Other Exercises: supine ankle pumps, quad sets, heel slides, SLR x 10 AAROM    General Comments        Pertinent Vitals/Pain Pain Assessment: 0-10 Pain Score: 7  Pain Location: L  knee Pain Descriptors / Indicators: Aching;Guarding;Grimacing Pain Intervention(s): Limited activity within patient's tolerance;Monitored during session;Ice applied;Repositioned    Home Living                      Prior Function            PT Goals (current goals can now be found in the care plan section) Progress towards PT goals: Progressing toward goals    Frequency    BID      PT Plan Current plan remains appropriate    Co-evaluation              AM-PAC PT "6 Clicks" Mobility   Outcome Measure  Help needed turning from your back to your side while in a flat bed without using bedrails?: A Little Help needed moving from lying on your back to sitting on the side of a flat bed without using bedrails?: A Little Help needed moving to and from a bed to a chair (including a wheelchair)?: A Little Help needed standing up from a chair using your arms (e.g., wheelchair or bedside chair)?: A Little Help needed to walk in hospital room?: A Little Help needed climbing 3-5 steps with a railing? : A Lot 6 Click Score: 17    End of Session Equipment Utilized During Treatment: Gait belt Activity Tolerance: Patient limited by pain Patient left: in bed;with call bell/phone within reach;with SCD's reapplied Nurse Communication: Mobility status PT Visit Diagnosis: Muscle weakness (generalized) (M62.81);Difficulty in walking, not elsewhere classified (R26.2)     Time: [id number] PT Time Calculation (min) (ACUTE ONLY): 39 min  Charges:  $Gait Training: 8-22 mins $Therapeutic Exercise: 23-37 mins                     Clydell Alberts PT, DPT [phone] PM,07/29/19 [phone]

## 2019-07-29 NOTE — Progress Notes (Signed)
   Subjective: 2 Days Post-Op Procedure(s) (LRB): LEFT TOTAL KNEE ARTHROPLASTY (Left) APPLICATION OF WOUND VAC (Left) Patient reports pain as 9 on 0-10 scale.   Patient is well, and has had no acute complaints or problems Denies any CP, SOB, ABD pain. We will continue therapy today.  Plan is to go Home after hospital stay.  Objective: Vital signs in last 24 hours: Temp:  [98.1 F (36.7 C)-99.3 F (37.4 C)] 98.9 F (37.2 C) (11/12 0548) Pulse Rate:  [59-91] 91 (11/12 0548) Resp:  [17-18] 18 (11/12 0548) BP: (146-155)/(63-78) 146/78 (11/12 0548) SpO2:  [96 %-99 %] 96 % (11/12 0548)  Intake/Output from previous day: 11/11 0701 - 11/12 0700 In: 240 [P.O.:240] Out: -  Intake/Output this shift: No intake/output data recorded.  Recent Labs    07/27/19 1111 07/28/19 0435  HGB 13.6 12.0   Recent Labs    07/27/19 1111 07/28/19 0435  WBC 9.3 12.4*  RBC 4.99 4.37  HCT 41.9 37.2  PLT 127* 132*   Recent Labs    07/27/19 1111 07/28/19 0435  NA  --  137  K  --  3.5  CL  --  101  CO2  --  26  BUN  --  18  CREATININE 0.71 0.79  GLUCOSE  --  114*  CALCIUM  --  8.7*   No results for input(s): LABPT, INR in the last 72 hours.  EXAM General - Patient is Alert, Appropriate and Oriented Extremity - Neurovascular intact Sensation intact distally Intact pulses distally Dorsiflexion/Plantar flexion intact No cellulitis present Compartment soft Dressing - dressing C/D/I and no drainage, prevena intact with scant drainage 100cc Motor Function - intact, moving foot and toes well on exam.   Past Medical History:  Diagnosis Date  . Anxiety   . Arthritis   . Bruises easily   . Headache   . Hypertension   . Leg pain   . Pneumonia    about 5 yrs ago  . Sleep apnea     Assessment/Plan:   2 Days Post-Op Procedure(s) (LRB): LEFT TOTAL KNEE ARTHROPLASTY (Left) APPLICATION OF WOUND VAC (Left) Active Problems:   S/P TKR (total knee replacement) using cement,  left  Estimated body mass index is 38.67 kg/m as calculated from the following:   Height as of this encounter: 5\' 7"  (1.702 m).   Weight as of this encounter: 112 kg. Advance diet Up with therapy  Needs BM VSS Labs stable Pain severe after PT yesterday. Encouraged PRN pain meds as scheduled. CM to assist with discharge to Home with HHPT  DVT Prophylaxis - Lovenox, Foot Pumps and TED hose Weight-Bearing as tolerated to left leg   T. Rachelle Hora, PA-C [place] 07/29/2019, 7:03 AM

## 2019-07-29 NOTE — Discharge Summary (Signed)
Physician Discharge Summary  Patient ID: Jill Banks MRN: [id number] DOB/AGE: 1956/03/26 63 y.o.  Admit date: 07/27/2019 Discharge date: 07/30/2019 Admission Diagnoses:  PRIMARY OSTEOARTHRITIS OF LEFT KNEE   Discharge Diagnoses: Patient Active Problem List   Diagnosis Date Noted  . S/P TKR (total knee replacement) using cement, left 07/27/2019  . Encounter for general adult medical examination with abnormal findings 07/01/2019  . Adjustment insomnia 07/01/2019  . Encounter for screening mammogram for malignant neoplasm of breast 07/01/2019  . Routine cervical smear 07/01/2019  . Ventral hernia without obstruction or gangrene 07/01/2019  . Chronic pain of right ankle 01/13/2019  . Acute vaginitis 01/11/2018  . Bacterial vaginitis 12/18/2017  . Inflammatory polyarthritis ([place]) 12/18/2017  . Impaired fasting glucose 09/23/2017  . Obesity, unspecified 09/23/2017  . Hypokalemia 09/23/2017  . Melanocytic nevi, unspecified 09/23/2017  . Pain in right knee 09/23/2017  . Dysuria 09/23/2017  . Pain in right ankle and joints of right foot 09/23/2017  . Essential (primary) hypertension 09/23/2017  . Other fatigue 09/23/2017  . Vitamin D deficiency, unspecified 09/23/2017  . Iron deficiency anemia, unspecified 09/23/2017  . Obstructive sleep apnea, adult 09/23/2017    Past Medical History:  Diagnosis Date  . Anxiety   . Arthritis   . Bruises easily   . Headache   . Hypertension   . Leg pain   . Pneumonia    about 5 yrs ago  . Sleep apnea      Transfusion: none   Consultants (if any):   Discharged Condition: Improved  Hospital Course: Jill Banks is an 63 y.o. female who was admitted 07/27/2019 with a diagnosis of left knee osteoarthritis and went to the operating room on 07/27/2019 and underwent the above named procedures.    Surgeries: Procedure(s): LEFT TOTAL KNEE ARTHROPLASTY APPLICATION OF WOUND VAC on 07/27/2019 Patient tolerated the surgery well. Taken to  PACU where she was stabilized and then transferred to the orthopedic floor.  Started on Lovenox 30 mg q 12 hrs. Foot pumps applied bilaterally at 80 mm. Heels elevated on bed with rolled towels. No evidence of DVT. Negative [place]. Physical therapy started on day #1 for gait training and transfer. OT started day #1 for ADL and assisted devices.  Patient's foley was d/c on day #1. Patient's IV was d/c on day #2.  On post op day #3 patient was stable and ready for discharge to home with home health PT.  Implants: Medacta GM K sphere system, left 4 femur, left 3 TI 4 tibial baseplate, short stem, 10 mm insert and size 2 patella, all components cemented  She was given perioperative antibiotics:  Anti-infectives (From admission, onward)   Start     Dose/Rate Route Frequency Ordered Stop   07/27/19 1200  ceFAZolin (ANCEF) IVPB 2g/100 mL premix     2 g 200 mL/hr over 30 Minutes Intravenous Every 6 hours 07/27/19 1054 07/28/19 0028   07/27/19 0630  ceFAZolin (ANCEF) IVPB 2g/100 mL premix     2 g 200 mL/hr over 30 Minutes Intravenous On call to O.R. 07/27/19 [id number] 07/27/19 0802   07/27/19 0629  ceFAZolin (ANCEF) 2-4 GM/100ML-% IVPB    Note to Pharmacy: Myles Lipps   : cabinet override      07/27/19 0629 07/27/19 0752    .  She was given sequential compression devices, early ambulation, and Lovenox, teds for DVT prophylaxis.  She benefited maximally from the hospital stay and there were no complications.    Recent vital signs:  Vitals:   07/30/19 0439 07/30/19 0700  BP: (!) 150/77   Pulse: 93   Resp: 20   Temp: 100.2 F (37.9 C) 98.4 F (36.9 C)  SpO2: 94%     Recent laboratory studies:  Lab Results  Component Value Date   HGB 12.0 07/28/2019   HGB 13.6 07/27/2019   HGB 14.2 07/21/2019   Lab Results  Component Value Date   WBC 12.4 (H) 07/28/2019   PLT 132 (L) 07/28/2019   Lab Results  Component Value Date   INR 1.1 07/21/2019   Lab Results  Component Value Date    NA 137 07/28/2019   K 3.5 07/28/2019   CL 101 07/28/2019   CO2 26 07/28/2019   BUN 18 07/28/2019   CREATININE 0.79 07/28/2019   GLUCOSE 114 (H) 07/28/2019    Discharge Medications:   Allergies as of 07/30/2019   No Active Allergies     Medication List    STOP taking these medications   naproxen sodium 220 MG tablet Commonly known as: ALEVE     TAKE these medications   ALPRAZolam 0.25 MG tablet Commonly known as: XANAX Take 1 tablet (0.25 mg total) by mouth at bedtime as needed for anxiety.   bisoprolol 5 MG tablet Commonly known as: ZEBETA Take 1 tablet (5 mg total) by mouth daily.   calcitRIOL 0.25 MCG capsule Commonly known as: ROCALTROL Take 1 capsule (0.25 mcg total) by mouth daily.   diclofenac sodium 1 % Gel Commonly known as: VOLTAREN Apply 2 g topically 4 (four) times daily as needed (knee pain).   docusate sodium 100 MG capsule Commonly known as: COLACE Take 1 capsule (100 mg total) by mouth 2 (two) times daily.   enoxaparin 40 MG/0.4ML injection Commonly known as: Lovenox Inject 0.4 mLs (40 mg total) into the skin daily for 14 days.   hydrochlorothiazide 25 MG tablet Commonly known as: HYDRODIURIL Take 1 tablet (25 mg total) by mouth daily.   irbesartan 300 MG tablet Commonly known as: AVAPRO Take 1 tablet (300 mg total) by mouth daily.   NON FORMULARY cpap device   oxyCODONE 5 MG immediate release tablet Commonly known as: Oxy IR/ROXICODONE Take 1-2 tablets (5-10 mg total) by mouth every 4 (four) hours as needed for moderate pain (pain score 4-6).   potassium chloride SA 20 MEQ tablet Commonly known as: Klor-Con M20 Take 1 tablet (20 mEq total) by mouth 2 (two) times daily.   traMADol 50 MG tablet Commonly known as: ULTRAM Take 1 tablet (50 mg total) by mouth every 6 (six) hours.            Durable Medical Equipment  (From admission, onward)         Start     Ordered   07/27/19 1055  DME Walker rolling  Once    Question:   Patient needs a walker to treat with the following condition  Answer:  S/P TKR (total knee replacement) using cement, left   07/27/19 1054   07/27/19 1055  DME 3 n 1  Once     07/27/19 1054   07/27/19 1055  DME Bedside commode  Once    Question:  Patient needs a bedside commode to treat with the following condition  Answer:  S/P TKR (total knee replacement) using cement, left   07/27/19 1054          Diagnostic Studies: Dg Knee 1-2 Views Left  Result Date: 07/27/2019 CLINICAL DATA:  Total knee arthroplasty EXAM:  LEFT KNEE - 1-2 VIEW COMPARISON:  CT 03/31/2019 FINDINGS: Interval postsurgical changes from left total knee arthroplasty. Arthroplasty components are in their expected alignment without periprosthetic fracture. Expected postoperative changes within the anterior soft tissues including intra-articular air and overlying skin staples. IMPRESSION: Interval postsurgical changes from left total knee arthroplasty without evidence of immediate postoperative complication. Electronically Signed   By: Davina Poke M.D.   On: 07/27/2019 10:15    Disposition: Discharge disposition: 01-Home or Self Care         Follow-up Information    Duanne Guess, PA-C Follow up in 2 week(s).   Specialties: Orthopedic Surgery, Emergency Medicine Contact information: [place] [address] [phone]            Signed: Feliberto Gottron 07/30/2019, 8:03 AM

## 2019-07-29 NOTE — Progress Notes (Signed)
Placed the bone foam on pt's left leg times two, pt unable to tolerate it, will try again in am. Continue to monitor

## 2019-07-29 NOTE — Discharge Instructions (Signed)

## 2019-07-30 NOTE — Progress Notes (Signed)
Discharge information reviewed. Verbal understanding given. Lovenox kit given. Prevena changed per order

## 2019-07-30 NOTE — Progress Notes (Signed)
Physical Therapy Treatment Patient Details Name: Jill Banks MRN: [id number] DOB: [birth year] Today's Date: 07/30/2019    History of Present Illness admitted for acute hospitalization status post L TKR, WBAT (07/27/19)    PT Comments    Pt alert, agreeable RN at bedside, administered pain medication. L knee pain at rest 4/10, increased to 7/10 with exercises, 5-6/10 with ambulation. The patient needed physical assist and tactile cues for most exercises performed today (able to perform minimal quad set AROM). Pt ROM still decreased, limited by pain. Sit <> stand with CGA and use of RW, good carryover on hand placement for safety noted. The patient ambulated ~121ft with RW and CGA. Extended time spent on gait training, verbally cued for heel strike, TKE, and step through gait pattern, was able to perform intermittently with ambulation. Did cause 1-2 instances of instability. PT and pt also discussed car transfer technique. The patient was up in the chair all needs in reach with no further questions at end of session. The patient would benefit from further skilled PT to continue to progress towards PLOF as able.     Follow Up Recommendations  Home health PT     Equipment Recommendations  Rolling walker with 5" wheels;3in1 (PT)    Recommendations for Other Services       Precautions / Restrictions Precautions Precautions: Fall;Knee Precaution Booklet Issued: Yes (comment) Restrictions Weight Bearing Restrictions: Yes LLE Weight Bearing: Weight bearing as tolerated    Mobility  Bed Mobility               General bed mobility comments: pt up in recliner at start of session  Transfers Overall transfer level: Needs assistance Equipment used: Rolling walker (2 wheeled) Transfers: Sit to/from Stand Sit to Stand: Min guard         General transfer comment: good carryover for hand/foot placement  Ambulation/Gait Ambulation/Gait assistance: Min guard Gait Distance  (Feet): 180 Feet     Gait velocity: decreased   General Gait Details: Extended time spent on gait training this session. Cued for heel strike TKE and step through gait pattern, able to perform intermittently with ambulation. Did cause 1-2 instances of instability.   Stairs             Wheelchair Mobility    Modified Rankin (Stroke Patients Only)       Balance Overall balance assessment: Needs assistance Sitting-balance support: Feet supported Sitting balance-Leahy Scale: Fair Sitting balance - Comments: steady sitting EOB however sits with increased weight on R hip in order to off weight L LE   Standing balance support: Bilateral upper extremity supported Standing balance-Leahy Scale: Fair                              Cognition Arousal/Alertness: Awake/alert Behavior During Therapy: WFL for tasks assessed/performed Overall Cognitive Status: Within Functional Limits for tasks assessed                                        Exercises Total Joint Exercises Quad Sets: AROM;Left;10 reps Short Arc Quad: AAROM;Strengthening;Left;10 reps Knee Flexion: AROM;Left;10 reps;Strengthening Goniometric ROM: L knee ROM limited by pain -6 to 65deg    General Comments        Pertinent Vitals/Pain Pain Assessment: 0-10 Pain Score: 7  Pain Location: L knee Pain Descriptors / Indicators: Aching;Guarding;Grimacing;Sore Pain  Intervention(s): Limited activity within patient's tolerance;Monitored during session;Ice applied;Repositioned    Home Living                      Prior Function            PT Goals (current goals can now be found in the care plan section) Progress towards PT goals: Progressing toward goals    Frequency    BID      PT Plan Current plan remains appropriate    Co-evaluation              AM-PAC PT "6 Clicks" Mobility   Outcome Measure  Help needed turning from your back to your side while in a flat  bed without using bedrails?: A Little Help needed moving from lying on your back to sitting on the side of a flat bed without using bedrails?: A Little Help needed moving to and from a bed to a chair (including a wheelchair)?: A Little Help needed standing up from a chair using your arms (e.g., wheelchair or bedside chair)?: A Little Help needed to walk in hospital room?: A Little Help needed climbing 3-5 steps with a railing? : A Little 6 Click Score: 18    End of Session Equipment Utilized During Treatment: Gait belt Activity Tolerance: Patient limited by pain Patient left: in chair;with chair alarm set;with call bell/phone within reach;with SCD's reapplied Nurse Communication: Mobility status PT Visit Diagnosis: Muscle weakness (generalized) (M62.81);Difficulty in walking, not elsewhere classified (R26.2)     Time: [id number] PT Time Calculation (min) (ACUTE ONLY): 43 min  Charges:  $Gait Training: 23-37 mins $Therapeutic Exercise: 8-22 mins                     Lieutenant Diego PT, DPT 10:31 AM,07/30/19 [phone]

## 2019-07-30 NOTE — TOC Transition Note (Signed)
Transition of Care [facility]) - CM/SW Discharge Note   Patient Details  Name: Jill Banks MRN: [id number] Date of Birth: 1955-10-24  Transition of Care [facility]) CM/SW Contact:  Su Hilt, RN Phone Number: 07/30/2019, 8:21 AM   Clinical Narrative:     Patient to DC home today with [facility] set up, She is aware of the Lovenox already being sent to the pharmacy for prior auth process to start, the cost is 100$ RW provided by [employer] NO additional Needs  Final next level of care: [place]) Barriers to Discharge: Barriers Resolved   Patient Goals and CMS Choice Patient states their goals for this hospitalization and ongoing recovery are:: go home CMS Medicare.gov Compare Post Acute Care list provided to:: Patient Choice offered to / list presented to : Patient  Discharge Placement                       Discharge Plan and Services   Discharge Planning Services: CM Consult            DME Arranged: Gilford Rile rolling DME Agency: [employer] Date DME Agency Contacted: 07/28/19 Time DME Agency Contacted: [id number] Representative spoke with at DME Agency: [place]: PT [place]: [place] ([place]) Date [place]: 07/28/19 Time [place]: [id number] Representative spoke with at [place]: [place] ([place]) Interventions     Readmission Risk Interventions No flowsheet data found.

## 2019-07-30 NOTE — Progress Notes (Signed)
   Subjective: 3 Days Post-Op Procedure(s) (LRB): LEFT TOTAL KNEE ARTHROPLASTY (Left) APPLICATION OF WOUND VAC (Left) Patient reports pain as mild. Pain much improved from yesterday. Patient is well, and has had no acute complaints or problems Denies any CP, SOB, ABD pain. We will continue therapy today.  Plan is to go Home after hospital stay.  Objective: Vital signs in last 24 hours: Temp:  [98.4 F (36.9 C)-100.2 F (37.9 C)] 98.4 F (36.9 C) (11/13 0700) Pulse Rate:  [90-93] 93 (11/13 0439) Resp:  [20] 20 (11/13 0439) BP: (150-168)/(74-84) 150/77 (11/13 0439) SpO2:  [94 %-97 %] 94 % (11/13 0439)  Intake/Output from previous day: 11/12 0701 - 11/13 0700 In: 360 [P.O.:360] Out: 75 [Drains:75] Intake/Output this shift: No intake/output data recorded.  Recent Labs    07/27/19 1111 07/28/19 0435  HGB 13.6 12.0   Recent Labs    07/27/19 1111 07/28/19 0435  WBC 9.3 12.4*  RBC 4.99 4.37  HCT 41.9 37.2  PLT 127* 132*   Recent Labs    07/27/19 1111 07/28/19 0435  NA  --  137  K  --  3.5  CL  --  101  CO2  --  26  BUN  --  18  CREATININE 0.71 0.79  GLUCOSE  --  114*  CALCIUM  --  8.7*   No results for input(s): LABPT, INR in the last 72 hours.  EXAM General - Patient is Alert, Appropriate and Oriented Extremity - Neurovascular intact Sensation intact distally Intact pulses distally Dorsiflexion/Plantar flexion intact No cellulitis present Compartment soft Dressing - dressing C/D/I and no drainage, prevena intact with scant drainage 100cc, stable Motor Function - intact, moving foot and toes well on exam.   Past Medical History:  Diagnosis Date  . Anxiety   . Arthritis   . Bruises easily   . Headache   . Hypertension   . Leg pain   . Pneumonia    about 5 yrs ago  . Sleep apnea     Assessment/Plan:   3 Days Post-Op Procedure(s) (LRB): LEFT TOTAL KNEE ARTHROPLASTY (Left) APPLICATION OF WOUND VAC (Left) Active Problems:   S/P TKR (total  knee replacement) using cement, left  Estimated body mass index is 38.67 kg/m as calculated from the following:   Height as of this encounter: 5\' 7"  (1.702 m).   Weight as of this encounter: 112 kg. Advance diet Up with therapy  VSS Pain well controlled this morning. CM to assist with discharge to Home with HHPT today   DVT Prophylaxis - Lovenox, Foot Pumps and TED hose Weight-Bearing as tolerated to left leg   T. Rachelle Hora, PA-C [place] 07/30/2019, 8:01 AM

## 2019-07-31 ENCOUNTER — Emergency Department (HOSPITAL_COMMUNITY)
Admission: EM | Admit: 2019-07-31 | Discharge: 2019-07-31 | Disposition: A | Discharge status: Home / Self Care | Payer: Managed Care, Other (non HMO) | Attending: Emergency Medicine | Admitting: Emergency Medicine

## 2019-07-31 ENCOUNTER — Other Ambulatory Visit: Payer: Self-pay

## 2019-07-31 DIAGNOSIS — Z4801 Encounter for change or removal of surgical wound dressing: Secondary | ICD-10-CM | POA: Insufficient documentation

## 2019-07-31 DIAGNOSIS — M96830 Postprocedural hemorrhage and hematoma of a musculoskeletal structure following a musculoskeletal system procedure: Secondary | ICD-10-CM | POA: Diagnosis present

## 2019-07-31 DIAGNOSIS — Z96652 Presence of left artificial knee joint: Secondary | ICD-10-CM | POA: Diagnosis not present

## 2019-07-31 DIAGNOSIS — I10 Essential (primary) hypertension: Secondary | ICD-10-CM | POA: Diagnosis not present

## 2019-07-31 DIAGNOSIS — D649 Anemia, unspecified: Secondary | ICD-10-CM | POA: Diagnosis not present

## 2019-07-31 DIAGNOSIS — Z4803 Encounter for change or removal of drains: Secondary | ICD-10-CM | POA: Diagnosis not present

## 2019-07-31 DIAGNOSIS — Z4889 Encounter for other specified surgical aftercare: Secondary | ICD-10-CM

## 2019-07-31 DIAGNOSIS — Z87891 Personal history of nicotine dependence: Secondary | ICD-10-CM | POA: Insufficient documentation

## 2019-07-31 LAB — CBC WITH DIFFERENTIAL/PLATELET
Abs Immature Granulocytes: 0.06 10*3/uL (ref 0.00–0.07)
Basophils Absolute: 0 10*3/uL (ref 0.0–0.1)
Basophils Relative: 0 %
Eosinophils Absolute: 0.1 10*3/uL (ref 0.0–0.5)
Eosinophils Relative: 1 %
HCT: 30.6 % — ABNORMAL LOW (ref 36.0–46.0)
Hemoglobin: 9.9 g/dL — ABNORMAL LOW (ref 12.0–15.0)
Immature Granulocytes: 1 %
Lymphocytes Relative: 14 %
Lymphs Abs: 1.5 10*3/uL (ref 0.7–4.0)
MCH: 28 pg (ref 26.0–34.0)
MCHC: 32.4 g/dL (ref 30.0–36.0)
MCV: 86.4 fL (ref 80.0–100.0)
Monocytes Absolute: 1.4 10*3/uL — ABNORMAL HIGH (ref 0.1–1.0)
Monocytes Relative: 13 %
Neutro Abs: 7.9 10*3/uL — ABNORMAL HIGH (ref 1.7–7.7)
Neutrophils Relative %: 71 %
Platelets: 101 10*3/uL — ABNORMAL LOW (ref 150–400)
RBC: 3.54 MIL/uL — ABNORMAL LOW (ref 3.87–5.11)
RDW: 15.8 % — ABNORMAL HIGH (ref 11.5–15.5)
WBC: 11 10*3/uL — ABNORMAL HIGH (ref 4.0–10.5)
nRBC: 0.2 % (ref 0.0–0.2)

## 2019-07-31 LAB — BASIC METABOLIC PANEL
Anion gap: 10 (ref 5–15)
BUN: 13 mg/dL (ref 8–23)
CO2: 27 mmol/L (ref 22–32)
Calcium: 8.4 mg/dL — ABNORMAL LOW (ref 8.9–10.3)
Chloride: 98 mmol/L (ref 98–111)
Creatinine, Ser: 0.76 mg/dL (ref 0.44–1.00)
GFR calc Af Amer: >60 mL/min (ref >60)
GFR calc non Af Amer: >60 mL/min (ref >60)
Glucose, Bld: 127 mg/dL — ABNORMAL HIGH (ref 70–99)
Potassium: 3.9 mmol/L (ref 3.5–5.1)
Sodium: 135 mmol/L (ref 135–145)

## 2019-07-31 MED ORDER — HYDROMORPHONE HCL 1 MG/ML IJ SOLN
1.0000 mg | Freq: Once | INTRAMUSCULAR | Status: AC
  Administered 2019-07-31: 1 mg via INTRAMUSCULAR
  Filled 2019-07-31: qty 1

## 2019-07-31 NOTE — ED Provider Notes (Signed)
Emergency Department Provider Note   I have reviewed the triage vital signs and the nursing notes.   HISTORY  Chief Complaint post op bleeding   HPI Jill Banks is a 63 y.o. female who presents to the ED today secondary to bleeding from her wound VAC.  She had a total knee a arthroplasty on the 10th.  She states that the day that she woke up and she noticed that there was blood leaking from her wound VAC into her bandage.  Presents here for further evaluation.  She has some pain associated with swelling but no other associated symptoms.  No other associated or modifying symptoms.    Past Medical History:  Diagnosis Date   Anxiety    Arthritis    Bruises easily    Headache    Hypertension    Leg pain    Pneumonia    about 5 yrs ago   Sleep apnea     Patient Active Problem List   Diagnosis Date Noted   S/P TKR (total knee replacement) using cement, left 07/27/2019   Encounter for general adult medical examination with abnormal findings 07/01/2019   Adjustment insomnia 07/01/2019   Encounter for screening mammogram for malignant neoplasm of breast 07/01/2019   Routine cervical smear 07/01/2019   Ventral hernia without obstruction or gangrene 07/01/2019   Chronic pain of right ankle 01/13/2019   Acute vaginitis 01/11/2018   Bacterial vaginitis 12/18/2017   Inflammatory polyarthritis ([place]) 12/18/2017   Impaired fasting glucose 09/23/2017   Obesity, unspecified 09/23/2017   Hypokalemia 09/23/2017   Melanocytic nevi, unspecified 09/23/2017   Pain in right knee 09/23/2017   Dysuria 09/23/2017   Pain in right ankle and joints of right foot 09/23/2017   Essential (primary) hypertension 09/23/2017   Other fatigue 09/23/2017   Vitamin D deficiency, unspecified 09/23/2017   Iron deficiency anemia, unspecified 09/23/2017   Obstructive sleep apnea, adult 09/23/2017    Past Surgical History:  Procedure Laterality Date   APPLICATION OF  WOUND VAC 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   Procedure: APPLICATION OF WOUND VAC;  Surgeon: Hessie Knows, MD;  Location: [facility] ORS;  Service: Orthopedics;  Laterality: Left;  [id number]   DILATION AND CURETTAGE OF UTERUS     x 3   TOE SURGERY Left    second toe   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   Procedure: LEFT TOTAL KNEE ARTHROPLASTY;  Surgeon: Hessie Knows, MD;  Location: [facility] OR[facility];  Service: Orthopedics;  Laterality: Left;    Current Outpatient Rx   Order #: [id number] Class: Normal   Order #: [id number] Class: Normal   Order #: [id number] Class: Normal   Order #: [id number] Class: Historical Med   Order #: [id number] Class: Print   Order #: [id number] Class: Print   Order #: [id number] Class: Normal   Order #: [id number] Class: Normal   Order #: [id number] Class: Historical Med   Order #: [id number] Class: Print   Order #: [id number] Class: Normal   Order #: [id number] Class: Print    Allergies Patient has no active allergies.  Family History  Problem Relation Age of Onset   Hypertension Father    Stroke Father    Hypertension Brother    Stroke Brother    Hyperlipidemia Brother    Asthma Son     Social History Social History   Tobacco Use   Smoking status: Former Smoker   Smokeless tobacco: Never Used  Substance Use Topics   Alcohol use: Yes    Comment: ocassionally   Drug use: No    Review of  Systems  All other systems negative except as documented in the HPI. All pertinent positives and negatives as reviewed in the HPI. ____________________________________________   PHYSICAL EXAM:  VITAL SIGNS: ED Triage Vitals  Enc Vitals Group     BP 07/31/19 0350 (!) 141/71     Pulse Rate 07/31/19 0356 (!) 103     Resp 07/31/19 0356 20     Temp 07/31/19 0356 100.3 F (37.9 C)     Temp Source 07/31/19 0356 Oral     SpO2 07/31/19 0356 93 %     Weight 07/31/19 0355 250 lb (113.4 kg)     Height 07/31/19 0355 5\' 7"  (1.702 m)   Constitutional: Alert and oriented.  Well appearing and in no acute distress. Eyes: Conjunctivae are normal. PERRL. EOMI. Head: Atraumatic. Nose: No congestion/rhinnorhea. Mouth/Throat: Mucous membranes are moist.  Oropharynx non-erythematous. Neck: No stridor.  No meningeal signs.   Cardiovascular: Normal rate, regular rhythm. Good peripheral circulation. Grossly normal heart sounds.   Respiratory: Normal respiratory effort.  No retractions. Lungs CTAB. Gastrointestinal: Soft and nontender. No distention.  Musculoskeletal: No lower extremity tenderness nor edema. No gross deformities of extremities. Neurologic:  Normal speech and language. No gross focal neurologic deficits are appreciated.  Skin:  Skin is warm, dry and intact. No rash noted.  Significantly edematous and ecchymotic left lower extremity.  One of the staples in the lower portion appears to have become dislodged causing oozing from that area.  The wound VAC container was full.  ____________________________________________   LABS (all labs ordered are listed, but only abnormal results are displayed)  Labs Reviewed  CBC WITH DIFFERENTIAL/PLATELET - Abnormal; Notable for the following components:      Result Value   WBC 11.0 (*)    RBC 3.54 (*)    Hemoglobin 9.9 (*)    HCT 30.6 (*)    RDW 15.8 (*)    Platelets 101 (*)    Neutro Abs 7.9 (*)    Monocytes Absolute 1.4 (*)    All other components within normal limits  BASIC METABOLIC PANEL - Abnormal; Notable for the following components:   Glucose, Bld 127 (*)    Calcium 8.4 (*)    All other components within normal limits   ____________________________________________  PROCEDURES  Procedure(s) performed:   Marland KitchenMarland KitchenLaceration Repair  Date/Time: 07/31/2019 5:53 AM Performed by: Merrily Pew, MD Authorized by: Merrily Pew, MD   Consent:    Consent obtained:  Verbal   Consent given by:  Patient   Risks discussed:  Infection, need for additional repair, nerve damage, poor wound healing, poor cosmetic  result, pain, retained foreign body, tendon damage and vascular damage   Alternatives discussed:  No treatment, delayed treatment and observation Anesthesia (see MAR for exact dosages):    Anesthesia method:  Local infiltration   Local anesthetic:  Lidocaine 1% w/o epi Laceration details:    Location:  Leg   Leg location:  L lower leg Repair type:    Repair type:  Simple Pre-procedure details:    Preparation:  Patient was prepped and draped in usual sterile fashion and imaging obtained to evaluate for foreign bodies Exploration:    Wound exploration: wound explored through full range of motion and entire depth of wound probed and visualized     Contaminated: no   Treatment:    Area cleansed with:  Saline   Amount of cleaning:  Extensive   Irrigation solution:  Sterile water Skin repair:    Repair method:  Staples   Number of staples:  1 Approximation:    Approximation:  Close Post-procedure details:    Dressing:  Antibiotic ointment, bulky dressing and non-adherent dressing   Patient tolerance of procedure:  Tolerated well, no immediate complications   ____________________________________________   INITIAL IMPRESSION / ASSESSMENT AND PLAN / ED COURSE  Wound VAC was malfunctioning because there was too much blood in the chamber filled.  The blood was oozing out from around the wound VAC bandage.  Discussed with orthopedics on-call, Dr. Posey Pronto, with [facility] who stated taken the wound VAC off and redress it as appropriate.  Upon removing it it did appear that one of the staples in the lower margin had become dislodged and not with it as it was coming from.  Staples replaced and bleeding stopped.  Wound was cared for and rewrapped by nursing.  We do not have wound vacs here so sterile clean dressings were applied.  We will follow-up with orthopedics if her hemoglobin is reasonable.  Clinical Course as of Jul 30 644  Sat Jul 31, 2019  0634 Platelets(!): 101 [JM]  [phone]  No indication for transfusion, likely normal given situation.  Hemoglobin(!): 9.9 [JM]    Clinical Course User Index [JM] Karima Carrell, Corene Cornea, MD   Observed over an hour after dressing placed with continued hemostasis. Labs ok. Stable for dc.   Pertinent labs & imaging results that were available during my care of the patient were reviewed by me and considered in my medical decision making (see chart for details).   A medical screening exam was performed and I feel the patient has had an appropriate workup for their chief complaint at this time and likelihood of emergent condition existing is low. They have been counseled on decision, discharge, follow up and which symptoms necessitate immediate return to the emergency department. They or their family verbally stated understanding and agreement with plan and discharged in stable condition.   ____________________________________________  FINAL CLINICAL IMPRESSION(S) / ED DIAGNOSES  Final diagnoses:  Encounter for postoperative wound care  Anemia, unspecified type     MEDICATIONS GIVEN DURING THIS VISIT:  Medications  HYDROmorphone (DILAUDID) injection 1 mg (1 mg Intramuscular Given 07/31/19 0426)     NEW OUTPATIENT MEDICATIONS STARTED DURING THIS VISIT:  New Prescriptions   No medications on file    Note:  This note was prepared with assistance of Dragon voice recognition software. Occasional wrong-word or sound-a-like substitutions may have occurred due to the inherent limitations of voice recognition software.   Jakala Herford, Corene Cornea, MD 07/31/19 [phone]

## 2019-07-31 NOTE — ED Triage Notes (Signed)
Pt had left knee replacement on Tuesday, went home Friday, states she awoke this am with bleeding coming through the dressing.

## 2019-08-03 ENCOUNTER — Other Ambulatory Visit: Payer: Self-pay | Admitting: Orthopedic Surgery

## 2019-08-30 ENCOUNTER — Telehealth: Payer: Self-pay

## 2019-08-30 NOTE — Telephone Encounter (Signed)
DISABILITY PARKING PLACARD SIGNED BY PROVIDER AND HANDED TO PATIENT ON 08-30-19.

## 2019-09-15 ENCOUNTER — Other Ambulatory Visit: Payer: Self-pay

## 2019-09-15 DIAGNOSIS — E876 Hypokalemia: Secondary | ICD-10-CM

## 2019-09-15 MED ORDER — POTASSIUM CHLORIDE CRYS ER 20 MEQ PO TBCR: 20.0000 meq | EXTENDED_RELEASE_TABLET | Freq: Two times a day (BID) | ORAL | 5 refills | Status: DC

## 2019-09-28 ENCOUNTER — Other Ambulatory Visit: Payer: Self-pay

## 2019-09-28 DIAGNOSIS — I10 Essential (primary) hypertension: Secondary | ICD-10-CM

## 2019-09-28 MED ORDER — BISOPROLOL FUMARATE 5 MG PO TABS: 5.0000 mg | ORAL_TABLET | Freq: Every day | ORAL | 2 refills | Status: DC

## 2019-10-08 ENCOUNTER — Telehealth: Payer: Self-pay

## 2019-10-08 NOTE — Telephone Encounter (Signed)
Confirmed virtual visit with patient. klh 

## 2019-10-12 ENCOUNTER — Ambulatory Visit: Payer: Managed Care, Other (non HMO) | Admitting: Internal Medicine

## 2019-10-12 ENCOUNTER — Encounter: Payer: Self-pay | Admitting: Internal Medicine

## 2019-10-12 VITALS — Ht 67.0 in | Wt 235.0 lb

## 2019-10-12 DIAGNOSIS — G4733 Obstructive sleep apnea (adult) (pediatric): Secondary | ICD-10-CM

## 2019-10-12 DIAGNOSIS — I10 Essential (primary) hypertension: Secondary | ICD-10-CM | POA: Diagnosis not present

## 2019-10-12 DIAGNOSIS — Z9989 Dependence on other enabling machines and devices: Secondary | ICD-10-CM

## 2019-10-12 DIAGNOSIS — E669 Obesity, unspecified: Secondary | ICD-10-CM

## 2019-10-12 NOTE — Progress Notes (Signed)
[facility] [place], [place] [address]  Internal MEDICINE  Telephone Visit  Patient Name: Jill Banks  [id number]  [id number]  Date of Service: 10/12/2019  I connected with the patient at 325 by telephone and verified the patients identity using two identifiers.   I discussed the limitations, risks, security and privacy concerns of performing an evaluation and management service by telephone and the availability of in person appointments. I also discussed with the patient that there may be a patient responsible charge related to the service.  The patient expressed understanding and agrees to proceed.    Chief Complaint  Patient presents with  . Telephone Assessment  . Telephone Screen  . Sleep Apnea    HPI  Pt is seen via video. She is following up on osa. Pt reports good compliance with CPAP therapy. Cleaning machine by hand, and changing filters and tubing as directed. Denies headaches, sinus issues, palpitations, or hemoptysis. Pt has a history of HTN, she reports it has been doing well.  Denies Chest pain, Shortness of breath, palpitations, headache, or blurred vision.     Current Medication: Outpatient Encounter Medications as of 10/12/2019  Medication Sig  . ALPRAZolam (XANAX) 0.25 MG tablet Take 1 tablet (0.25 mg total) by mouth at bedtime as needed for anxiety.  . bisoprolol (ZEBETA) 5 MG tablet Take 1 tablet (5 mg total) by mouth daily.  . calcitRIOL (ROCALTROL) 0.25 MCG capsule Take 1 capsule (0.25 mcg total) by mouth daily.  . diclofenac sodium (VOLTAREN) 1 % GEL Apply 2 g topically 4 (four) times daily as needed (knee pain).  Marland Kitchen docusate sodium (COLACE) 100 MG capsule Take 1 capsule (100 mg total) by mouth 2 (two) times daily.  . hydrochlorothiazide (HYDRODIURIL) 25 MG tablet Take 1 tablet (25 mg total) by mouth daily.  . irbesartan (AVAPRO) 300 MG tablet Take 1 tablet (300 mg total) by mouth daily.  . NON FORMULARY cpap device  . oxyCODONE  (OXY IR/ROXICODONE) 5 MG immediate release tablet Take 1-2 tablets (5-10 mg total) by mouth every 4 (four) hours as needed for moderate pain (pain score 4-6).  Marland Kitchen potassium chloride SA (KLOR-CON M20) 20 MEQ tablet Take 1 tablet (20 mEq total) by mouth 2 (two) times daily.  . traMADol (ULTRAM) 50 MG tablet Take 1 tablet (50 mg total) by mouth every 6 (six) hours.  . enoxaparin (LOVENOX) 40 MG/0.4ML injection Inject 0.4 mLs (40 mg total) into the skin daily for 14 days.   No facility-administered encounter medications on file as of 10/12/2019.    Surgical History: Past Surgical History:  Procedure Laterality Date  . APPLICATION OF WOUND VAC 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   Procedure: APPLICATION OF WOUND VAC;  Surgeon: Hessie Knows, MD;  Location: [facility] ORS;  Service: Orthopedics;  Laterality: Left;  [id number]  . DILATION AND CURETTAGE OF UTERUS     x 3  . TOE SURGERY Left    second toe  . 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 Anxiety   . Arthritis   . Bruises easily   . Headache   . Hypertension   . Leg pain   . Pneumonia    about 5 yrs ago  . Sleep apnea     Family History: Family History  Problem Relation Age of Onset  .  Hypertension Father   . Stroke Father   . Hypertension Brother   . Stroke Brother   . Hyperlipidemia Brother   . Asthma Son     Social History   Socioeconomic History  . Marital status: Married    Spouse name: Not on file  . Number of children: Not on file  . Years of education: Not on file  . Highest education level: Not on file  Occupational History  . Not on file  Tobacco Use  . Smoking status: Former Research scientist (life sciences)  . Smokeless tobacco: Never Used  Substance and Sexual Activity  . Alcohol use: Yes    Comment: ocassionally  . Drug use: No  . Sexual activity: Yes   Other Topics Concern  . Not on file  Social History Narrative  . Not on file   Social Determinants of Health   Financial Resource Strain:   . Difficulty of Paying Living Expenses: Not on file  Food Insecurity:   . Worried About Charity fundraiser in the Last Year: Not on file  . Ran Out of Food in the Last Year: Not on file  Transportation Needs:   . Lack of Transportation (Medical): Not on file  . Lack of Transportation (Non-Medical): Not on file  Physical Activity:   . Days of Exercise per Week: Not on file  . Minutes of Exercise per Session: Not on file  Stress:   . Feeling of Stress : Not on file  Social Connections:   . Frequency of Communication with Friends and Family: Not on file  . Frequency of Social Gatherings with Friends and Family: Not on file  . Attends Religious Services: Not on file  . Active Member of Clubs or Organizations: Not on file  . Attends Archivist Meetings: Not on file  . Marital Status: Not on file  Intimate Partner Violence:   . Fear of Current or Ex-Partner: Not on file  . Emotionally Abused: Not on file  . Physically Abused: Not on file  . Sexually Abused: Not on file      Review of Systems  Constitutional: Negative for chills, fatigue and unexpected weight change.  HENT: Negative for congestion, rhinorrhea, sneezing and sore throat.   Eyes: Negative for photophobia, pain and redness.  Respiratory: Negative for cough, chest tightness and shortness of breath.   Cardiovascular: Negative for chest pain and palpitations.  Gastrointestinal: Negative for abdominal pain, constipation, diarrhea, nausea and vomiting.  Endocrine: Negative.   Genitourinary: Negative for dysuria and frequency.  Musculoskeletal: Negative for arthralgias, back pain, joint swelling and neck pain.  Skin: Negative for rash.  Allergic/Immunologic: Negative.   Neurological: Negative for tremors and numbness.  Hematological: Negative for adenopathy. Does not  bruise/bleed easily.  Psychiatric/Behavioral: Negative for behavioral problems and sleep disturbance. The patient is not nervous/anxious.     Vital Signs: Ht 5\' 7"  (1.702 m)   Wt 235 lb (106.6 kg)   BMI 36.81 kg/m    Observation/Objective: Well appearing, NAD noted.    Assessment/Plan: 1. OSA on CPAP Continue to use cpap as directed. Good relief of symptoms.    2. Essential hypertension Controlled, per patient, continue present management.   3. Obesity, unspecified classification, unspecified obesity type, unspecified whether serious comorbidity present Obesity Counseling: Risk Assessment: An assessment of behavioral risk factors was made today and includes lack of exercise sedentary lifestyle, lack of portion control and poor dietary habits.  Risk Modification Advice: She was counseled on portion control guidelines. Restricting  daily caloric intake to 1800. The detrimental long term effects of obesity on her health and ongoing poor compliance was also discussed with the patient.    General Counseling: yasaman kozub understanding of the findings of today's phone visit and agrees with plan of treatment. I have discussed any further diagnostic evaluation that may be needed or ordered today. We also reviewed her medications today. she has been encouraged to call the office with any questions or concerns that should arise related to todays visit.    No orders of the defined types were placed in this encounter.   No orders of the defined types were placed in this encounter.   Time spent: 20 Minutes  This patient was seen by Orson Gear AGNP-C in Collaboration with Dr. Devona Konig as a part of collaborative care agreement.   Orson Gear AGNP-C Pulmonary medicine

## 2019-10-20 ENCOUNTER — Telehealth: Payer: Self-pay

## 2019-10-20 NOTE — Telephone Encounter (Signed)
Called confirmed virtual visit on 10/22/2019. klh

## 2019-10-22 ENCOUNTER — Other Ambulatory Visit: Payer: Self-pay

## 2019-10-22 ENCOUNTER — Encounter: Payer: Self-pay | Admitting: Nurse Practitioner

## 2019-10-22 ENCOUNTER — Ambulatory Visit: Payer: Managed Care, Other (non HMO) | Admitting: Nurse Practitioner

## 2019-10-22 VITALS — Ht 67.0 in | Wt 237.8 lb

## 2019-10-22 DIAGNOSIS — F5102 Adjustment insomnia: Secondary | ICD-10-CM | POA: Diagnosis not present

## 2019-10-22 DIAGNOSIS — G4733 Obstructive sleep apnea (adult) (pediatric): Secondary | ICD-10-CM | POA: Diagnosis not present

## 2019-10-22 DIAGNOSIS — I10 Essential (primary) hypertension: Secondary | ICD-10-CM | POA: Diagnosis not present

## 2019-10-22 NOTE — Progress Notes (Signed)
[facility] [place], [place] [address]  Internal MEDICINE  Telephone Visit  Patient Name: Jill Banks  [id number]  [id number]  Date of Service: 10/24/2019  I connected with the patient at 4:20pm by webcam and verified the patients identity using two identifiers.   I discussed the limitations, risks, security and privacy concerns of performing an evaluation and management service by webcam and the availability of in person appointments. I also discussed with the patient that there may be a patient responsible charge related to the service.  The patient expressed understanding and agrees to proceed.    Chief Complaint  Patient presents with  . Telephone Assessment  . Telephone Screen  . Hypertension  . Follow-up    high bllod pressure last week at wellness check up at job 166/104    The patient has been contacted via webcam for follow up visit due to concerns for spread of novel coronavirus. She presents for routine visit. The patient was diagnosed with COVID last week. She had symptoms such as nasal congestion, sore throat, headache, low grade fever, and lost her taste and smell. She is gradually improving. She is scheduled to go back to work next Monday.  About the time she started having symptoms for COVID 19, she had wellness check at work. Her blood pressure was mildly elevated.  States that her COVID 19 symptoms had started that day and she had taken decongestant medication. Her blood pressure was not rechecked after that. Blood pressure generally running in 140s over 80s.       Current Medication: Outpatient Encounter Medications as of 10/22/2019  Medication Sig  . ALPRAZolam (XANAX) 0.25 MG tablet Take 1 tablet (0.25 mg total) by mouth at bedtime as needed for anxiety.  . bisoprolol (ZEBETA) 5 MG tablet Take 1 tablet (5 mg total) by mouth daily.  . calcitRIOL (ROCALTROL) 0.25 MCG capsule Take 1 capsule (0.25 mcg total) by mouth daily.  . diclofenac  sodium (VOLTAREN) 1 % GEL Apply 2 g topically 4 (four) times daily as needed (knee pain).  Marland Kitchen docusate sodium (COLACE) 100 MG capsule Take 1 capsule (100 mg total) by mouth 2 (two) times daily.  . hydrochlorothiazide (HYDRODIURIL) 25 MG tablet Take 1 tablet (25 mg total) by mouth daily.  . irbesartan (AVAPRO) 300 MG tablet Take 1 tablet (300 mg total) by mouth daily.  . NON FORMULARY cpap device  . oxyCODONE (OXY IR/ROXICODONE) 5 MG immediate release tablet Take 1-2 tablets (5-10 mg total) by mouth every 4 (four) hours as needed for moderate pain (pain score 4-6).  Marland Kitchen potassium chloride SA (KLOR-CON M20) 20 MEQ tablet Take 1 tablet (20 mEq total) by mouth 2 (two) times daily.  . traMADol (ULTRAM) 50 MG tablet Take 1 tablet (50 mg total) by mouth every 6 (six) hours.  . enoxaparin (LOVENOX) 40 MG/0.4ML injection Inject 0.4 mLs (40 mg total) into the skin daily for 14 days.   No facility-administered encounter medications on file as of 10/22/2019.    Surgical History: Past Surgical History:  Procedure Laterality Date  . APPLICATION OF WOUND VAC 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   Procedure: APPLICATION OF WOUND VAC;  Surgeon: Hessie Knows, MD;  Location: [facility] ORS;  Service: Orthopedics;  Laterality: Left;  [id number]  . DILATION AND CURETTAGE OF UTERUS     x 3  . TOE SURGERY Left    second toe  . 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   Procedure: LEFT TOTAL KNEE ARTHROPLASTY;  Surgeon: Rudene Christians,  Legrand Como, MD;  Location: [facility] ORS;  Service: Orthopedics;  Laterality: Left;    Medical History: Past Medical History:  Diagnosis Date  . Anxiety   . Arthritis   . Bruises easily   . Headache   . Hypertension   . Leg pain   . Pneumonia    about 5 yrs ago  . Sleep apnea     Family History: Family History  Problem Relation Age of Onset  . Hypertension Father   . Stroke Father   . Hypertension Brother   . Stroke Brother   . Hyperlipidemia Brother   . Asthma Son     Social History   Socioeconomic  History  . Marital status: Married    Spouse name: Not on file  . Number of children: Not on file  . Years of education: Not on file  . Highest education level: Not on file  Occupational History  . Not on file  Tobacco Use  . Smoking status: Former Research scientist (life sciences)  . Smokeless tobacco: Never Used  Substance and Sexual Activity  . Alcohol use: Yes    Comment: ocassionally  . Drug use: No  . Sexual activity: Yes  Other Topics Concern  . Not on file  Social History Narrative  . Not on file   Social Determinants of Health   Financial Resource Strain:   . Difficulty of Paying Living Expenses: Not on file  Food Insecurity:   . Worried About Charity fundraiser in the Last Year: Not on file  . Ran Out of Food in the Last Year: Not on file  Transportation Needs:   . Lack of Transportation (Medical): Not on file  . Lack of Transportation (Non-Medical): Not on file  Physical Activity:   . Days of Exercise per Week: Not on file  . Minutes of Exercise per Session: Not on file  Stress:   . Feeling of Stress : Not on file  Social Connections:   . Frequency of Communication with Friends and Family: Not on file  . Frequency of Social Gatherings with Friends and Family: Not on file  . Attends Religious Services: Not on file  . Active Member of Clubs or Organizations: Not on file  . Attends Archivist Meetings: Not on file  . Marital Status: Not on file  Intimate Partner Violence:   . Fear of Current or Ex-Partner: Not on file  . Emotionally Abused: Not on file  . Physically Abused: Not on file  . Sexually Abused: Not on file      Review of Systems  Constitutional: Positive for fatigue. Negative for activity change, appetite change and unexpected weight change.  HENT: Positive for congestion. Negative for postnasal drip, rhinorrhea and sinus pressure.   Respiratory: Negative for shortness of breath and wheezing.   Cardiovascular: Negative for chest pain and palpitations.        Improved blood pressure today.   Gastrointestinal: Negative for constipation, diarrhea, nausea and vomiting.  Endocrine: Negative for cold intolerance, heat intolerance, polydipsia and polyuria.  Musculoskeletal: Negative for arthralgias and myalgias.  Skin: Negative for rash.  Allergic/Immunologic: Negative for environmental allergies.  Neurological: Positive for headaches. Negative for dizziness.       Headaches are less severe and less frequent since starting on bisoprolol   Hematological: Negative for adenopathy. Does not bruise/bleed easily.  Psychiatric/Behavioral: Positive for sleep disturbance. Negative for agitation and decreased concentration. The patient is nervous/anxious.     Today's Vitals   10/22/19 1529  Weight: 237 lb 12.8 oz (107.9 kg)  Height: 5\' 7"  (1.702 m)   Body mass index is 37.24 kg/m.  Observation/Objective:   The patient is alert and oriented. She is pleasant and answers all questions appropriately. Breathing is non-labored. She is in no acute distress at this time.    Assessment/Plan: 1. Essential hypertension Blood pressure is generally stable. Continue meds as prescribed. Patient to purchase home blood pressure cuff to monitor more closely. Will adjust medication at next, in-office visit.   2. Adjustment insomnia Currently stable. May continue alprazolam 0.25mg  at bedtime as needed.   3. Obstructive sleep apnea, adult Continue regular visits with Dr. Devona Konig as scheduled for CPAP management.   General Counseling: juline lucca understanding of the findings of today's phone visit and agrees with plan of treatment. I have discussed any further diagnostic evaluation that may be needed or ordered today. We also reviewed her medications today. she has been encouraged to call the office with any questions or concerns that should arise related to todays visit.   Hypertension Counseling:   The following hypertensive lifestyle modification were  recommended and discussed:  1. Limiting alcohol intake to less than 1 oz/day of ethanol:(24 oz of beer or 8 oz of wine or 2 oz of 100-proof whiskey). 2. Take baby ASA 81 mg daily. 3. Importance of regular aerobic exercise and losing weight. 4. Reduce dietary saturated fat and cholesterol intake for overall cardiovascular health. 5. Maintaining adequate dietary potassium, calcium, and magnesium intake. 6. Regular monitoring of the blood pressure. 7. Reduce sodium intake to less than 100 mmol/day (less than 2.3 gm of sodium or less than 6 gm of sodium choride)   This patient was seen by [place] with Dr Lavera Guise as a part of collaborative care agreement   Time spent: 25 Minutes    Dr Lavera Guise Internal medicine

## 2020-01-03 ENCOUNTER — Other Ambulatory Visit: Payer: Self-pay

## 2020-01-03 DIAGNOSIS — I10 Essential (primary) hypertension: Secondary | ICD-10-CM

## 2020-01-03 MED ORDER — BISOPROLOL FUMARATE 5 MG PO TABS: 5.0000 mg | ORAL_TABLET | Freq: Every day | ORAL | 3 refills | Status: DC

## 2020-04-07 ENCOUNTER — Telehealth: Payer: Self-pay

## 2020-04-07 NOTE — Telephone Encounter (Signed)
Confirmed appointment on 04/11/2020 and screened for covid. klh

## 2020-04-10 ENCOUNTER — Other Ambulatory Visit: Payer: Self-pay

## 2020-04-10 DIAGNOSIS — I10 Essential (primary) hypertension: Secondary | ICD-10-CM

## 2020-04-10 MED ORDER — BISOPROLOL FUMARATE 5 MG PO TABS: 5.0000 mg | ORAL_TABLET | Freq: Every day | ORAL | 3 refills | Status: DC

## 2020-04-11 ENCOUNTER — Ambulatory Visit: Payer: Managed Care, Other (non HMO) | Admitting: Internal Medicine

## 2020-04-11 ENCOUNTER — Encounter: Payer: Self-pay | Admitting: Internal Medicine

## 2020-04-11 ENCOUNTER — Other Ambulatory Visit: Payer: Self-pay

## 2020-04-11 VITALS — BP 168/90 | HR 71 | Temp 97.5°F | Resp 16 | Ht 67.0 in | Wt 250.0 lb

## 2020-04-11 DIAGNOSIS — E669 Obesity, unspecified: Secondary | ICD-10-CM

## 2020-04-11 DIAGNOSIS — Z9989 Dependence on other enabling machines and devices: Secondary | ICD-10-CM | POA: Diagnosis not present

## 2020-04-11 DIAGNOSIS — G4733 Obstructive sleep apnea (adult) (pediatric): Secondary | ICD-10-CM | POA: Diagnosis not present

## 2020-04-11 DIAGNOSIS — I10 Essential (primary) hypertension: Secondary | ICD-10-CM | POA: Diagnosis not present

## 2020-04-11 NOTE — Progress Notes (Signed)
[facility] [place], [place] [address]  Pulmonary Sleep Medicine   Office Visit Note  Patient Name: Jill Banks DOB: 09/14/1956 MRN [id number]  Date of Service: 04/11/2020  Complaints/HPI: patient is here for pulm follow up on osa.  She has been using her machine. Pt reports good compliance with CPAP therapy. Cleaning machine by hand, and changing filters and tubing as directed. Denies headaches, sinus issues, palpitations, or hemoptysis.  She is interested in talking to someone about the inspire implantable device to help with OSA.  Her blood pressure is elevated today, she has plans to see her pcp soon for change in medication.   ROS  General: (-) fever, (-) chills, (-) night sweats, (-) weakness Skin: (-) rashes, (-) itching,. Eyes: (-) visual changes, (-) redness, (-) itching. Nose and Sinuses: (-) nasal stuffiness or itchiness, (-) postnasal drip, (-) nosebleeds, (-) sinus trouble. Mouth and Throat: (-) sore throat, (-) hoarseness. Neck: (-) swollen glands, (-) enlarged thyroid, (-) neck pain. Respiratory: - cough, (-) bloody sputum, - shortness of breath, - wheezing. Cardiovascular: - ankle swelling, (-) chest pain. Lymphatic: (-) lymph node enlargement. Neurologic: (-) numbness, (-) tingling. Psychiatric: (-) anxiety, (-) depression   Current Medication: Outpatient Encounter Medications as of 04/11/2020  Medication Sig  . ALPRAZolam (XANAX) 0.25 MG tablet Take 1 tablet (0.25 mg total) by mouth at bedtime as needed for anxiety.  . bisoprolol (ZEBETA) 5 MG tablet Take 1 tablet (5 mg total) by mouth daily.  . calcitRIOL (ROCALTROL) 0.25 MCG capsule Take 1 capsule (0.25 mcg total) by mouth daily.  . diclofenac sodium (VOLTAREN) 1 % GEL Apply 2 g topically 4 (four) times daily as needed (knee pain).  Marland Kitchen docusate sodium (COLACE) 100 MG capsule Take 1 capsule (100 mg total) by mouth 2 (two) times daily.  . hydrochlorothiazide (HYDRODIURIL) 25 MG tablet  Take 1 tablet (25 mg total) by mouth daily.  . irbesartan (AVAPRO) 300 MG tablet Take 1 tablet (300 mg total) by mouth daily.  . NON FORMULARY cpap device  . oxyCODONE (OXY IR/ROXICODONE) 5 MG immediate release tablet Take 1-2 tablets (5-10 mg total) by mouth every 4 (four) hours as needed for moderate pain (pain score 4-6).  Marland Kitchen potassium chloride SA (KLOR-CON M20) 20 MEQ tablet Take 1 tablet (20 mEq total) by mouth 2 (two) times daily.  . traMADol (ULTRAM) 50 MG tablet Take 1 tablet (50 mg total) by mouth every 6 (six) hours.  . enoxaparin (LOVENOX) 40 MG/0.4ML injection Inject 0.4 mLs (40 mg total) into the skin daily for 14 days.   No facility-administered encounter medications on file as of 04/11/2020.    Surgical History: Past Surgical History:  Procedure Laterality Date  . APPLICATION OF WOUND VAC 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   Procedure: APPLICATION OF WOUND VAC;  Surgeon: Hessie Knows, MD;  Location: [facility] ORS;  Service: Orthopedics;  Laterality: Left;  #[id number]  . DILATION AND CURETTAGE OF UTERUS     x 3  . TOE SURGERY Left    second toe  . 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 Anxiety   . Arthritis   . Bruises easily   . Headache   . Hypertension   . Leg pain   . Pneumonia    about 5 yrs ago  .  Sleep apnea     Family History: Family History  Problem Relation Age of Onset  . Hypertension Father   . Stroke Father   . Hypertension Brother   . Stroke Brother   . Hyperlipidemia Brother   . Asthma Son     Social History: Social History   Socioeconomic History  . Marital status: Married    Spouse name: Not on file  . Number of children: Not on file  . Years of education: Not on file  . Highest education level: Not on file  Occupational History  . Not on file  Tobacco Use   . Smoking status: Former Research scientist (life sciences)  . Smokeless tobacco: Never Used  Vaping Use  . Vaping Use: Never used  Substance and Sexual Activity  . Alcohol use: Yes    Comment: ocassionally  . Drug use: No  . Sexual activity: Yes  Other Topics Concern  . Not on file  Social History Narrative  . Not on file   Social Determinants of Health   Financial Resource Strain:   . Difficulty of Paying Living Expenses:   Food Insecurity:   . Worried About Charity fundraiser in the Last Year:   . Arboriculturist in the Last Year:   Transportation Needs:   . Film/video editor (Medical):   Marland Kitchen Lack of Transportation (Non-Medical):   Physical Activity:   . Days of Exercise per Week:   . Minutes of Exercise per Session:   Stress:   . Feeling of Stress :   Social Connections:   . Frequency of Communication with Friends and Family:   . Frequency of Social Gatherings with Friends and Family:   . Attends Religious Services:   . Active Member of Clubs or Organizations:   . Attends Archivist Meetings:   Marland Kitchen Marital Status:   Intimate Partner Violence:   . Fear of Current or Ex-Partner:   . Emotionally Abused:   Marland Kitchen Physically Abused:   . Sexually Abused:     Vital Signs: Blood pressure (!) 183/93, pulse 71, temperature (!) 97.5 F (36.4 C), resp. rate 16, height 5\' 7"  (1.702 m), weight (!) 250 lb (113.4 kg), SpO2 97 %.  Examination: General Appearance: The patient is well-developed, well-nourished, and in no distress. Skin: Gross inspection of skin unremarkable. Head: normocephalic, no gross deformities. Eyes: no gross deformities noted. ENT: ears appear grossly normal no exudates. Neck: Supple. No thyromegaly. No LAD. Respiratory: clear bilaterally.. Cardiovascular: Normal S1 and S2 without murmur or rub. Extremities: No cyanosis. pulses are equal. Neurologic: Alert and oriented. No involuntary movements.  LABS: No results found for this or any previous visit (from the past  2160 hour(s)).  Radiology: No results found.  No results found.  No results found.    Assessment and Plan: Patient Active Problem List   Diagnosis Date Noted  . S/P TKR (total knee replacement) using cement, left 07/27/2019  . Encounter for general adult medical examination with abnormal findings 07/01/2019  . Adjustment insomnia 07/01/2019  . Encounter for screening mammogram for malignant neoplasm of breast 07/01/2019  . Routine cervical smear 07/01/2019  . Ventral hernia without obstruction or gangrene 07/01/2019  . Chronic pain of right ankle 01/13/2019  . Acute vaginitis 01/11/2018  . Bacterial vaginitis 12/18/2017  . Inflammatory polyarthritis ([place]) 12/18/2017  . Impaired fasting glucose 09/23/2017  . Obesity, unspecified 09/23/2017  . Hypokalemia 09/23/2017  . Melanocytic nevi, unspecified 09/23/2017  . Pain in right knee 09/23/2017  .  Dysuria 09/23/2017  . Pain in right ankle and joints of right foot 09/23/2017  . Essential hypertension 09/23/2017  . Other fatigue 09/23/2017  . Vitamin D deficiency, unspecified 09/23/2017  . Iron deficiency anemia, unspecified 09/23/2017  . Obstructive sleep apnea, adult 09/23/2017    1. OSA on CPAP Continue to use cpap nightly as prescribed.   2. Essential hypertension Discussed importance of medication compliance.  Encouraged her to continue to work with PCP as planned for medication mgmt.     3. Obesity, unspecified classification, unspecified obesity type, unspecified whether serious comorbidity present Obesity Counseling: Risk Assessment: An assessment of behavioral risk factors was made today and includes lack of exercise sedentary lifestyle, lack of portion control and poor dietary habits.  Risk Modification Advice: She was counseled on portion control guidelines. Restricting daily caloric intake to 1600. The detrimental long term effects of obesity on her health and ongoing poor compliance was also discussed with the  patient.    General Counseling: I have discussed the findings of the evaluation and examination with Jill Banks.  I have also discussed any further diagnostic evaluation thatmay be needed or ordered today. Jill Banks verbalizes understanding of the findings of todays visit. We also reviewed her medications today and discussed drug interactions and side effects including but not limited excessive drowsiness and altered mental states. We also discussed that there is always a risk not just to her but also people around her. she has been encouraged to call the office with any questions or concerns that should arise related to todays visit.  No orders of the defined types were placed in this encounter.    Time spent: 25 This patient was seen by Orson Gear AGNP-C in Collaboration with Dr. Devona Konig as a part of collaborative care agreement.   I have personally obtained a history, examined the patient, evaluated laboratory and imaging results, formulated the assessment and plan and placed orders.    Allyne Gee, MD [facility] Pulmonary and Critical Care Sleep medicine

## 2020-04-14 ENCOUNTER — Telehealth: Payer: Self-pay

## 2020-04-14 NOTE — Telephone Encounter (Signed)
Confirmed and screened for 04-18-20 ov. 

## 2020-04-18 ENCOUNTER — Ambulatory Visit: Payer: Managed Care, Other (non HMO) | Admitting: Hospice and Palliative Medicine

## 2020-04-18 ENCOUNTER — Encounter: Payer: Self-pay | Admitting: Nurse Practitioner

## 2020-04-18 ENCOUNTER — Other Ambulatory Visit: Payer: Self-pay

## 2020-04-18 DIAGNOSIS — Z6839 Body mass index (BMI) 39.0-39.9, adult: Secondary | ICD-10-CM

## 2020-04-18 DIAGNOSIS — I10 Essential (primary) hypertension: Secondary | ICD-10-CM

## 2020-04-18 MED ORDER — AMLODIPINE BESYLATE 2.5 MG PO TABS: 2.5000 mg | ORAL_TABLET | Freq: Every day | ORAL | 1 refills | Status: DC

## 2020-04-18 NOTE — Progress Notes (Addendum)
[facility] [place], [place] [address]  Internal MEDICINE  Office Visit Note  Patient Name: Jill Banks  [id number]  [id number]  Date of Service: 04/22/2020  Chief Complaint  Patient presents with  . Follow-up  . Hypertension    HPI Patient is here today for routine follow-up. Blood pressure consistently high and remains elevated today. At work her BP is checked routinely and is usually elevated.. Will purchase a machine today in order to monitor more routinely and encouraged to bring a log of BP readings to follow-up visits. Frequent headaches and floaters in her vision noted. Denies chest pain, shortness of breath or palpitations.  Current Medication: Outpatient Encounter Medications as of 04/18/2020  Medication Sig  . ALPRAZolam (XANAX) 0.25 MG tablet Take 1 tablet (0.25 mg total) by mouth at bedtime as needed for anxiety.  . bisoprolol (ZEBETA) 5 MG tablet Take 1 tablet (5 mg total) by mouth daily.  . calcitRIOL (ROCALTROL) 0.25 MCG capsule Take 1 capsule (0.25 mcg total) by mouth daily.  . diclofenac sodium (VOLTAREN) 1 % GEL Apply 2 g topically 4 (four) times daily as needed (knee pain).  Marland Kitchen docusate sodium (COLACE) 100 MG capsule Take 1 capsule (100 mg total) by mouth 2 (two) times daily.  . hydrochlorothiazide (HYDRODIURIL) 25 MG tablet Take 1 tablet (25 mg total) by mouth daily.  . irbesartan (AVAPRO) 300 MG tablet Take 1 tablet (300 mg total) by mouth daily.  . NON FORMULARY cpap device  . oxyCODONE (OXY IR/ROXICODONE) 5 MG immediate release tablet Take 1-2 tablets (5-10 mg total) by mouth every 4 (four) hours as needed for moderate pain (pain score 4-6).  Marland Kitchen potassium chloride SA (KLOR-CON M20) 20 MEQ tablet Take 1 tablet (20 mEq total) by mouth 2 (two) times daily.  . traMADol (ULTRAM) 50 MG tablet Take 1 tablet (50 mg total) by mouth every 6 (six) hours.  Marland Kitchen amLODipine (NORVASC) 2.5 MG tablet Take 1 tablet (2.5 mg total) by mouth daily.  Marland Kitchen  enoxaparin (LOVENOX) 40 MG/0.4ML injection Inject 0.4 mLs (40 mg total) into the skin daily for 14 days.   No facility-administered encounter medications on file as of 04/18/2020.    Surgical History: Past Surgical History:  Procedure Laterality Date  . APPLICATION OF WOUND VAC 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   Procedure: APPLICATION OF WOUND VAC;  Surgeon: Hessie Knows, MD;  Location: [facility] ORS;  Service: Orthopedics;  Laterality: Left;  #[id number]  . DILATION AND CURETTAGE OF UTERUS     x 3  . TOE SURGERY Left    second toe  . 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 Anxiety   . Arthritis   . Bruises easily   . Headache   . Hypertension   . Leg pain   . Pneumonia    about 5 yrs ago  . Sleep apnea     Family History: Family History  Problem Relation Age of Onset  . Hypertension Father   . Stroke Father   . Hypertension Brother   . Stroke Brother   . Hyperlipidemia Brother   . Asthma Son     Social History   Socioeconomic History  . Marital status: Married    Spouse name: Not on file  . Number of children: Not on file  . Years  of education: Not on file  . Highest education level: Not on file  Occupational History  . Not on file  Tobacco Use  . Smoking status: Former Research scientist (life sciences)  . Smokeless tobacco: Never Used  Vaping Use  . Vaping Use: Never used  Substance and Sexual Activity  . Alcohol use: Yes    Comment: ocassionally  . Drug use: No  . Sexual activity: Yes  Other Topics Concern  . Not on file  Social History Narrative  . Not on file   Social Determinants of Health   Financial Resource Strain:   . Difficulty of Paying Living Expenses:   Food Insecurity:   . Worried About Charity fundraiser in the Last Year:   . Arboriculturist in the Last Year:   Transportation Needs:    . Film/video editor (Medical):   Marland Kitchen Lack of Transportation (Non-Medical):   Physical Activity:   . Days of Exercise per Week:   . Minutes of Exercise per Session:   Stress:   . Feeling of Stress :   Social Connections:   . Frequency of Communication with Friends and Family:   . Frequency of Social Gatherings with Friends and Family:   . Attends Religious Services:   . Active Member of Clubs or Organizations:   . Attends Archivist Meetings:   Marland Kitchen Marital Status:   Intimate Partner Violence:   . Fear of Current or Ex-Partner:   . Emotionally Abused:   Marland Kitchen Physically Abused:   . Sexually Abused:       Review of Systems  Constitutional: Negative.        For chills, fever, fatigue.  HENT: Negative.        For sinus pain, sinus pressure, sore throat, trouble swallowing.  Eyes: Positive for visual disturbance.       Negative for changes in vision or visual disturbances.  Respiratory: Negative.        For chest tightness, cough, shortness of breath, wheezing.  Cardiovascular: Negative.        For chest pain, ankle swelling, palpitations.  Gastrointestinal: Negative.        For abdominal pain, constipation, nausea, vomiting, diarrhea.  Endocrine: Negative.        For polydipsia, polyphagia, polyuria.  Genitourinary: Negative.        For dysuria, flank pain, hematuria, increased frequency, urgency.  Musculoskeletal: Negative.        For arthralgias, myalgias, back pain, neck pain, gait disturbances.  Skin: Negative.        For rash, wound.  Allergic/Immunologic: Negative.   Neurological: Positive for headaches.       Negative for dizziness, headaches, tremors, weakness.  Hematological: Negative.   Psychiatric/Behavioral: Negative.        For confusion, depression, anxiety, sleep disturbances.    Vital Signs: BP (!) 160/80   Pulse 73   Temp (!) 97.4 F (36.3 C)   Resp 16   Ht 5\' 7"  (1.702 m)   Wt 250 lb (113.4 kg)   SpO2 95%   BMI 39.16 kg/m     Physical Exam Vitals reviewed.  Constitutional:      Appearance: Normal appearance. She is obese.  HENT:     Mouth/Throat:     Mouth: Mucous membranes are moist.     Pharynx: Oropharynx is clear.  Cardiovascular:     Rate and Rhythm: Normal rate and regular rhythm.     Pulses: Normal pulses.  Heart sounds: Normal heart sounds.  Pulmonary:     Effort: Pulmonary effort is normal.     Breath sounds: Normal breath sounds.  Abdominal:     General: Abdomen is flat. Bowel sounds are normal.     Palpations: Abdomen is soft.  Musculoskeletal:        General: Normal range of motion.     Cervical back: Normal range of motion.  Skin:    General: Skin is warm.  Neurological:     General: No focal deficit present.     Mental Status: She is alert and oriented to person, place, and time. Mental status is at baseline.  Psychiatric:        Mood and Affect: Mood normal.        Thought Content: Thought content normal.    Assessment/Plan: 1. Essential hypertension Will add amlodipine to her daily regimen. Encouraged to take BP routinely at home and keep log. Advised to take 2 tablets for total of 5mg  if SBP >150. Will have her follow-up in 1 week to assess response to added therapy. - amLODipine (NORVASC) 2.5 MG tablet; Take 1 tablet (2.5 mg total) by mouth daily.  Dispense: 30 tablet; Pt is noted to be tachycardiac ads well, might need echo -  2. BMI 39.0-39.9,adult Encouraged to incorporate diet and exercise into daily routine. Discussed importance of a healthy weight. Discussed the negative impact obesity has on her HTN and overall health.  Hypertension Counseling:   The following hypertensive lifestyle modification were recommended and discussed:  1. Limiting alcohol intake to less than 1 oz/day of ethanol:(24 oz of beer or 8 oz of wine or 2 oz of 100-proof whiskey). 2. Take baby ASA 81 mg daily. 3. Importance of regular aerobic exercise and losing weight. 4. Reduce dietary  saturated fat and cholesterol intake for overall cardiovascular health. 5. Maintaining adequate dietary potassium, calcium, and magnesium intake. 6. Regular monitoring of the blood pressure. 7. Reduce sodium intake to less than 100 mmol/day (less than 2.3 gm of sodium or less than 6 gm of sodium choride)   General Counseling: Vaani verbalizes understanding of the findings of todays visit and agrees with plan of treatment. I have discussed any further diagnostic evaluation that may be needed or ordered today. We also reviewed her medications today. she has been encouraged to call the office with any questions or concerns that should arise related to todays visit.  Meds ordered this encounter  Medications  . amLODipine (NORVASC) 2.5 MG tablet    Sig: Take 1 tablet (2.5 mg total) by mouth daily.    Dispense:  30 tablet    Refill:  1    Total time spent: 20 Minutes  This patient was seen today by Theodoro Grist, AGNP-C in collaboration with Dr. Lavera Guise as part of a collaborative care agreement.  Time spent includes review of chart, medications, test results, and follow up plan with the patient.   Theodoro Grist, AGNP-C  Dr Lavera Guise Internal medicine

## 2020-05-02 ENCOUNTER — Telehealth: Payer: Self-pay

## 2020-05-02 NOTE — Telephone Encounter (Signed)
Confirmed and screened for 05-04-20 ov.

## 2020-05-02 NOTE — Telephone Encounter (Signed)
Lmom to confirm and screen for 05-04-20 ov.

## 2020-05-04 ENCOUNTER — Encounter: Payer: Self-pay | Admitting: Nurse Practitioner

## 2020-05-04 ENCOUNTER — Other Ambulatory Visit: Payer: Self-pay

## 2020-05-04 ENCOUNTER — Ambulatory Visit: Payer: Managed Care, Other (non HMO) | Admitting: Nurse Practitioner

## 2020-05-04 VITALS — BP 156/96 | HR 63 | Temp 97.5°F | Resp 16 | Ht 67.0 in | Wt 250.6 lb

## 2020-05-04 DIAGNOSIS — I10 Essential (primary) hypertension: Secondary | ICD-10-CM

## 2020-05-04 DIAGNOSIS — E559 Vitamin D deficiency, unspecified: Secondary | ICD-10-CM | POA: Diagnosis not present

## 2020-05-04 DIAGNOSIS — E876 Hypokalemia: Secondary | ICD-10-CM | POA: Diagnosis not present

## 2020-05-04 MED ORDER — AMLODIPINE BESYLATE 2.5 MG PO TABS: 5.0000 mg | ORAL_TABLET | Freq: Every day | ORAL | 2 refills | Status: DC

## 2020-05-04 MED ORDER — POTASSIUM CHLORIDE CRYS ER 20 MEQ PO TBCR: 20.0000 meq | EXTENDED_RELEASE_TABLET | Freq: Two times a day (BID) | ORAL | 5 refills | Status: DC

## 2020-05-04 MED ORDER — CALCITRIOL 0.25 MCG PO CAPS: 0.2500 ug | ORAL_CAPSULE | Freq: Every day | ORAL | 5 refills | Status: DC

## 2020-05-04 NOTE — Progress Notes (Signed)
[facility] [place], [place] [address]  Internal MEDICINE  Office Visit Note  Patient Name: Jill Banks  [id number]  [id number]  Date of Service: 05/21/2020  Chief Complaint  Patient presents with  . Follow-up    blood pressure  . Hypertension  . Quality Metric Gaps    TDAP, HepC, HIV screening    The patient is here for follow up visit. Blood pressure had been elevated at her most recent visit. Amlodipine 2.5mg  was added to her medications.  Her blood pressure is mildly improved, but still elevated. She denies chest pain, chest pressure, or shortness of breath. She does have some right ankle edema which has been present for some time. This has not changed. She reports no negative side effects associated with taking this new medication.       Current Medication: Outpatient Encounter Medications as of 05/04/2020  Medication Sig  . amLODipine (NORVASC) 2.5 MG tablet Take 2 tablets (5 mg total) by mouth daily.  . bisoprolol (ZEBETA) 5 MG tablet Take 1 tablet (5 mg total) by mouth daily.  . calcitRIOL (ROCALTROL) 0.25 MCG capsule Take 1 capsule (0.25 mcg total) by mouth daily.  . diclofenac sodium (VOLTAREN) 1 % GEL Apply 2 g topically 4 (four) times daily as needed (knee pain).  . hydrochlorothiazide (HYDRODIURIL) 25 MG tablet Take 1 tablet (25 mg total) by mouth daily.  . NON FORMULARY cpap device  . potassium chloride SA (KLOR-CON M20) 20 MEQ tablet Take 1 tablet (20 mEq total) by mouth 2 (two) times daily.  . [DISCONTINUED] ALPRAZolam (XANAX) 0.25 MG tablet Take 1 tablet (0.25 mg total) by mouth at bedtime as needed for anxiety.  . [DISCONTINUED] amLODipine (NORVASC) 2.5 MG tablet Take 1 tablet (2.5 mg total) by mouth daily.  . [DISCONTINUED] calcitRIOL (ROCALTROL) 0.25 MCG capsule Take 1 capsule (0.25 mcg total) by mouth daily.  . [DISCONTINUED] potassium chloride SA (KLOR-CON M20) 20 MEQ tablet Take 1 tablet (20 mEq total) by mouth 2 (two) times  daily.  Marland Kitchen enoxaparin (LOVENOX) 40 MG/0.4ML injection Inject 0.4 mLs (40 mg total) into the skin daily for 14 days.  . [DISCONTINUED] docusate sodium (COLACE) 100 MG capsule Take 1 capsule (100 mg total) by mouth 2 (two) times daily. (Patient not taking: Reported on 05/04/2020)  . [DISCONTINUED] irbesartan (AVAPRO) 300 MG tablet Take 1 tablet (300 mg total) by mouth daily. (Patient not taking: Reported on 05/04/2020)  . [DISCONTINUED] oxyCODONE (OXY IR/ROXICODONE) 5 MG immediate release tablet Take 1-2 tablets (5-10 mg total) by mouth every 4 (four) hours as needed for moderate pain (pain score 4-6). (Patient not taking: Reported on 05/04/2020)  . [DISCONTINUED] traMADol (ULTRAM) 50 MG tablet Take 1 tablet (50 mg total) by mouth every 6 (six) hours. (Patient not taking: Reported on 05/04/2020)   No facility-administered encounter medications on file as of 05/04/2020.    Surgical History: Past Surgical History:  Procedure Laterality Date  . APPLICATION OF WOUND VAC 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   Procedure: APPLICATION OF WOUND VAC;  Surgeon: Hessie Knows, MD;  Location: [facility] ORS;  Service: Orthopedics;  Laterality: Left;  #[id number]  . DILATION AND CURETTAGE OF UTERUS     x 3  . TOE SURGERY Left    second toe  . 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  Anxiety   . Arthritis   . Bruises easily   . Headache   . Hypertension   . Leg pain   . Pneumonia    about 5 yrs ago  . Sleep apnea     Family History: Family History  Problem Relation Age of Onset  . Hypertension Father   . Stroke Father   . Hypertension Brother   . Stroke Brother   . Hyperlipidemia Brother   . Asthma Son     Social History   Socioeconomic History  . Marital status: Married    Spouse name: Not on file  . Number of children:  Not on file  . Years of education: Not on file  . Highest education level: Not on file  Occupational History  . Not on file  Tobacco Use  . Smoking status: Former Research scientist (life sciences)  . Smokeless tobacco: Never Used  Vaping Use  . Vaping Use: Never used  Substance and Sexual Activity  . Alcohol use: Yes    Comment: ocassionally  . Drug use: No  . Sexual activity: Yes  Other Topics Concern  . Not on file  Social History Narrative  . Not on file   Social Determinants of Health   Financial Resource Strain:   . Difficulty of Paying Living Expenses: Not on file  Food Insecurity:   . Worried About Charity fundraiser in the Last Year: Not on file  . Ran Out of Food in the Last Year: Not on file  Transportation Needs:   . Lack of Transportation (Medical): Not on file  . Lack of Transportation (Non-Medical): Not on file  Physical Activity:   . Days of Exercise per Week: Not on file  . Minutes of Exercise per Session: Not on file  Stress:   . Feeling of Stress : Not on file  Social Connections:   . Frequency of Communication with Friends and Family: Not on file  . Frequency of Social Gatherings with Friends and Family: Not on file  . Attends Religious Services: Not on file  . Active Member of Clubs or Organizations: Not on file  . Attends Archivist Meetings: Not on file  . Marital Status: Not on file  Intimate Partner Violence:   . Fear of Current or Ex-Partner: Not on file  . Emotionally Abused: Not on file  . Physically Abused: Not on file  . Sexually Abused: Not on file      Review of Systems  Constitutional: Positive for fatigue. Negative for activity change, appetite change and unexpected weight change.  HENT: Negative for congestion, postnasal drip, rhinorrhea and sinus pressure.   Respiratory: Negative for shortness of breath and wheezing.   Cardiovascular: Negative for chest pain and palpitations.       Mildly improved blood pressure since her most recent visit.    Gastrointestinal: Negative for constipation, diarrhea, nausea and vomiting.  Endocrine: Negative for cold intolerance, heat intolerance, polydipsia and polyuria.  Musculoskeletal: Negative for arthralgias and myalgias.  Skin: Negative for rash.  Allergic/Immunologic: Negative for environmental allergies.  Neurological: Positive for headaches. Negative for dizziness.  Hematological: Negative for adenopathy. Does not bruise/bleed easily.  Psychiatric/Behavioral: Positive for sleep disturbance. Negative for agitation and decreased concentration. The patient is nervous/anxious.     Today's Vitals   05/04/20 1545  BP: (!) 156/96  Pulse: 63  Resp: 16  Temp: (!) 97.5 F (36.4 C)  SpO2: 100%  Weight: 250 lb 9.6 oz (113.7 kg)  Height: 5\' 7"  (  1.702 m)   Body mass index is 39.25 kg/m.  Physical Exam Vitals and nursing note reviewed.  Constitutional:      General: She is not in acute distress.    Appearance: Normal appearance. She is well-developed. She is obese. She is not diaphoretic.  HENT:     Head: Normocephalic and atraumatic.     Nose: Nose normal.     Mouth/Throat:     Pharynx: No oropharyngeal exudate.  Eyes:     Extraocular Movements: Extraocular movements intact.     Pupils: Pupils are equal, round, and reactive to light.  Neck:     Thyroid: No thyromegaly.     Vascular: No JVD.     Trachea: No tracheal deviation.  Cardiovascular:     Rate and Rhythm: Normal rate and regular rhythm.     Heart sounds: Normal heart sounds. No murmur heard.  No friction rub. No gallop.   Pulmonary:     Effort: Pulmonary effort is normal. No respiratory distress.     Breath sounds: Normal breath sounds. No wheezing or rales.  Chest:     Chest wall: No tenderness.  Abdominal:     Palpations: Abdomen is soft.  Musculoskeletal:        General: Normal range of motion.     Cervical back: Normal range of motion and neck supple.  Lymphadenopathy:     Cervical: No cervical adenopathy.   Skin:    General: Skin is warm and dry.  Neurological:     Mental Status: She is alert and oriented to person, place, and time.     Cranial Nerves: No cranial nerve deficit.  Psychiatric:        Behavior: Behavior normal.        Thought Content: Thought content normal.        Judgment: Judgment normal.   Assessment/Plan: 1. Essential hypertension Increase amlodipine to 5mg  daily. Continue other blood pressure medications as prescribed. Limit salt and increase water intake in the diet. Monitor blood pressure closely.  - amLODipine (NORVASC) 2.5 MG tablet; Take 2 tablets (5 mg total) by mouth daily.  Dispense: 60 tablet; Refill: 2  2. Hypokalemia Continue potassium supplement as previously prescribed  - potassium chloride SA (KLOR-CON M20) 20 MEQ tablet; Take 1 tablet (20 mEq total) by mouth 2 (two) times daily.  Dispense: 60 tablet; Refill: 5  3. Vitamin D deficiency, unspecified Continue calcitriol 0.48mcg daily.  - calcitRIOL (ROCALTROL) 0.25 MCG capsule; Take 1 capsule (0.25 mcg total) by mouth daily.  Dispense: 90 capsule; Refill: 5  General Counseling: Nemesis verbalizes understanding of the findings of todays visit and agrees with plan of treatment. I have discussed any further diagnostic evaluation that may be needed or ordered today. We also reviewed her medications today. she has been encouraged to call the office with any questions or concerns that should arise related to todays visit.  Hypertension Counseling:   The following hypertensive lifestyle modification were recommended and discussed:  1. Limiting alcohol intake to less than 1 oz/day of ethanol:(24 oz of beer or 8 oz of wine or 2 oz of 100-proof whiskey). 2. Take baby ASA 81 mg daily. 3. Importance of regular aerobic exercise and losing weight. 4. Reduce dietary saturated fat and cholesterol intake for overall cardiovascular health. 5. Maintaining adequate dietary potassium, calcium, and magnesium intake. 6. Regular  monitoring of the blood pressure. 7. Reduce sodium intake to less than 100 mmol/day (less than 2.3 gm of sodium or less than  6 gm of sodium choride)   This patient was seen by [place] with Dr Lavera Guise as a part of collaborative care agreement   Meds ordered this encounter  Medications  . amLODipine (NORVASC) 2.5 MG tablet    Sig: Take 2 tablets (5 mg total) by mouth daily.    Dispense:  60 tablet    Refill:  2    Increased to two tablets daily    Order Specific Question:   Supervising Provider    Answer:   Lavera Guise [place]  . potassium chloride SA (KLOR-CON M20) 20 MEQ tablet    Sig: Take 1 tablet (20 mEq total) by mouth 2 (two) times daily.    Dispense:  60 tablet    Refill:  5    Order Specific Question:   Supervising Provider    Answer:   Lavera Guise [[id number]]  . calcitRIOL (ROCALTROL) 0.25 MCG capsule    Sig: Take 1 capsule (0.25 mcg total) by mouth daily.    Dispense:  90 capsule    Refill:  5    Order Specific Question:   Supervising Provider    Answer:   Lavera Guise [[id number]]    Total time spent: 25 Minutes   Time spent includes review of chart, medications, test results, and follow up plan with the patient.      Dr Lavera Guise Internal medicine

## 2020-05-18 ENCOUNTER — Telehealth: Payer: Self-pay

## 2020-05-18 NOTE — Telephone Encounter (Signed)
Confirmed & screened OV on 9/7

## 2020-05-23 ENCOUNTER — Encounter: Payer: Self-pay | Admitting: Hospice and Palliative Medicine

## 2020-05-23 ENCOUNTER — Ambulatory Visit: Payer: Managed Care, Other (non HMO) | Admitting: Hospice and Palliative Medicine

## 2020-05-23 ENCOUNTER — Other Ambulatory Visit: Payer: Self-pay

## 2020-05-23 DIAGNOSIS — I10 Essential (primary) hypertension: Secondary | ICD-10-CM

## 2020-05-23 DIAGNOSIS — Z6839 Body mass index (BMI) 39.0-39.9, adult: Secondary | ICD-10-CM | POA: Diagnosis not present

## 2020-05-23 DIAGNOSIS — Z0001 Encounter for general adult medical examination with abnormal findings: Secondary | ICD-10-CM

## 2020-05-23 DIAGNOSIS — G4733 Obstructive sleep apnea (adult) (pediatric): Secondary | ICD-10-CM | POA: Diagnosis not present

## 2020-05-23 DIAGNOSIS — Z9989 Dependence on other enabling machines and devices: Secondary | ICD-10-CM

## 2020-05-23 NOTE — Progress Notes (Signed)
[facility] [place], [place] [address]  Internal MEDICINE  Office Visit Note  Patient Name: Jill Banks  [id number]  [id number]  Date of Service: 05/24/2020  Chief Complaint  Patient presents with  . Follow-up  . Hypertension  . Quality Metric Gaps    HepC, TDAP, HIV screening    HPI Patient is here for routine follow-up BP-she purchased a bp machine and has been monitoring it at home. Since increasing her amlodipine she has noticed her bp has averaging 130-140/70-80. She did not bring her log with her today. She does say since increasing her amlodipine she has started feeling better. She is no longer having headaches or visual disturbances. She is wearing her CPAP every night but wonders if it is still working. She has not been seen for a download in quite a while. She says she is tired during the day most days and has little energy.    Current Medication: Outpatient Encounter Medications as of 05/23/2020  Medication Sig  . amLODipine (NORVASC) 2.5 MG tablet Take 2 tablets (5 mg total) by mouth daily.  . bisoprolol (ZEBETA) 5 MG tablet Take 1 tablet (5 mg total) by mouth daily.  . calcitRIOL (ROCALTROL) 0.25 MCG capsule Take 1 capsule (0.25 mcg total) by mouth daily.  . diclofenac sodium (VOLTAREN) 1 % GEL Apply 2 g topically 4 (four) times daily as needed (knee pain).  . hydrochlorothiazide (HYDRODIURIL) 25 MG tablet Take 1 tablet (25 mg total) by mouth daily.  . NON FORMULARY cpap device  . potassium chloride SA (KLOR-CON M20) 20 MEQ tablet Take 1 tablet (20 mEq total) by mouth 2 (two) times daily.  Marland Kitchen enoxaparin (LOVENOX) 40 MG/0.4ML injection Inject 0.4 mLs (40 mg total) into the skin daily for 14 days.   No facility-administered encounter medications on file as of 05/23/2020.    Surgical History: Past Surgical History:  Procedure Laterality Date  . APPLICATION OF WOUND VAC 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   Procedure: APPLICATION OF WOUND VAC;  Surgeon: Hessie Knows, MD;  Location: [facility] ORS;  Service: Orthopedics;  Laterality: Left;  #[id number]  . DILATION AND CURETTAGE OF UTERUS     x 3  . TOE SURGERY Left    second toe  . 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 Anxiety   . Arthritis   . Bruises easily   . Headache   . Hypertension   . Leg pain   . Pneumonia    about 5 yrs ago  . Sleep apnea     Family History: Family History  Problem Relation Age of Onset  . Hypertension Father   . Stroke Father   . Hypertension Brother   . Stroke Brother   . Hyperlipidemia Brother   . Asthma Son     Social History   Socioeconomic History  . Marital status: Married    Spouse name: Not on file  . Number of children: Not on file  . Years of education: Not on file  . Highest education level: Not on file  Occupational History  . Not on file  Tobacco Use  . Smoking status: Former Research scientist (life sciences)  . Smokeless tobacco: Never Used  Vaping Use  . Vaping Use: Never used  Substance and Sexual Activity  . Alcohol use: Yes  Comment: ocassionally  . Drug use: No  . Sexual activity: Yes  Other Topics Concern  . Not on file  Social History Narrative  . Not on file   Social Determinants of Health   Financial Resource Strain:   . Difficulty of Paying Living Expenses: Not on file  Food Insecurity:   . Worried About Charity fundraiser in the Last Year: Not on file  . Ran Out of Food in the Last Year: Not on file  Transportation Needs:   . Lack of Transportation (Medical): Not on file  . Lack of Transportation (Non-Medical): Not on file  Physical Activity:   . Days of Exercise per Week: Not on file  . Minutes of Exercise per Session: Not on file  Stress:   . Feeling of Stress : Not on file  Social Connections:   . Frequency of Communication with  Friends and Family: Not on file  . Frequency of Social Gatherings with Friends and Family: Not on file  . Attends Religious Services: Not on file  . Active Member of Clubs or Organizations: Not on file  . Attends Archivist Meetings: Not on file  . Marital Status: Not on file  Intimate Partner Violence:   . Fear of Current or Ex-Partner: Not on file  . Emotionally Abused: Not on file  . Physically Abused: Not on file  . Sexually Abused: Not on file   Review of Systems  Constitutional: Negative for chills, diaphoresis and fatigue.  HENT: Negative for ear pain, postnasal drip and sinus pressure.   Eyes: Negative for photophobia, discharge, redness, itching and visual disturbance.  Respiratory: Negative for cough, shortness of breath and wheezing.   Cardiovascular: Negative for chest pain, palpitations and leg swelling.  Gastrointestinal: Negative for abdominal pain, constipation, diarrhea, nausea and vomiting.  Genitourinary: Negative for dysuria and flank pain.  Musculoskeletal: Negative for arthralgias, back pain, gait problem and neck pain.  Skin: Negative for color change.  Allergic/Immunologic: Negative for environmental allergies and food allergies.  Neurological: Negative for dizziness and headaches.  Hematological: Does not bruise/bleed easily.  Psychiatric/Behavioral: Negative for agitation, behavioral problems (depression) and hallucinations.    Vital Signs: BP (!) 142/78   Pulse 66   Temp 98.6 F (37 C)   Resp 16   Ht 5\' 7"  (1.702 m)   Wt 251 lb 3.2 oz (113.9 kg)   SpO2 96%   BMI 39.34 kg/m    Physical Exam Vitals reviewed.  Constitutional:      Appearance: She is obese.  HENT:     Mouth/Throat:     Mouth: Mucous membranes are moist.     Pharynx: Oropharynx is clear.  Cardiovascular:     Rate and Rhythm: Normal rate and regular rhythm.     Pulses: Normal pulses.     Heart sounds: Normal heart sounds.  Pulmonary:     Effort: Pulmonary effort is  normal.     Breath sounds: Normal breath sounds.  Abdominal:     General: Abdomen is flat.     Palpations: Abdomen is soft.  Musculoskeletal:        General: Normal range of motion.     Cervical back: Normal range of motion.  Skin:    General: Skin is warm.  Neurological:     General: No focal deficit present.     Mental Status: She is alert and oriented to person, place, and time. Mental status is at baseline.  Psychiatric:  Mood and Affect: Mood normal.        Behavior: Behavior normal.        Thought Content: Thought content normal.     Assessment/Plan: 1. Essential hypertension BP remains elevated today, was improved on manual recheck. She says her BP readings are better when she checks it at home. Encouraged her to continue with home monitoring and to bring her log to next appointment. Will review echo results to assess for underlying cardiac etiology. Will need to further adjust therapy if BP continues to be elevated at next follow-up. - ECHOCARDIOGRAM COMPLETE; Future Hypertension Counseling:   The following hypertensive lifestyle modification were recommended and discussed:  1. Limiting alcohol intake to less than 1 oz/day of ethanol:(24 oz of beer or 8 oz of wine or 2 oz of 100-proof whiskey). 2. Take baby ASA 81 mg daily. 3. Importance of regular aerobic exercise and losing weight. 4. Reduce dietary saturated fat and cholesterol intake for overall cardiovascular health. 5. Maintaining adequate dietary potassium, calcium, and magnesium intake. 6. Regular monitoring of the blood pressure. 7. Reduce sodium intake to less than 100 mmol/day (less than 2.3 gm of sodium or less than 6 gm of sodium choride)   2. OSA on CPAP Continue with current CPAP settings at this time. Will set her up to have a download. Will need to review download and ensure OSA in being well controlled with current settings. May need titration adjustments if AHI elevated, may also be contributing to  her elevated BP and daytime sleepiness.  3. BMI 39.0-39.9,adult Discussed with her the importance of weight management. Discussed the negative effects obesity has on her BP, OSA and overall health. Encouraged to incorporate healthy eating habits and exercise into her daily routine. Obesity Counseling: Risk Assessment: An assessment of behavioral risk factors was made today and includes lack of exercise sedentary lifestyle, lack of portion control and poor dietary habits.  Risk Modification Advice: She was counseled on portion control guidelines. Restricting daily caloric intake. The detrimental long term effects of obesity on her health and ongoing poor compliance was also discussed with the patient.  Labs ordered for upcoming CPE in October, will review them with her at that appointment - CBC w/Diff/Platelet - Lipid Panel With LDL/HDL Ratio - TSH + free T4 - Comprehensive Metabolic Panel (CMET) - Vitamin D 1,25 dihydroxy - B12  General Counseling: Aalaya verbalizes understanding of the findings of todays visit and agrees with plan of treatment. I have discussed any further diagnostic evaluation that may be needed or ordered today. We also reviewed her medications today. she has been encouraged to call the office with any questions or concerns that should arise related to todays visit.    Orders Placed This Encounter  Procedures  . CBC w/Diff/Platelet  . Lipid Panel With LDL/HDL Ratio  . TSH + free T4  . Comprehensive Metabolic Panel (CMET)  . Vitamin D 1,25 dihydroxy  . B12  . ECHOCARDIOGRAM COMPLETE   Time spent: 30 Minutes Time spent includes review of chart, medications, test results and follow-up plan with the patient.  This patient was seen by Theodoro Grist AGNP-C in Collaboration with Dr Lavera Guise as a part of collaborative care agreement     Tanna Furry. Harris AGNP-C Internal medicine

## 2020-05-31 ENCOUNTER — Other Ambulatory Visit: Payer: Self-pay

## 2020-05-31 ENCOUNTER — Ambulatory Visit: Payer: Managed Care, Other (non HMO)

## 2020-07-04 ENCOUNTER — Other Ambulatory Visit: Payer: Self-pay

## 2020-07-04 ENCOUNTER — Ambulatory Visit (INDEPENDENT_AMBULATORY_CARE_PROVIDER_SITE_OTHER): Payer: Managed Care, Other (non HMO) | Admitting: Nurse Practitioner

## 2020-07-04 VITALS — BP 138/82 | HR 75 | Temp 97.5°F | Resp 16 | Ht 67.0 in | Wt 258.8 lb

## 2020-07-04 DIAGNOSIS — M7751 Other enthesopathy of right foot: Secondary | ICD-10-CM

## 2020-07-04 DIAGNOSIS — M25571 Pain in right ankle and joints of right foot: Secondary | ICD-10-CM

## 2020-07-04 DIAGNOSIS — Z9989 Dependence on other enabling machines and devices: Secondary | ICD-10-CM

## 2020-07-04 DIAGNOSIS — M25471 Effusion, right ankle: Secondary | ICD-10-CM

## 2020-07-04 DIAGNOSIS — Z1231 Encounter for screening mammogram for malignant neoplasm of breast: Secondary | ICD-10-CM

## 2020-07-04 DIAGNOSIS — K439 Ventral hernia without obstruction or gangrene: Secondary | ICD-10-CM

## 2020-07-04 DIAGNOSIS — R300 Dysuria: Secondary | ICD-10-CM

## 2020-07-04 DIAGNOSIS — Z0001 Encounter for general adult medical examination with abnormal findings: Secondary | ICD-10-CM | POA: Diagnosis not present

## 2020-07-04 DIAGNOSIS — I10 Essential (primary) hypertension: Secondary | ICD-10-CM

## 2020-07-04 DIAGNOSIS — G8929 Other chronic pain: Secondary | ICD-10-CM

## 2020-07-04 DIAGNOSIS — G4733 Obstructive sleep apnea (adult) (pediatric): Secondary | ICD-10-CM

## 2020-07-04 NOTE — Progress Notes (Signed)
[facility] [place], [place] [address]  Internal MEDICINE  Office Visit Note  Patient Name: Jill Banks  [id number]  [id number]  Date of Service: 07/05/2020   Pt is here for routine health maintenance examination  Chief Complaint  Patient presents with  . Annual Exam  . Hypertension  . controlled substance form    reviewed with PT  . Quality Metric Gaps    tetnaus, Hep C     The patient is here for health maintenance exam. The patient has large abdominal hernia which has bcome larger of the course of time. Also gets larger and much more noticeable with exertion. It does get tender at time. She states that bowel habits are normal. She has not developed nausea, vomiting, or diarrhea.  Has swelling of right ankle. In the past, she had MRI of the right ankle and foot. 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 Surgical repair was recommended in the past, however, she also needed to have surgical repair on her left knee and she opted to do that first. She has not returned to the podiatrist, as she was not happy with the care she received. She is now experiencing more pain in the right knee as well as right ankle. She would like to have consultation with new podiatrist for second opinion.  Today, her blood pressure is well managed. She is using her CPAP every night. She has no complaint of chest pain, chest pressure or shortness of breath. She is due to have routine, fasting labs. She should also be scheduled for screening mammogram.     Current Medication: Outpatient Encounter Medications as of 07/04/2020  Medication Sig  . amLODipine (NORVASC) 2.5 MG tablet Take 2 tablets (5 mg total) by mouth daily.  . bisoprolol (ZEBETA) 5 MG tablet Take 1 tablet (5 mg total) by mouth daily.  . calcitRIOL (ROCALTROL) 0.25 MCG capsule Take 1 capsule (0.25 mcg total) by mouth daily.  . diclofenac sodium (VOLTAREN) 1 %  GEL Apply 2 g topically 4 (four) times daily as needed (knee pain).  . hydrochlorothiazide (HYDRODIURIL) 25 MG tablet Take 1 tablet (25 mg total) by mouth daily.  . NON FORMULARY cpap device  . potassium chloride SA (KLOR-CON M20) 20 MEQ tablet Take 1 tablet (20 mEq total) by mouth 2 (two) times daily.  Marland Kitchen enoxaparin (LOVENOX) 40 MG/0.4ML injection Inject 0.4 mLs (40 mg total) into the skin daily for 14 days.   No facility-administered encounter medications on file as of 07/04/2020.    Surgical History: Past Surgical History:  Procedure Laterality Date  . APPLICATION OF WOUND VAC 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   Procedure: APPLICATION OF WOUND VAC;  Surgeon: Hessie Knows, MD;  Location: [facility] ORS;  Service: Orthopedics;  Laterality: Left;  #[id number]  . DILATION AND CURETTAGE OF UTERUS     x 3  . TOE SURGERY Left    second toe  . 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 Anxiety   . Arthritis   . Bruises easily   . Headache   . Hypertension   . Leg pain   . Pneumonia    about 5 yrs ago  . Sleep apnea     Family History: Family History  Problem  Relation Age of Onset  . Hypertension Father   . Stroke Father   . Hypertension Brother   . Stroke Brother   . Hyperlipidemia Brother   . Asthma Son       Review of Systems  Constitutional: Positive for fatigue. Negative for activity change, chills and unexpected weight change.  HENT: Negative for congestion, postnasal drip, rhinorrhea, sneezing and sore throat.   Respiratory: Positive for apnea. Negative for cough, chest tightness, shortness of breath and wheezing.   Cardiovascular: Negative for chest pain and palpitations.  Gastrointestinal: Negative for abdominal pain, constipation, diarrhea, nausea and vomiting.       Abdominal/ventral hernia.    Endocrine: Negative for cold intolerance, heat intolerance, polydipsia and polyuria.  Genitourinary: Negative for dysuria and frequency.  Musculoskeletal: Positive for arthralgias, joint swelling and myalgias. Negative for back pain and neck pain.       Right knee and right ankle pain with swelling. Having to walk with limp due to increased pain and swelling of the ankle.   Skin: Negative for rash.  Allergic/Immunologic: Negative for environmental allergies.  Neurological: Negative for dizziness, tremors, numbness and headaches.  Hematological: Negative for adenopathy. Does not bruise/bleed easily.  Psychiatric/Behavioral: Negative for behavioral problems (Depression), sleep disturbance and suicidal ideas. The patient is not nervous/anxious.      Today's Vitals   07/04/20 1508  BP: 138/82  Pulse: 75  Resp: 16  Temp: (!) 97.5 F (36.4 C)  SpO2: 98%  Weight: 258 lb 12.8 oz (117.4 kg)  Height: 5\' 7"  (1.702 m)   Body mass index is 40.53 kg/m.  Physical Exam Vitals and nursing note reviewed.  Constitutional:      General: She is not in acute distress.    Appearance: Normal appearance. She is well-developed. She is obese. She is not diaphoretic.  HENT:     Head: Normocephalic and atraumatic.     Nose: Nose normal.     Mouth/Throat:     Pharynx: No oropharyngeal exudate.  Eyes:     Pupils: Pupils are equal, round, and reactive to light.  Neck:     Thyroid: No thyromegaly.     Vascular: No JVD.     Trachea: No tracheal deviation.  Cardiovascular:     Rate and Rhythm: Normal rate and regular rhythm.     Heart sounds: Normal heart sounds. No murmur heard.  No friction rub. No gallop.   Pulmonary:     Effort: Pulmonary effort is normal. No respiratory distress.     Breath sounds: No wheezing or rales.  Chest:     Chest wall: No tenderness.     Breasts:        Right: Normal. No swelling, bleeding, inverted nipple, mass, nipple discharge, skin change or tenderness.         Left: Normal. No swelling, bleeding, inverted nipple, mass, nipple discharge, skin change or tenderness.  Abdominal:     General: Bowel sounds are normal.     Palpations: Abdomen is soft.     Tenderness: There is abdominal tenderness.     Hernia: A hernia is present.     Comments: Large ventral hernia, more severe with exertion. Mildly tender with palpation.   Musculoskeletal:        General: Normal range of motion.     Cervical back: Normal range of motion and neck supple.       Legs:  Lymphadenopathy:     Cervical: No cervical adenopathy.     Upper  Body:     Right upper body: No axillary adenopathy.     Left upper body: No axillary adenopathy.  Skin:    General: Skin is warm and dry.  Neurological:     General: No focal deficit present.     Mental Status: She is alert and oriented to person, place, and time.     Cranial Nerves: No cranial nerve deficit.  Psychiatric:        Mood and Affect: Mood normal.        Behavior: Behavior normal.        Thought Content: Thought content normal.        Judgment: Judgment normal.      LABS: Recent Results (from the past 2160 hour(s))  UA/M w/rflx Culture, Routine     Status: None (Preliminary result)   Collection Time: 07/04/20  3:55 PM   Specimen: Urine   Urine  Result Value Ref Range   Specific Gravity, UA 1.017 1.005 - 1.030   pH, UA 6.5 5.0 - 7.5   Color, UA Yellow Yellow   Appearance Ur Clear Clear   Leukocytes,UA Negative Negative   Protein,UA Negative Negative/Trace   Glucose, UA Negative Negative   Ketones, UA Negative Negative   RBC, UA Negative Negative   Bilirubin, UA Negative Negative   Urobilinogen, Ur 0.2 0.2 - 1.0 mg/dL   Nitrite, UA Negative Negative   Microscopic Examination Comment     Comment: Microscopic follows if indicated.   Microscopic Examination See below:     Comment: Microscopic was indicated and was performed.   Urinalysis Reflex WILL FOLLOW   Microscopic Examination     Status: None  (Preliminary result)   Collection Time: 07/04/20  3:55 PM   Urine  Result Value Ref Range   WBC, UA WILL FOLLOW    RBC WILL FOLLOW    Epithelial Cells (non renal) WILL FOLLOW    Renal Epithel, UA WILL FOLLOW    Casts WILL FOLLOW    Cast Type WILL FOLLOW    Crystals WILL FOLLOW    Crystal Type WILL FOLLOW    Mucus, UA WILL FOLLOW    Bacteria, UA WILL FOLLOW    Yeast, UA WILL FOLLOW    Trichomonas, UA WILL FOLLOW    Urinalysis Comments WILL FOLLOW     Assessment/Plan: 1. Encounter for health maintenance examination with abnormal findings Annual health maintenance exam today. Order slip given to have routine, fasting labs drawn.   2. Essential hypertension bp stable. Continue meds as prescribed   3. Ventral hernia without obstruction or gangrene Will get CT of the abdomen for further evaluation. Refer to gI/surgery as indicated.  - CT ABDOMEN W CONTRAST; Future  4. Pain and swelling of ankle, right Reviewed results of prior MRI and x-ray of right ankle and foot with patient. There is indication of large bone spur formation as well as development of lipoma at the distal lateral aspect of the right ankle. Refer to podiatry for further evaluation and treatment.  - Ambulatory referral to Podiatry  5. Chronic pain of right ankle Reviewed results of prior MRI and x-ray of right ankle and foot with patient. There is indication of large bone spur formation as well as development of lipoma at the distal lateral aspect of the right ankle. Refer to podiatry for further evaluation and treatment.   6. Bone spur of right ankle Reviewed results of prior MRI and x-ray of right ankle and foot with patient. There is indication  of large bone spur formation as well as development of lipoma at the distal lateral aspect of the right ankle. Refer to podiatry for further evaluation and treatment.  - Ambulatory referral to Podiatry  7. Encounter for screening mammogram for malignant neoplasm of  breast - MM DIGITAL SCREENING BILATERAL; Future  8. OSA on CPAP Continue regular visits with Dr. Devona Konig for CPAP management.   9. Dysuria - UA/M w/rflx Culture, Routine  General Counseling: Lucky verbalizes understanding of the findings of todays visit and agrees with plan of treatment. I have discussed any further diagnostic evaluation that may be needed or ordered today. We also reviewed her medications today. she has been encouraged to call the office with any questions or concerns that should arise related to todays visit.    Counseling:  This patient was seen by Leretha Pol FNP Collaboration with Dr Lavera Guise as a part of collaborative care agreement  Orders Placed This Encounter  Procedures  . Microscopic Examination  . MM DIGITAL SCREENING BILATERAL  . CT ABDOMEN W CONTRAST  . UA/M w/rflx Culture, Routine  . Ambulatory referral to Podiatry      Total time spent: 45 Minutes  Time spent includes review of chart, medications, test results, and follow up plan with the patient.     Lavera Guise, MD  Internal Medicine

## 2020-07-05 ENCOUNTER — Encounter: Payer: Self-pay | Admitting: Nurse Practitioner

## 2020-07-05 DIAGNOSIS — Z0001 Encounter for general adult medical examination with abnormal findings: Secondary | ICD-10-CM | POA: Insufficient documentation

## 2020-07-05 DIAGNOSIS — M7751 Other enthesopathy of right foot: Secondary | ICD-10-CM | POA: Insufficient documentation

## 2020-07-05 LAB — UA/M W/RFLX CULTURE, ROUTINE
Bilirubin, UA: NEGATIVE
Glucose, UA: NEGATIVE
Ketones, UA: NEGATIVE
Leukocytes,UA: NEGATIVE
Nitrite, UA: NEGATIVE
Protein,UA: NEGATIVE
RBC, UA: NEGATIVE
Specific Gravity, UA: 1.017 (ref 1.005–1.030)
Urobilinogen, Ur: 0.2 mg/dL (ref 0.2–1.0)
pH, UA: 6.5 (ref 5.0–7.5)

## 2020-07-05 LAB — MICROSCOPIC EXAMINATION
Bacteria, UA: NONE SEEN
Casts: NONE SEEN /lpf
Epithelial Cells (non renal): NONE SEEN /hpf (ref 0–10)
RBC, Urine: NONE SEEN /hpf (ref 0–2)

## 2020-07-06 ENCOUNTER — Other Ambulatory Visit: Payer: Self-pay

## 2020-07-06 ENCOUNTER — Telehealth: Payer: Self-pay

## 2020-07-06 DIAGNOSIS — I10 Essential (primary) hypertension: Secondary | ICD-10-CM

## 2020-07-06 MED ORDER — HYDROCHLOROTHIAZIDE 25 MG PO TABS: 25.0000 mg | ORAL_TABLET | Freq: Every day | ORAL | 5 refills | Status: DC

## 2020-07-06 NOTE — Telephone Encounter (Signed)
-----   Message from Lavera Guise, MD sent at 07/06/2020  9:40 AM EDT ----- Urine reviewed, looks normal

## 2020-07-06 NOTE — Progress Notes (Signed)
Urine reviewed, looks normal

## 2020-07-06 NOTE — Telephone Encounter (Signed)
Spoke with pt, she is aware of urine culture results.

## 2020-07-07 ENCOUNTER — Ambulatory Visit: Payer: Managed Care, Other (non HMO)

## 2020-07-07 ENCOUNTER — Other Ambulatory Visit: Payer: Self-pay

## 2020-07-07 DIAGNOSIS — R0602 Shortness of breath: Secondary | ICD-10-CM | POA: Diagnosis not present

## 2020-07-07 DIAGNOSIS — I10 Essential (primary) hypertension: Secondary | ICD-10-CM

## 2020-07-10 ENCOUNTER — Other Ambulatory Visit: Payer: Self-pay

## 2020-07-10 ENCOUNTER — Telehealth: Payer: Self-pay

## 2020-07-10 DIAGNOSIS — I10 Essential (primary) hypertension: Secondary | ICD-10-CM

## 2020-07-10 MED ORDER — AMLODIPINE BESYLATE 2.5 MG PO TABS: 5.0000 mg | ORAL_TABLET | Freq: Every day | ORAL | 2 refills | Status: DC

## 2020-07-10 NOTE — Telephone Encounter (Signed)
Left a message and advised pt of ct appt. Jill Banks

## 2020-07-18 ENCOUNTER — Ambulatory Visit
Admission: RE | Admit: 2020-07-18 | Discharge: 2020-07-18 | Disposition: A | Discharge status: Home / Self Care | Payer: Managed Care, Other (non HMO) | Source: Ambulatory Visit | Attending: Nurse Practitioner | Admitting: Nurse Practitioner

## 2020-07-18 ENCOUNTER — Other Ambulatory Visit: Payer: Self-pay

## 2020-07-18 DIAGNOSIS — K439 Ventral hernia without obstruction or gangrene: Secondary | ICD-10-CM | POA: Diagnosis not present

## 2020-07-18 LAB — POCT I-STAT CREATININE: Creatinine, Ser: 0.7 mg/dL (ref 0.44–1.00)

## 2020-07-18 MED ORDER — IOHEXOL 300 MG/ML  SOLN
100.0000 mL | Freq: Once | INTRAMUSCULAR | Status: AC | PRN
  Administered 2020-07-18: 100 mL via INTRAVENOUS

## 2020-07-18 NOTE — Progress Notes (Signed)
What do you think about this?

## 2020-07-21 ENCOUNTER — Other Ambulatory Visit: Payer: Self-pay | Admitting: Nurse Practitioner

## 2020-07-22 LAB — COMPREHENSIVE METABOLIC PANEL
ALT: 15 IU/L (ref 0–32)
AST: 18 IU/L (ref 0–40)
Albumin/Globulin Ratio: 1.6 (ref 1.2–2.2)
Albumin: 4.3 g/dL (ref 3.8–4.8)
Alkaline Phosphatase: 82 IU/L (ref 44–121)
BUN/Creatinine Ratio: 21 (ref 12–28)
BUN: 14 mg/dL (ref 8–27)
Bilirubin Total: 0.4 mg/dL (ref 0.0–1.2)
CO2: 24 mmol/L (ref 20–29)
Calcium: 9.8 mg/dL (ref 8.7–10.3)
Chloride: 99 mmol/L (ref 96–106)
Creatinine, Ser: 0.66 mg/dL (ref 0.57–1.00)
GFR calc Af Amer: 108 mL/min/{1.73_m2} (ref >59)
GFR calc non Af Amer: 94 mL/min/{1.73_m2} (ref >59)
Globulin, Total: 2.7 g/dL (ref 1.5–4.5)
Glucose: 100 mg/dL — ABNORMAL HIGH (ref 65–99)
Potassium: 4.1 mmol/L (ref 3.5–5.2)
Sodium: 140 mmol/L (ref 134–144)
Total Protein: 7 g/dL (ref 6.0–8.5)

## 2020-07-22 LAB — CBC
Hematocrit: 41.4 % (ref 34.0–46.6)
Hemoglobin: 13.8 g/dL (ref 11.1–15.9)
MCH: 28.2 pg (ref 26.6–33.0)
MCHC: 33.3 g/dL (ref 31.5–35.7)
MCV: 85 fL (ref 79–97)
Platelets: 137 10*3/uL — ABNORMAL LOW (ref 150–450)
RBC: 4.9 x10E6/uL (ref 3.77–5.28)
RDW: 14.9 % (ref 11.7–15.4)
WBC: 7.7 10*3/uL (ref 3.4–10.8)

## 2020-07-22 LAB — HCV AB W REFLEX TO QUANT PCR: HCV Ab: <0.1 s/co ratio (ref 0.0–0.9)

## 2020-07-22 LAB — B12 AND FOLATE PANEL
Folate: >20 ng/mL (ref >3.0)
Vitamin B-12: 596 pg/mL (ref 232–1245)

## 2020-07-22 LAB — LIPID PANEL WITH LDL/HDL RATIO
Cholesterol, Total: 200 mg/dL — ABNORMAL HIGH (ref 100–199)
HDL: 56 mg/dL (ref >39)
LDL Chol Calc (NIH): 121 mg/dL — ABNORMAL HIGH (ref 0–99)
LDL/HDL Ratio: 2.2 ratio (ref 0.0–3.2)
Triglycerides: 131 mg/dL (ref 0–149)
VLDL Cholesterol Cal: 23 mg/dL (ref 5–40)

## 2020-07-22 LAB — T4, FREE: Free T4: 1.04 ng/dL (ref 0.82–1.77)

## 2020-07-22 LAB — VITAMIN D 25 HYDROXY (VIT D DEFICIENCY, FRACTURES): Vit D, 25-Hydroxy: 34.6 ng/mL (ref 30.0–100.0)

## 2020-07-22 LAB — TSH: TSH: 4.21 u[IU]/mL (ref 0.450–4.500)

## 2020-07-22 LAB — HCV INTERPRETATION

## 2020-07-26 NOTE — Progress Notes (Signed)
Labs good. Discuss at visit 11/11

## 2020-07-26 NOTE — Progress Notes (Signed)
Will refer to surgery for abdominal wall laxity. GYN for uerine fibroid with enlarging uterus.

## 2020-07-27 ENCOUNTER — Encounter: Payer: Self-pay | Admitting: Nurse Practitioner

## 2020-07-27 ENCOUNTER — Ambulatory Visit: Payer: Managed Care, Other (non HMO) | Admitting: Nurse Practitioner

## 2020-07-27 ENCOUNTER — Other Ambulatory Visit: Payer: Self-pay

## 2020-07-27 VITALS — BP 157/88 | HR 86 | Temp 97.9°F | Resp 16 | Ht 67.0 in | Wt 262.2 lb

## 2020-07-27 DIAGNOSIS — D259 Leiomyoma of uterus, unspecified: Secondary | ICD-10-CM

## 2020-07-27 DIAGNOSIS — K439 Ventral hernia without obstruction or gangrene: Secondary | ICD-10-CM | POA: Diagnosis not present

## 2020-07-27 DIAGNOSIS — I10 Essential (primary) hypertension: Secondary | ICD-10-CM

## 2020-07-27 DIAGNOSIS — I700 Atherosclerosis of aorta: Secondary | ICD-10-CM

## 2020-07-27 MED ORDER — ROSUVASTATIN CALCIUM 5 MG PO TABS: 5.0000 mg | ORAL_TABLET | Freq: Every day | ORAL | 1 refills | Status: DC

## 2020-07-27 NOTE — Progress Notes (Signed)
[facility] [place], [place] [address]  Internal MEDICINE  Office Visit Note  Patient Name: Jill Banks  [id number]  [id number]  Date of Service: 08/26/2020  Chief Complaint  Patient presents with  . Follow-up  . Hypertension  . Quality Metric Gaps  . controlled substance form    Reviewed with PT    The patient is here for follow up visit. Had concern of  large abdominal hernia which has bcome larger of the course of time. Also gets larger and much more noticeable with exertion. It does get tender at time. She states that bowel habits are normal. She has not developed nausea, vomiting, or diarrhea. She had CT of the abdomen and pelvis for further evaluation. The CT showed  1. Ventral abdominopelvic wall laxity, without well-defined hernia. 2. Soft tissue fullness within the upper pelvis, most likely due to progression of fibroid uterus when compared to 04/13/2007 pelvic CT. Pelvis not imaged today. Consider further evaluation with pelvic ultrasound. 3. Minimal left-sided caliectasis and proximal hydroureter. Favored to be chronic and possibly related to a crossing vessel. Less likely, this could be secondary to mass effect from presumed uterine enlargement. 4.  Aortic Atherosclerosis (ICD10-I70.0). 5. Nonspecific splenic lesion, most likely a hemangioma or hamartoma.  . Will get further evaluation of fibroid uterus with transvaginal ultrasound.  -blood pressure is mildly elevated today.  -patient has no new concerns or complints.       Current Medication: Outpatient Encounter Medications as of 07/27/2020  Medication Sig  . calcitRIOL (ROCALTROL) 0.25 MCG capsule Take 1 capsule (0.25 mcg total) by mouth daily.  . diclofenac sodium (VOLTAREN) 1 % GEL Apply 2 g topically 4 (four) times daily as needed (knee pain).  . hydrochlorothiazide (HYDRODIURIL) 25 MG tablet Take 1 tablet (25 mg total) by mouth daily.  . NON FORMULARY cpap device  .  potassium chloride SA (KLOR-CON M20) 20 MEQ tablet Take 1 tablet (20 mEq total) by mouth 2 (two) times daily.  . [DISCONTINUED] amLODipine (NORVASC) 2.5 MG tablet Take 2 tablets (5 mg total) by mouth daily.  . [DISCONTINUED] bisoprolol (ZEBETA) 5 MG tablet Take 1 tablet (5 mg total) by mouth daily.  . rosuvastatin (CRESTOR) 5 MG tablet Take 1 tablet (5 mg total) by mouth daily.  . [DISCONTINUED] enoxaparin (LOVENOX) 40 MG/0.4ML injection Inject 0.4 mLs (40 mg total) into the skin daily for 14 days.   No facility-administered encounter medications on file as of 07/27/2020.    Surgical History: Past Surgical History:  Procedure Laterality Date  . APPLICATION OF WOUND VAC 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   Procedure: APPLICATION OF WOUND VAC;  Surgeon: Hessie Knows, MD;  Location: [facility] ORS;  Service: Orthopedics;  Laterality: Left;  #[id number]  . DILATION AND CURETTAGE OF UTERUS     x 3  . TOE SURGERY Left    second toe  . 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 Anxiety   . Arthritis   . Bruises easily   . Headache   . Hypertension   . Leg pain   . Pneumonia    about 5 yrs ago  . Sleep apnea     Family History: Family History  Problem Relation Age of Onset  . Hypertension Father   . Stroke Father   . Hypertension  Brother   . Stroke Brother   . Hyperlipidemia Brother   . Asthma Son     Social History   Socioeconomic History  . Marital status: Married    Spouse name: Not on file  . Number of children: Not on file  . Years of education: Not on file  . Highest education level: Not on file  Occupational History  . Not on file  Tobacco Use  . Smoking status: Former Research scientist (life sciences)  . Smokeless tobacco: Never Used  Vaping Use  . Vaping Use: Never used  Substance and Sexual Activity  . Alcohol  use: Yes    Comment: ocassionally  . Drug use: No  . Sexual activity: Yes  Other Topics Concern  . Not on file  Social History Narrative  . Not on file   Social Determinants of Health   Financial Resource Strain: Not on file  Food Insecurity: Not on file  Transportation Needs: Not on file  Physical Activity: Not on file  Stress: Not on file  Social Connections: Not on file  Intimate Partner Violence: Not on file      Review of Systems  Constitutional: Positive for fatigue. Negative for activity change, chills and unexpected weight change.  HENT: Negative for congestion, postnasal drip, rhinorrhea, sneezing and sore throat.   Respiratory: Negative for apnea, cough, chest tightness, shortness of breath and wheezing.   Cardiovascular: Negative for chest pain and palpitations.       Blood pressure is mildly elevated today.   Gastrointestinal: Negative for abdominal pain, constipation, diarrhea, nausea and vomiting.       Abdominal/ventral hernia.   Endocrine: Negative for cold intolerance, heat intolerance, polydipsia and polyuria.  Musculoskeletal: Positive for arthralgias, joint swelling and myalgias. Negative for back pain and neck pain.       Right knee and right ankle pain with swelling. Having to walk with limp due to increased pain and swelling of the ankle.   Skin: Negative for rash.  Allergic/Immunologic: Negative for environmental allergies.  Neurological: Negative for dizziness, tremors, numbness and headaches.  Hematological: Negative for adenopathy. Does not bruise/bleed easily.  Psychiatric/Behavioral: Negative for behavioral problems (Depression), sleep disturbance and suicidal ideas. The patient is not nervous/anxious.     Today's Vitals   07/27/20 1534  BP: (!) 157/88  Pulse: 86  Resp: 16  Temp: 97.9 F (36.6 C)  SpO2: 92%  Weight: 262 lb 3.2 oz (118.9 kg)  Height: 5\' 7"  (1.702 m)   Body mass index is 41.07 kg/m.  Physical Exam Vitals and nursing  note reviewed.  Constitutional:      General: She is not in acute distress.    Appearance: Normal appearance. She is well-developed and well-nourished. She is obese. She is not diaphoretic.  HENT:     Head: Normocephalic and atraumatic.     Nose: Nose normal.     Mouth/Throat:     Mouth: Oropharynx is clear and moist.     Pharynx: No oropharyngeal exudate.  Eyes:     Extraocular Movements: EOM normal.     Pupils: Pupils are equal, round, and reactive to light.  Neck:     Thyroid: No thyromegaly.     Vascular: No JVD.     Trachea: No tracheal deviation.  Cardiovascular:     Rate and Rhythm: Normal rate and regular rhythm.     Heart sounds: Normal heart sounds. No murmur heard. No friction rub. No gallop.   Pulmonary:     Effort: Pulmonary  effort is normal. No respiratory distress.     Breath sounds: Normal breath sounds. No wheezing or rales.  Chest:     Chest wall: No tenderness.  Abdominal:     General: Bowel sounds are normal.     Palpations: Abdomen is soft.     Tenderness: There is abdominal tenderness.     Comments:  Large ventral hernia, more severe with exertion. Mildly tender with palpation.   Musculoskeletal:        General: Normal range of motion.     Cervical back: Normal range of motion and neck supple.  Lymphadenopathy:     Cervical: No cervical adenopathy.  Skin:    General: Skin is warm and dry.  Neurological:     Mental Status: She is alert and oriented to person, place, and time.     Cranial Nerves: No cranial nerve deficit.  Psychiatric:        Mood and Affect: Mood and affect normal.        Behavior: Behavior normal.        Thought Content: Thought content normal.        Judgment: Judgment normal.    Assessment/Plan: 1. Ventral hernia without obstruction or gangrene Reviewed CT scan results with the patient today. Shows Ventral abdominopelvic wall laxity, without well-defined hernia. Will refer to general surgery for further evaluation and possible  treatment.  - Ambulatory referral to General Surgery  2. Uterine leiomyoma, unspecified location Reviewed CT scan results with the patient today. Showed soft tissue fullness within the upper pelvis, most likely due to progression of fibroid uterus when compared to 04/13/2007 pelvic CT. Will get transvaginal ultrasound for further evaluation.  - US Pelvic Complete With Transvaginal; Future  3. Aortic atherosclerosis ([place]) Start patient on crestor 5mg  daily.  - rosuvastatin (CRESTOR) 5 MG tablet; Take 1 tablet (5 mg total) by mouth daily.  Dispense: 90 tablet; Refill: 1  4. Essential hypertension Generally well managed. Continue bp medication as prescribed.   General Counseling: alyson ki understanding of the findings of todays visit and agrees with plan of treatment. I have discussed any further diagnostic evaluation that may be needed or ordered today. We also reviewed her medications today. she has been encouraged to call the office with any questions or concerns that should arise related to todays visit.  This patient was seen by Leretha Pol FNP Collaboration with Dr Lavera Guise as a part of collaborative care agreement  Orders Placed This Encounter  Procedures  . US Pelvic Complete With Transvaginal  . Ambulatory referral to General Surgery    Meds ordered this encounter  Medications  . rosuvastatin (CRESTOR) 5 MG tablet    Sig: Take 1 tablet (5 mg total) by mouth daily.    Dispense:  90 tablet    Refill:  1    Order Specific Question:   Supervising Provider    Answer:   Lavera Guise [[id number]]    Total time spent: 30 Minutes   Time spent includes review of chart, medications, test results, and follow up plan with the patient.      Dr Lavera Guise Internal medicine

## 2020-07-28 ENCOUNTER — Telehealth: Payer: Self-pay

## 2020-07-28 NOTE — Telephone Encounter (Signed)
Lmom

## 2020-07-28 NOTE — Telephone Encounter (Signed)
Pt notified by Kris Hartmann

## 2020-07-28 NOTE — Telephone Encounter (Signed)
I think we did talk about it, but please let her know that her echo shows normal function of the heart. There is borderline diastolic dysfunction. This means it is currently normal, but we need to make sure blood pressure control is good and we monitor this yearly. She has trace valvular regurgitation which is essentially normal. Thanks.

## 2020-08-01 ENCOUNTER — Other Ambulatory Visit: Payer: Self-pay

## 2020-08-01 ENCOUNTER — Ambulatory Visit
Admission: RE | Admit: 2020-08-01 | Discharge: 2020-08-01 | Disposition: A | Discharge status: Home / Self Care | Payer: Managed Care, Other (non HMO) | Source: Ambulatory Visit | Attending: Nurse Practitioner | Admitting: Nurse Practitioner

## 2020-08-01 DIAGNOSIS — D259 Leiomyoma of uterus, unspecified: Secondary | ICD-10-CM | POA: Insufficient documentation

## 2020-08-04 ENCOUNTER — Ambulatory Visit: Payer: Managed Care, Other (non HMO) | Admitting: Nurse Practitioner

## 2020-08-08 ENCOUNTER — Other Ambulatory Visit: Payer: Self-pay

## 2020-08-08 ENCOUNTER — Encounter: Payer: Self-pay | Admitting: General Surgery

## 2020-08-08 ENCOUNTER — Ambulatory Visit: Payer: Managed Care, Other (non HMO) | Admitting: General Surgery

## 2020-08-08 VITALS — BP 179/95 | HR 68 | Temp 98.3°F | Ht 67.0 in | Wt 266.2 lb

## 2020-08-08 DIAGNOSIS — K439 Ventral hernia without obstruction or gangrene: Secondary | ICD-10-CM

## 2020-08-08 DIAGNOSIS — M6208 Separation of muscle (nontraumatic), other site: Secondary | ICD-10-CM | POA: Diagnosis not present

## 2020-08-08 DIAGNOSIS — I10 Essential (primary) hypertension: Secondary | ICD-10-CM

## 2020-08-08 MED ORDER — BISOPROLOL FUMARATE 5 MG PO TABS: 5.0000 mg | ORAL_TABLET | Freq: Every day | ORAL | 3 refills | Status: DC

## 2020-08-08 NOTE — Progress Notes (Signed)
Patient ID: Jill Banks, female   DOB: 1955/12/20, 64 y.o.   MRN: [id number]  Chief Complaint  Patient presents with  . New Patient (Initial Visit)    ventral hernia    HPI Jill Banks is a 64 y.o. female.  She has been referred by her primary care provider, Jill Pol, NP, for evaluation of a ventral hernia.  [name] states that she has noticed a bulge in her mid abdomen for about 5 years.  She feels like it is getting larger.  She sometimes experiences a pulling sensation or discomfort in her abdomen.  She denies any nausea or vomiting.  No fevers or chills.  No alteration in bowel function aside from intermittent constipation.  She has never had abdominal surgery.  She is morbidly obese with a body mass index of 41.69 kg/m.  She did have a CT scan performed 07/18/20 that shows abdominal wall laxity without evidence of a hernia.   Past Medical History:  Diagnosis Date  . Anxiety   . Arthritis   . Bruises easily   . Headache   . Hypertension   . Leg pain   . Pneumonia    about 5 yrs ago  . Sleep apnea     Past Surgical History:  Procedure Laterality Date  . APPLICATION OF WOUND VAC 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   Procedure: APPLICATION OF WOUND VAC;  Surgeon: Hessie Knows, MD;  Location: [facility] ORS;  Service: Orthopedics;  Laterality: Left;  #[id number]  . DILATION AND CURETTAGE OF UTERUS     x 3  . TOE SURGERY Left    second toe  . 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 Hypertension Father   . Stroke Father   . Hypertension Brother   . Stroke Brother   . Hyperlipidemia Brother   . Asthma Son     Social History Social History   Tobacco Use  . Smoking status: Former Research scientist (life sciences)  . Smokeless tobacco: Never Used  Vaping Use  . Vaping Use: Never used  Substance Use Topics  . Alcohol use:  Yes    Comment: ocassionally  . Drug use: No    No Known Allergies  Current Outpatient Medications  Medication Sig Dispense Refill  . amLODipine (NORVASC) 2.5 MG tablet Take 2 tablets (5 mg total) by mouth daily. 60 tablet 2  . bisoprolol (ZEBETA) 5 MG tablet Take 1 tablet (5 mg total) by mouth daily. 30 tablet 3  . calcitRIOL (ROCALTROL) 0.25 MCG capsule Take 1 capsule (0.25 mcg total) by mouth daily. 90 capsule 5  . diclofenac sodium (VOLTAREN) 1 % GEL Apply 2 g topically 4 (four) times daily as needed (knee pain).    . hydrochlorothiazide (HYDRODIURIL) 25 MG tablet Take 1 tablet (25 mg total) by mouth daily. 90 tablet 5  . NON FORMULARY cpap device    . potassium chloride SA (KLOR-CON M20) 20 MEQ tablet Take 1 tablet (20 mEq total) by mouth 2 (two) times daily. 60 tablet 5  . rosuvastatin (CRESTOR) 5 MG tablet Take 1 tablet (5 mg total) by mouth daily. 90 tablet 1   No current facility-administered medications for this visit.    Review of Systems Review of Systems  Eyes: Positive for visual disturbance.       She describes "occasionally black dots  flying around."  Respiratory: Positive for shortness of breath.        With walking long distances  Cardiovascular: Positive for leg swelling.  Musculoskeletal: Positive for arthralgias.  Neurological: Positive for numbness.       Tingling that occurs when she is sleeping at night.  This is predominantly in her fingers.  All other systems reviewed and are negative.   Blood pressure (!) 179/95, pulse 68, temperature 98.3 F (36.8 C), temperature source Oral, height 5\' 7"  (1.702 m), weight 266 lb 3.2 oz (120.7 kg), SpO2 95 %. Body mass index is 41.69 kg/m.  Physical Exam Physical Exam Constitutional:      General: She is not in acute distress.    Appearance: She is obese.  HENT:     Head: Normocephalic and atraumatic.     Nose:     Comments: Covered with a mask    Mouth/Throat:     Comments: Covered with a mask Eyes:      General: No scleral icterus.       Right eye: No discharge.        Left eye: No discharge.  Neck:     Comments: The thyroid is diffusely mildly enlarged.  No discrete masses palpable.  The trachea is midline and the gland moves freely with deglutition.  No palpable cervical or supraclavicular lymphadenopathy. Cardiovascular:     Rate and Rhythm: Normal rate and regular rhythm.     Heart sounds: No murmur heard.   Pulmonary:     Effort: Pulmonary effort is normal. No respiratory distress.     Breath sounds: Normal breath sounds.  Abdominal:     General: Bowel sounds are normal.     Palpations: Abdomen is soft.     Comments: Protuberant, consistent with her level of obesity.  When she performs a half sit-up, she has evidence of diastasis rectus without any fascial defect.  No hernia.  Genitourinary:    Comments: Deferred Musculoskeletal:        General: No swelling.     Right lower leg: Edema present.  Skin:    General: Skin is warm and dry.  Neurological:     General: No focal deficit present.     Mental Status: She is alert and oriented to person, place, and time.  Psychiatric:        Mood and Affect: Mood normal.        Behavior: Behavior normal.     Data Reviewed I reviewed the clinic note from 07/04/2020. At this visit, [name] reports in the history of present illness that "the patient has a large abdominal hernia which has become larger of the course of time. Also gets larger and much more noticeable with exertion. It does get tender at time. She states that bowel habits are normal. She has not developed nausea, vomiting, or diarrhea." Her physical exam also comments on a large ventral hernia. A CT scan was ordered as a result of this visit.  I personally reviewed the CT scan and concur with the radiology interpretation which is copied here:  CLINICAL DATA:  Palpable abdominal mass. Chronic ventral hernia with feeling of enlargement. Pain at navel. COVID-19 last  August.  EXAM: CT ABDOMEN WITH CONTRAST  TECHNIQUE: Multidetector CT imaging of the abdomen was performed using the standard protocol following bolus administration of intravenous contrast.  CONTRAST:  179mL OMNIPAQUE IOHEXOL 300 MG/ML  SOLN  COMPARISON:  Pelvic ultrasound report of 04/03/2001. This describes fibroid uterus. Pelvic CT  of 04/13/2007 is also reviewed.  FINDINGS: Lower chest: Clear lung bases. Borderline cardiomegaly, without pericardial or pleural effusion.  Hepatobiliary: Low-density hepatic lesions are likely cysts. Some are too small to characterize. Phrygian cap. No gallstone or biliary duct dilatation.  Pancreas: Normal, without mass or ductal dilatation.  Spleen: No splenomegaly. A 1.7 cm hypoattenuating splenic lesion is nonspecific on 20/2.  Adrenals/Urinary Tract: Normal adrenal glands. Normal right kidney. Minimal left-sided caliectasis and hydroureter proximally. The mid left ureter is difficult to follow but not felt to be dilated.  Stomach/Bowel: Normal stomach, without wall thickening. Scattered colonic diverticula. Normal terminal ileum. Normal small bowel.  Vascular/Lymphatic: Aortic atherosclerosis. No retroperitoneal or retrocrural adenopathy.  Other: No ascites. No abdominal wall hernia identified. There is abdominal wall laxity with nonobstructive large and small bowel within, including on 49/2.  Incompletely imaged soft tissue fullness within the upper central pelvis is likely related to progressive uterine fibroids, but technically indeterminate on 54/2.  Musculoskeletal: Lumbosacral spondylosis with grade 1 L4-5 anterolisthesis. Mild convex right lumbar spine curvature.  IMPRESSION: 1. Ventral abdominopelvic wall laxity, without well-defined hernia. 2. Soft tissue fullness within the upper pelvis, most likely due to progression of fibroid uterus when compared to 04/13/2007 pelvic CT. Pelvis not imaged today.  Consider further evaluation with pelvic ultrasound. 3. Minimal left-sided caliectasis and proximal hydroureter. Favored to be chronic and possibly related to a crossing vessel. Less likely, this could be secondary to mass effect from presumed uterine enlargement. 4.  Aortic Atherosclerosis (ICD10-I70.0). 5. Nonspecific splenic lesion, most likely a hemangioma or hamartoma. Per consensus criteria, follow-up with pre and post contrast abdominal MRI at 6-12 months suggested. This recommendation follows ACR consensus guidelines: White Paper of the ACR Incidental Findings Committee II on Splenic and Nodal Findings. [place] [phone].   Electronically Signed   By: Abigail Miyamoto M.D.   On: 07/18/2020 14:44  A telephone note from [name] indicates the plan for referral to surgery for abdominal wall laxity.  Assessment This is a 64 year old morbidly obese female who presented for evaluation of a possible ventral hernia. Both physical examination and CT findings are not consistent with a hernia. She does have diastasis rectus with attenuated abdominal wall muscles.  Plan As there is no hernia present, there is no risk of bowel incarceration or strangulation. Typically, diastasis rectus does not require surgical intervention. I believe the thing most likely to resolve her discomfort would be substantial weight loss. There are some surgeons who do perform plication of diastasis, but this would be contraindicated at her morbidly obese BMI of 41.69. I did offer her a referral to a medical weight management provider, but she will be seeing [name] soon and wanted to defer to her for any referrals. No further general surgery indications at this time. I will see her on an as-needed basis.    Fredirick Maudlin 08/08/2020, 10:30 AM

## 2020-08-08 NOTE — Patient Instructions (Addendum)
Try to maintain a healthy weight. You may wear an abdominal binder to help reinforce your abdominal wall. Your PCP can place a referral to Medical weight loss management. Please call our office with any questions or concerns.   Diastasis Recti  Diastasis recti is when the muscles of the abdomen (rectus abdominis muscles) become thin and separate. The result is a wider space between the right and left abdomen (abdominal) muscles. This wider space between the muscles may cause a bulge in the middle of your abdomen. You may notice this bulge when you are straining or when you sit up from a lying down position. Diastasis recti can affect men and women. It is most common among pregnant women, infants, people who are obese, and people who have had abdominal surgery. Exercise or surgical treatment may help correct it. What are the causes? Common causes of this condition include:  Pregnancy. The growing uterus puts pressure on the abdominal muscles, which causes the muscles to separate.  Obesity. Excess fat puts pressure on abdominal muscles.  Weightlifting.  Some abdomen exercises.  Advanced age.  Genetics.  Prior abdominal surgery. What increases the risk? This condition is more likely to develop in:  Women.  Newborns, especially newborns who are born early (prematurely). What are the signs or symptoms? Common symptoms of this condition include:  A bulge in the middle of the abdomen. You will notice it most when you sit up or strain.  Pain in the low back, pelvis, or hips.  Constipation.  Inability to control when you urinate (urinary incontinence).  Bloating.  Poor posture. How is this diagnosed? This condition is diagnosed with a physical exam. Your health care provider will ask you to lie flat on your back and do a crunch or half sit-up. If you have diastasis recti, a vertical bulge will appear between your abdominal muscles in the center of your abdomen. Your health care  provider will measure the gap between your muscles with one of the following:  A medical device used to measure the space between two objects (caliper).  A tape measure.  CT scan.  Ultrasound.  Finger spaces. Your health care provider will measure the space using their fingers. How is this treated? If your muscle separation is not too large, you may not need treatment. However, if you are a woman who plans to become pregnant again, you should treat this condition before your next pregnancy. Treatment may include:  Physical therapy to strengthen and tighten your abdominal muscles.  Lifestyle changes such as weight loss and exercise.  Over-the-counter pain medicines as needed.  Surgery to correct the separation. Follow these instructions at home: Activity  Return to your normal activities as told by your health care provider. Ask your health care provider what activities are safe for you.  When lifting weights or doing exercises using your abdominal muscles or the muscles in the center of your body that give stability (core muscles), make sure you are doing your exercises and movements correctly. Proper form can help to prevent the condition from happening again. General instructions  If you are overweight, ask your health care provider for help with weight loss. Losing even a small amount of weight can help to improve your diastasis recti.  Take over-the-counter or prescription medicines only as told by your health care provider.  Do not strain. Straining can make the separation worse. Examples of straining include: ? Pushing hard to have a bowel movement, such as due to constipation. ? Lifting  heavy objects, including children. ? Standing up and sitting down.  Take steps to prevent constipation: ? Drink enough fluid to keep your urine clear or pale yellow. ? Take over-the-counter or prescription medicines only as directed. ? Eat foods that are high in fiber, such as fresh  fruits and vegetables, whole grains, and beans. ? Limit foods that are high in fat and processed sugars, such as fried and sweet foods. Contact a health care provider if:  You notice a new bulge in your abdomen. Get help right away if:  You experience severe discomfort in your abdomen.  You develop severe abdominal pain along with nausea, vomiting, or fever. Summary  Diastasis recti is when the abdomen (abdominal) muscles become thin and separate. Your abdomen will stick out because the space between your right and left abdomen muscles has widened.  The most common symptom is a bulge in your abdomen. You will notice it most when you sit up or are straining.  This condition is diagnosed during a physical exam.  If the abdomen separation is not too big, you may choose not to have treatment. Otherwise, you may need to undergo physical therapy or surgery. This information is not intended to replace advice given to you by your health care provider. Make sure you discuss any questions you have with your health care provider. Document Revised: 08/15/2017 Document Reviewed: 10/28/2016 Elsevier Patient Education  2020 [employer].

## 2020-08-14 ENCOUNTER — Other Ambulatory Visit: Payer: Self-pay

## 2020-08-14 ENCOUNTER — Ambulatory Visit: Payer: Managed Care, Other (non HMO) | Admitting: Nurse Practitioner

## 2020-08-17 ENCOUNTER — Other Ambulatory Visit: Payer: Self-pay

## 2020-08-17 ENCOUNTER — Encounter: Payer: Self-pay | Admitting: Nurse Practitioner

## 2020-08-17 ENCOUNTER — Ambulatory Visit: Payer: Managed Care, Other (non HMO) | Admitting: Nurse Practitioner

## 2020-08-17 VITALS — BP 148/86 | HR 77 | Temp 98.1°F | Resp 16 | Ht 67.0 in | Wt 260.4 lb

## 2020-08-17 DIAGNOSIS — I10 Essential (primary) hypertension: Secondary | ICD-10-CM

## 2020-08-17 DIAGNOSIS — D259 Leiomyoma of uterus, unspecified: Secondary | ICD-10-CM | POA: Diagnosis not present

## 2020-08-17 DIAGNOSIS — Z6841 Body Mass Index (BMI) 40.0 and over, adult: Secondary | ICD-10-CM | POA: Diagnosis not present

## 2020-08-17 DIAGNOSIS — N852 Hypertrophy of uterus: Secondary | ICD-10-CM

## 2020-08-17 MED ORDER — AMLODIPINE BESYLATE 10 MG PO TABS: 10.0000 mg | ORAL_TABLET | Freq: Every day | ORAL | 2 refills | Status: DC

## 2020-08-17 NOTE — Progress Notes (Signed)
[facility] [place], [place] [address]  Internal MEDICINE  Office Visit Note  Patient Name: Jill Banks  [id number]  [id number]  Date of Service: 09/17/2020  Chief Complaint  Patient presents with  . Follow-up    [place]   . Hypertension  . controlled substance form    reviewed with PT    The patient is here for follow up visit. Had transvaginal ultrasound of uterus doe to enlarged uterus noted on CT of abdomen done prior to her last visit with me. The recent pelvic ultrasound that she had showed four uterine fibroids. Two of them measure >5cm in diameter, one measures >6cm in diameter, and the fourth measures between 2 and 3 cm in diameter. She states that she gets intermittent sharp pains in the pelvic region. Also has vaginal bleeding once or twice a year. She is aware that she does have uterine fibroids. Were not this large at last examination.  Blood pressure moderately elevated today. Has been elevated since ARB had to be changed to irbesartan. She states that blood pressure did the best on olmesartan (Benicar) but, at some point, this medication was not preferred by her insurance.  Had echocardiogram since her last visit. She has normal LVEF wth borderline diastolic dysfunction. She has trace valvular regurgitation.  Would like to discuss weight management.  -decreased calorie intake  -increase intake of low density foods.  -increase low-impact physical activity -document caloric intake and expenditure.  -nova weight loss information provided. Will discuss in more detail at her next visit       Current Medication: Outpatient Encounter Medications as of 08/17/2020  Medication Sig  . amLODipine (NORVASC) 10 MG tablet Take 1 tablet (10 mg total) by mouth daily.  . bisoprolol (ZEBETA) 5 MG tablet Take 1 tablet (5 mg total) by mouth daily.  . calcitRIOL (ROCALTROL) 0.25 MCG capsule Take 1 capsule (0.25 mcg total) by mouth daily.  . diclofenac sodium  (VOLTAREN) 1 % GEL Apply 2 g topically 4 (four) times daily as needed (knee pain).  . hydrochlorothiazide (HYDRODIURIL) 25 MG tablet Take 1 tablet (25 mg total) by mouth daily.  . NON FORMULARY cpap device  . potassium chloride SA (KLOR-CON M20) 20 MEQ tablet Take 1 tablet (20 mEq total) by mouth 2 (two) times daily.  . rosuvastatin (CRESTOR) 5 MG tablet Take 1 tablet (5 mg total) by mouth daily.  . [DISCONTINUED] amLODipine (NORVASC) 2.5 MG tablet Take 2 tablets (5 mg total) by mouth daily.   No facility-administered encounter medications on file as of 08/17/2020.    Surgical History: Past Surgical History:  Procedure Laterality Date  . APPLICATION OF WOUND VAC 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   Procedure: APPLICATION OF WOUND VAC;  Surgeon: Hessie Knows, MD;  Location: [facility] ORS;  Service: Orthopedics;  Laterality: Left;  #[id number]  . DILATION AND CURETTAGE OF UTERUS     x 3  . TOE SURGERY Left    second toe  . 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 Anxiety   . Arthritis   . Bruises easily   . Headache   . Hypertension   . Leg pain   . Pneumonia    about 5 yrs ago  . Sleep apnea     Family History: Family History  Problem Relation  Age of Onset  . Hypertension Father   . Stroke Father   . Hypertension Brother   . Stroke Brother   . Hyperlipidemia Brother   . Asthma Son     Social History   Socioeconomic History  . Marital status: Married    Spouse name: Not on file  . Number of children: Not on file  . Years of education: Not on file  . Highest education level: Not on file  Occupational History  . Not on file  Tobacco Use  . Smoking status: Former Research scientist (life sciences)  . Smokeless tobacco: Never Used  Vaping Use  . Vaping Use: Never used  Substance and Sexual Activity  . Alcohol use: Yes     Comment: ocassionally  . Drug use: No  . Sexual activity: Yes  Other Topics Concern  . Not on file  Social History Narrative  . Not on file   Social Determinants of Health   Financial Resource Strain: Not on file  Food Insecurity: Not on file  Transportation Needs: Not on file  Physical Activity: Not on file  Stress: Not on file  Social Connections: Not on file  Intimate Partner Violence: Not on file      Review of Systems  Constitutional: Negative for chills, fatigue and unexpected weight change.       [place] weight loss since her last visit   HENT: Negative for congestion, postnasal drip, rhinorrhea, sneezing and sore throat.   Respiratory: Negative for cough, chest tightness, shortness of breath and wheezing.   Cardiovascular: Negative for chest pain and palpitations.       Mildly elevated blood pressure today.   Gastrointestinal: Negative for abdominal pain, constipation, diarrhea, nausea and vomiting.  Endocrine: Negative for cold intolerance, polydipsia and polyuria.  Genitourinary: Negative for dysuria, frequency and urgency.  Musculoskeletal: Negative for arthralgias, back pain, joint swelling and neck pain.  Skin: Negative for rash.  Neurological: Negative.  Negative for tremors and numbness.  Hematological: Negative for adenopathy. Does not bruise/bleed easily.  Psychiatric/Behavioral: Negative for behavioral problems (Depression), sleep disturbance and suicidal ideas. The patient is not nervous/anxious.     Today's Vitals   08/17/20 1435  BP: (!) 148/86  Pulse: 77  Resp: 16  Temp: 98.1 F (36.7 C)  SpO2: 94%  Weight: 260 lb 6.4 oz (118.1 kg)  Height: 5\' 7"  (1.702 m)   Body mass index is 40.78 kg/m.  Physical Exam Vitals and nursing note reviewed.  Constitutional:      General: She is not in acute distress.    Appearance: Normal appearance. She is well-developed and well-nourished. She is obese. She is not diaphoretic.  HENT:     Head:  Normocephalic and atraumatic.     Mouth/Throat:     Mouth: Oropharynx is clear and moist.     Pharynx: No oropharyngeal exudate.  Eyes:     Extraocular Movements: EOM normal.     Pupils: Pupils are equal, round, and reactive to light.  Neck:     Thyroid: No thyromegaly.     Vascular: No carotid bruit or JVD.     Trachea: No tracheal deviation.  Cardiovascular:     Rate and Rhythm: Normal rate and regular rhythm.     Heart sounds: Normal heart sounds. No murmur heard. No friction rub. No gallop.   Pulmonary:     Effort: Pulmonary effort is normal. No respiratory distress.     Breath sounds: Normal breath sounds. No wheezing or rales.  Chest:  Chest wall: No tenderness.  Abdominal:     Palpations: Abdomen is soft.  Musculoskeletal:        General: Normal range of motion.     Cervical back: Normal range of motion and neck supple.  Lymphadenopathy:     Cervical: No cervical adenopathy.  Skin:    General: Skin is warm and dry.  Neurological:     General: No focal deficit present.     Mental Status: She is alert and oriented to person, place, and time.     Cranial Nerves: No cranial nerve deficit.  Psychiatric:        Mood and Affect: Mood and affect and mood normal.        Behavior: Behavior normal.        Thought Content: Thought content normal.        Judgment: Judgment normal.    Assessment/Plan: 1. Uterine leiomyoma, unspecified location Recent pelvic ultrasound showing  four uterine fibroids. Two of them measure >5cm in diameter, one measures >6cm in diameter, and the fourth measures between 2 and 3 cm in diameter. Referral to GYN for further evaluation and treatment.  - Ambulatory referral to Gynecology  2. Uterine enlargement Uterine enlargement present on CT and ultrasound. This is likely due to large uterine fibroids. Refer to GYN for further evaluation and treatment. - Ambulatory referral to Gynecology  3. Essential hypertension Stable. Continue bp medication  as prescribed  - amLODipine (NORVASC) 10 MG tablet; Take 1 tablet (10 mg total) by mouth daily.  Dispense: 30 tablet; Refill: 2   4. BMI 40.0-44.9, adult [facility]) Discussed nonpharmacologic methods to lose weight including decreasing calorie intake, including high density foods. She should gradually incorporate exercise into her daily routine.    General Counseling: keniesha adderly understanding of the findings of todays visit and agrees with plan of treatment. I have discussed any further diagnostic evaluation that may be needed or ordered today. We also reviewed her medications today. she has been encouraged to call the office with any questions or concerns that should arise related to todays visit.  Obesity Counseling: Risk Assessment: An assessment of behavioral risk factors was made today and includes lack of exercise sedentary lifestyle, lack of portion control and poor dietary habits.  Risk Modification Advice: She was counseled on portion control guidelines. Restricting daily caloric intake to 1200 - 1500 calories per day. The detrimental long term effects of obesity on her health and ongoing poor compliance was also discussed with the patient.  This patient was seen by Leretha Pol FNP Collaboration with Dr Lavera Guise as a part of collaborative care agreement  Orders Placed This Encounter  Procedures  . Ambulatory referral to Gynecology    Meds ordered this encounter  Medications  . amLODipine (NORVASC) 10 MG tablet    Sig: Take 1 tablet (10 mg total) by mouth daily.    Dispense:  30 tablet    Refill:  2    Changed dosing to 10mg  daily    Order Specific Question:   Supervising Provider    Answer:   Lavera Guise [[id number]]    Total time spent: 30 Minutes    Time spent includes review of chart, medications, test results, and follow up plan with the patient.      Dr Lavera Guise Internal medicine

## 2020-08-26 DIAGNOSIS — D259 Leiomyoma of uterus, unspecified: Secondary | ICD-10-CM | POA: Insufficient documentation

## 2020-08-26 DIAGNOSIS — I700 Atherosclerosis of aorta: Secondary | ICD-10-CM | POA: Insufficient documentation

## 2020-08-28 ENCOUNTER — Encounter: Payer: Managed Care, Other (non HMO) | Admitting: Obstetrics and Gynecology

## 2020-09-11 ENCOUNTER — Other Ambulatory Visit: Payer: Self-pay

## 2020-09-11 ENCOUNTER — Encounter: Payer: Self-pay | Admitting: Obstetrics and Gynecology

## 2020-09-11 ENCOUNTER — Ambulatory Visit (INDEPENDENT_AMBULATORY_CARE_PROVIDER_SITE_OTHER): Payer: Managed Care, Other (non HMO) | Admitting: Obstetrics and Gynecology

## 2020-09-11 VITALS — BP 130/74 | HR 73 | Ht 67.0 in | Wt 263.0 lb

## 2020-09-11 DIAGNOSIS — D251 Intramural leiomyoma of uterus: Secondary | ICD-10-CM

## 2020-09-11 DIAGNOSIS — N950 Postmenopausal bleeding: Secondary | ICD-10-CM | POA: Diagnosis not present

## 2020-09-11 NOTE — Patient Instructions (Signed)
Hysteroscopy Hysteroscopy is a procedure that is used to examine the inside of a woman's womb (uterus). This may be done for various reasons, including:  To look for lumps (tumors) and other growths in the uterus.  To evaluate abnormal bleeding, fibroid tumors, polyps, scar tissue (adhesions), or cancer of the uterus.  To determine the cause of an inability to get pregnant (infertility) or repeated losses of pregnancies (miscarriages).  To find a lost IUD (intrauterine device).  To perform a procedure that permanently prevents pregnancy (sterilization). During this procedure, a thin, flexible tube with a small light and camera (hysteroscope) is used to examine the uterus. The camera sends images to a monitor in the room so that your health care provider can view the inside of your uterus. A hysteroscopy should be done right after a menstrual period to make sure that you are not pregnant. Tell a health care provider about:  Any allergies you have.  All medicines you are taking, including vitamins, herbs, eye drops, creams, and over-the-counter medicines.  Any problems you or family members have had with the use of anesthetic medicines.  Any blood disorders you have.  Any surgeries you have had.  Any medical conditions you have.  Whether you are pregnant or may be pregnant. What are the risks? Generally, this is a safe procedure. However, problems may occur, including:  Excessive bleeding.  Infection.  Damage to the uterus or other structures or organs.  Allergic reaction to medicines or fluids that are used in the procedure. What happens before the procedure? Staying hydrated Follow instructions from your health care provider about hydration, which may include:  Up to 2 hours before the procedure - you may continue to drink clear liquids, such as water, clear fruit juice, black coffee, and plain tea. Eating and drinking restrictions Follow instructions from your health care  provider about eating and drinking, which may include:  8 hours before the procedure - stop eating solid foods and drink clear liquids only  2 hours before the procedure - stop drinking clear liquids. General instructions  Ask your health care provider about: ? Changing or stopping your normal medicines. This is important if you take diabetes medicines or blood thinners. ? Taking medicines such as aspirin and ibuprofen. These medicines can thin your blood and cause bleeding. Do not take these medicines for 1 week before your procedure, or as told by your health care provider.  Do not use any products that contain nicotine or tobacco for 2 weeks before the procedure. This includes cigarettes and e-cigarettes. If you need help quitting, ask your health care provider.  Medicine may be placed in your cervix the day before the procedure. This medicine causes the cervix to have a larger opening (dilate). The larger opening makes it easier for the hysteroscope to be inserted into the uterus during the procedure.  Plan to have someone with you for the first 24-48 hours after the procedure, especially if you are given a medicine to make you fall asleep (general anesthetic).  Plan to have someone take you home from the hospital or clinic. What happens during the procedure?  To lower your risk of infection: ? Your health care team will wash or sanitize their hands. ? Your skin will be washed with soap. ? Hair may be removed from the surgical area.  An IV tube will be inserted into one of your veins.  You may be given one or more of the following: ? A medicine to help  you relax (sedative). ? A medicine that numbs the area around the cervix (local anesthetic). ? A medicine to make you fall asleep (general anesthetic).  A hysteroscope will be inserted through your vagina and into your uterus.  Air or fluid will be used to enlarge your uterus, enabling your health care provider to see your uterus  better. The amount of fluid used will be carefully checked throughout the procedure.  In some cases, tissue may be gently scraped from inside the uterus and sent to a lab for testing (biopsy). The procedure may vary among health care providers and hospitals. What happens after the procedure?  Your blood pressure, heart rate, breathing rate, and blood oxygen level will be monitored until the medicines you were given have worn off.  You may have some cramping. You may be given medicines for this.  You may have bleeding, which varies from light spotting to menstrual-like bleeding. This is normal.  If you had a biopsy done, it is your responsibility to get the results of your procedure. Ask your health care provider, or the department performing the procedure, when your results will be ready. Summary  Hysteroscopy is a procedure that is used to examine the inside of a woman's womb (uterus).  After the procedure, you may have bleeding, which varies from light spotting to menstrual-like bleeding. This is normal. You may also have cramping.  Plan to have someone take you home from the hospital or clinic. This information is not intended to replace advice given to you by your health care provider. Make sure you discuss any questions you have with your health care provider. Document Revised: 08/15/2017 Document Reviewed: 10/01/2016 Elsevier Patient Education  2020 Elsevier Inc.  Uterine Fibroids  Uterine fibroids (leiomyomas) are noncancerous (benign) tumors that can develop in the uterus. Fibroids may also develop in the fallopian tubes, cervix, or tissues (ligaments) near the uterus. You may have one or many fibroids. Fibroids vary in size, weight, and where they grow in the uterus. Some can become quite large. Most fibroids do not require medical treatment. What are the causes? The cause of this condition is not known. What increases the risk? You are more likely to develop this condition  if you:  Are in your 30s or 40s and have not gone through menopause.  Have a family history of this condition.  Are of African-American descent.  Had your first period at an early age (early menarche).  Have not had any children (nulliparity).  Are overweight or obese. What are the signs or symptoms? Many women do not have any symptoms. Symptoms of this condition may include:  Heavy menstrual bleeding.  Bleeding or spotting between periods.  Pain and pressure in the pelvic area, between the hips.  Bladder problems, such as needing to urinate urgently or more often than usual.  Inability to have children (infertility).  Failure to carry pregnancy to term (miscarriage). How is this diagnosed? This condition may be diagnosed based on:  Your symptoms and medical history.  A physical exam.  A pelvic exam that includes feeling for any tumors.  Imaging tests, such as ultrasound or MRI. How is this treated? Treatment for this condition may include:  Seeing your health care provider for follow-up visits to monitor your fibroids for any changes.  Taking NSAIDs such as ibuprofen, naproxen, or aspirin to reduce pain.  Hormone medicines. These may be taken as a pill, given in an injection, or delivered by a T-shaped device that is inserted   into the uterus (intrauterine device, IUD).  Surgery to remove one of the following: ? The fibroids (myomectomy). Your health care provider may recommend this if fibroids affect your fertility and you want to become pregnant. ? The uterus (hysterectomy). ? Blood supply to the fibroids (uterine artery embolization). Follow these instructions at home:  Take over-the-counter and prescription medicines only as told by your health care provider.  Ask your health care provider if you should take iron pills or eat more iron-rich foods, such as dark green, leafy vegetables. Heavy menstrual bleeding can cause low iron levels.  If directed, apply  heat to your back or abdomen to reduce pain. Use the heat source that your health care provider recommends, such as a moist heat pack or a heating pad. ? Place a towel between your skin and the heat source. ? Leave the heat on for 20-30 minutes. ? Remove the heat if your skin turns bright red. This is especially important if you are unable to feel pain, heat, or cold. You may have a greater risk of getting burned.  Pay close attention to your menstrual cycle. Tell your health care provider about any changes, such as: ? Increased blood flow that requires you to use more pads or tampons than usual. ? A change in the number of days that your period lasts. ? A change in symptoms that are associated with your period, such as back pain or cramps in your abdomen.  Keep all follow-up visits as told by your health care provider. This is important, especially if your fibroids need to be monitored for any changes. Contact a health care provider if you:  Have pelvic pain, back pain, or cramps in your abdomen that do not get better with medicine or heat.  Develop new bleeding between periods.  Have increased bleeding during or between periods.  Feel unusually tired or weak.  Feel light-headed. Get help right away if you:  Faint.  Have pelvic pain that suddenly gets worse.  Have severe vaginal bleeding that soaks a tampon or pad in 30 minutes or less. Summary  Uterine fibroids are noncancerous (benign) tumors that can develop in the uterus.  The exact cause of this condition is not known.  Most fibroids do not require medical treatment unless they affect your ability to have children (fertility).  Contact a health care provider if you have pelvic pain, back pain, or cramps in your abdomen that do not get better with medicines.  Make sure you know what symptoms should cause you to get help right away. This information is not intended to replace advice given to you by your health care  provider. Make sure you discuss any questions you have with your health care provider. Document Revised: 08/15/2017 Document Reviewed: 07/29/2017 Elsevier Patient Education  2020 Elsevier Inc.  

## 2020-09-11 NOTE — Progress Notes (Signed)
Patient ID: Jill Fieldnita R Hackley, female   DOB: October 01, 1955, 64 y.o.   MRN: [id number]  Reason for Consult: Gynecologic Exam and Referral (Fibroids)   Referred by Carlean JewsBoscia, Heather E, NP  Subjective:     HPI:  Jill Banks is a 64 y.o. female. She had an enlarged fibroid uterus noted recently on a abdominal CT and pelvic US. She was referred here for consultation regarding fibroid uterus. She reports a history of menorrhagia but says that this resolved. She does report that usually once a year in August or October she will have vaginal bleeding. This has been going on for several years. Generally light, but enough to require wearing a pad. She has some pain with intercourse. She denies pelvic pain, pressure, or urinary complaints related to the enlarged uterus.    Past Medical History:  Diagnosis Date  . Anxiety   . Arthritis   . Bruises easily   . Headache   . Hypertension   . Leg pain   . Pneumonia    about 5 yrs ago  . Sleep apnea    Family History  Problem Relation Age of Onset  . Hypertension Father   . Stroke Father   . Hypertension Brother   . Stroke Brother   . Hyperlipidemia Brother   . Asthma Son    Past Surgical History:  Procedure Laterality Date  . APPLICATION OF WOUND VAC 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   Procedure: APPLICATION OF WOUND VAC;  Surgeon: Kennedy BuckerMenz, Michael, MD;  Location: [facility] ORS;  Service: Orthopedics;  Laterality: Left;  #[id number]  . DILATION AND CURETTAGE OF UTERUS     x 3  . TOE SURGERY Left    second toe  . 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 Smoking status: Former Games developermoker  . Smokeless tobacco: Never Used  Substance Use Topics  . Alcohol use: Yes    Comment: ocassionally    No Known Allergies  Current Outpatient Medications  Medication Sig Dispense Refill   . amLODipine (NORVASC) 10 MG tablet Take 1 tablet (10 mg total) by mouth daily. 30 tablet 2  . bisoprolol (ZEBETA) 5 MG tablet Take 1 tablet (5 mg total) by mouth daily. 30 tablet 3  . calcitRIOL (ROCALTROL) 0.25 MCG capsule Take 1 capsule (0.25 mcg total) by mouth daily. 90 capsule 5  . diclofenac sodium (VOLTAREN) 1 % GEL Apply 2 g topically 4 (four) times daily as needed (knee pain).    . hydrochlorothiazide (HYDRODIURIL) 25 MG tablet Take 1 tablet (25 mg total) by mouth daily. 90 tablet 5  . NON FORMULARY cpap device    . potassium chloride SA (KLOR-CON M20) 20 MEQ tablet Take 1 tablet (20 mEq total) by mouth 2 (two) times daily. 60 tablet 5  . rosuvastatin (CRESTOR) 5 MG tablet Take 1 tablet (5 mg total) by mouth daily. 90 tablet 1   No current facility-administered medications for this visit.    Review of Systems  Constitutional: Negative for chills, fatigue, fever and unexpected weight change.  HENT: Negative for trouble swallowing.  Eyes: Negative for loss of vision.  Respiratory: Negative for cough, shortness of breath and wheezing.  Cardiovascular: Negative for chest pain, leg swelling, palpitations and syncope.  GI: Negative for abdominal pain, blood in stool, diarrhea, nausea and vomiting.  GU: Negative for difficulty urinating, dysuria, frequency and hematuria.  Musculoskeletal: Negative for back pain, leg pain and joint pain.  Skin: Negative for rash.  Neurological: Negative for dizziness, headaches, light-headedness, numbness and seizures.  Psychiatric: Negative for behavioral problem, confusion, depressed mood and sleep disturbance.        Objective:  Objective   Vitals:   09/11/20 1407  BP: 130/74  Pulse: 73  Weight: 263 lb (119.3 kg)  Height: 5\' 7"  (1.702 m)   Body mass index is 41.19 kg/m.  Physical Exam Vitals and nursing note reviewed.  Constitutional:      Appearance: She is well-developed and well-nourished.  HENT:     Head: Normocephalic and  atraumatic.  Eyes:     Extraocular Movements: EOM normal.     Pupils: Pupils are equal, round, and reactive to light.  Cardiovascular:     Rate and Rhythm: Normal rate and regular rhythm.  Pulmonary:     Effort: Pulmonary effort is normal. No respiratory distress.  Genitourinary:    Comments: External: Normal appearing vulva. No lesions noted.  Speculum examination: Normal appearing cervix. No blood in the vaginal vault. No discharge.   Bimanual examination: Uterus midline, non-tender, slight enlargement and irregular contour.  No CMT. No adnexal masses. No adnexal tenderness. Pelvis not fixed. Skin:    General: Skin is warm and dry.  Neurological:     Mental Status: She is alert and oriented to person, place, and time.  Psychiatric:        Mood and Affect: Mood and affect normal.        Behavior: Behavior normal.        Thought Content: Thought content normal.        Judgment: Judgment normal.     Assessment/Plan:     64 yo with enlarged fibroid uterus and postmenopausal bleeding.  1. Reviewed option for fibroid management. She is not bothered by her enlarged fibroid uterus and desires no intervention at this time.   2. Will plan hysteroscopy D&C for postmenopausal bleeding. Patient declines office biopsy today. Has had a EMB in the past and it was very painful.   More than 30 minutes were spent face to face with the patient in the room, reviewing the medical record, labs and images, and coordinating care for the patient. The plan of management was discussed in detail and counseling was provided.      77 MD [facility], [facility] 09/11/2020 2:49 PM

## 2020-09-17 DIAGNOSIS — N852 Hypertrophy of uterus: Secondary | ICD-10-CM | POA: Insufficient documentation

## 2020-09-17 DIAGNOSIS — Z6841 Body Mass Index (BMI) 40.0 and over, adult: Secondary | ICD-10-CM | POA: Insufficient documentation

## 2020-09-27 ENCOUNTER — Telehealth: Payer: Self-pay | Admitting: Obstetrics and Gynecology

## 2020-09-27 NOTE — Telephone Encounter (Signed)
LMPTRC 

## 2020-09-27 NOTE — Telephone Encounter (Signed)
Patient rtn'd call to schedule Hysteroscopy D&C w Gilman Schmidt  DOS 10/17/20  H&P N/A  Covid testing 1/28 @ 8-10:30, [address], drive up and wear mask. Advised pt to quarantine until DOS.  Pre-admit phone call appointment to be requested - date and time will be included on H&P paper work. Also all appointments will be updated on pt MyChart. Explained that this appointment has a call window. Based on the time scheduled will indicate if the call will be received within a 4 hour window before 1:00 or after.  Advised that pt may also receive calls from the hospital pharmacy and pre-service center.  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 No secondary insurance.

## 2020-09-27 NOTE — Telephone Encounter (Signed)
-----   Message from Homero Fellers, MD sent at 09/11/2020  2:39 PM EST ----- Surgery Booking Request Patient Full Name:  Jill Banks  MRN: [id number]  DOB: Feb 08, 1956  Surgeon: Homero Fellers, MD  Requested Surgery Date and Time: next 1-3 months Primary Diagnosis AND Code: postmenopausal bleeding Secondary Diagnosis and Code:  Surgical Procedure: Hysteroscopy D&C RNFA Requested?: No L&D Notification: No Admission Status: same day surgery Length of Surgery: 50 min Special Case Needs: No H&P: No Phone Interview???:  Yes Interpreter: No Medical Clearance:  No Special Scheduling Instructions: No Any known health/anesthesia issues, diabetes, sleep apnea, latex allergy, defibrillator/pacemaker?: No Acuity: P3   (P1 highest, P2 delay may cause harm, P3 low, elective gyn, P4 lowest)

## 2020-09-28 ENCOUNTER — Encounter: Payer: Self-pay | Admitting: Nurse Practitioner

## 2020-09-28 ENCOUNTER — Other Ambulatory Visit: Payer: Self-pay

## 2020-09-28 ENCOUNTER — Ambulatory Visit: Payer: Managed Care, Other (non HMO) | Admitting: Nurse Practitioner

## 2020-09-28 VITALS — BP 156/80 | HR 80 | Temp 97.4°F | Resp 16 | Ht 67.0 in | Wt 261.8 lb

## 2020-09-28 DIAGNOSIS — I10 Essential (primary) hypertension: Secondary | ICD-10-CM

## 2020-09-28 DIAGNOSIS — R7301 Impaired fasting glucose: Secondary | ICD-10-CM | POA: Diagnosis not present

## 2020-09-28 DIAGNOSIS — Z6841 Body Mass Index (BMI) 40.0 and over, adult: Secondary | ICD-10-CM

## 2020-09-28 DIAGNOSIS — E876 Hypokalemia: Secondary | ICD-10-CM

## 2020-09-28 MED ORDER — AMLODIPINE BESYLATE 10 MG PO TABS: 10.0000 mg | ORAL_TABLET | Freq: Every day | ORAL | 1 refills | Status: DC

## 2020-09-28 MED ORDER — POTASSIUM CHLORIDE CRYS ER 20 MEQ PO TBCR: 20.0000 meq | EXTENDED_RELEASE_TABLET | Freq: Two times a day (BID) | ORAL | 1 refills | Status: DC

## 2020-09-28 MED ORDER — RYBELSUS 3 MG PO TABS: 3.0000 mg | ORAL_TABLET | Freq: Every day | ORAL | 2 refills | Status: DC

## 2020-09-28 MED ORDER — BISOPROLOL FUMARATE 5 MG PO TABS: 5.0000 mg | ORAL_TABLET | Freq: Every day | ORAL | 1 refills | Status: DC

## 2020-09-28 NOTE — Progress Notes (Signed)
[facility] [place], [place] [address]  Internal MEDICINE  Office Visit Note  Patient Name: Jill Banks  [id number]  [id number]  Date of Service: 10/07/2020  Chief Complaint  Patient presents with  . Follow-up    The patient Is here for routine follow up.  -blood pressure slightly elevated.  Would like to discuss weight management options. She has already  -decreased calorie intake  -increase intake of low density foods.  -increase low-impact physical activity -document caloric intake and expenditure.        Current Medication: Outpatient Encounter Medications as of 09/28/2020  Medication Sig Note  . calcitRIOL (ROCALTROL) 0.25 MCG capsule Take 1 capsule (0.25 mcg total) by mouth daily.   . diclofenac sodium (VOLTAREN) 1 % GEL Apply 2 g topically 4 (four) times daily as needed (knee pain).   . hydrochlorothiazide (HYDRODIURIL) 25 MG tablet Take 1 tablet (25 mg total) by mouth daily.   . NON FORMULARY cpap device   . rosuvastatin (CRESTOR) 5 MG tablet Take 1 tablet (5 mg total) by mouth daily.   . Semaglutide (RYBELSUS) 3 MG TABS Take 3 mg by mouth daily. 10/03/2020: Hasnt started  . [DISCONTINUED] amLODipine (NORVASC) 10 MG tablet Take 1 tablet (10 mg total) by mouth daily.   . [DISCONTINUED] bisoprolol (ZEBETA) 5 MG tablet Take 1 tablet (5 mg total) by mouth daily.   . [DISCONTINUED] potassium chloride SA (KLOR-CON M20) 20 MEQ tablet Take 1 tablet (20 mEq total) by mouth 2 (two) times daily.   Marland Kitchen amLODipine (NORVASC) 10 MG tablet Take 1 tablet (10 mg total) by mouth daily.   . bisoprolol (ZEBETA) 5 MG tablet Take 1 tablet (5 mg total) by mouth daily.   . potassium chloride SA (KLOR-CON M20) 20 MEQ tablet Take 1 tablet (20 mEq total) by mouth 2 (two) times daily.    No facility-administered encounter medications on file as of 09/28/2020.    Surgical History: Past Surgical History:  Procedure Laterality Date  . APPLICATION OF WOUND VAC Left  07/27/2019   Procedure: APPLICATION OF WOUND VAC;  Surgeon: Hessie Knows, MD;  Location: [facility] ORS;  Service: Orthopedics;  Laterality: Left;  #[id number]  . DILATION AND CURETTAGE OF UTERUS     x 3  . TOE SURGERY Left    second toe  . 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 Anxiety   . Arthritis   . Bruises easily   . Headache   . Hypertension   . Leg pain   . Pneumonia    about 5 yrs ago  . Sleep apnea     Family History: Family History  Problem Relation Age of Onset  . Hypertension Father   . Stroke Father   . Hypertension Brother   . Stroke Brother   . Hyperlipidemia Brother   . Asthma Son     Social History   Socioeconomic History  . Marital status: Married    Spouse name: Not on file  . Number of children: Not on file  . Years of education: Not on file  . Highest education level: Not on file  Occupational History  . Not on file  Tobacco Use  . Smoking status: Former Research scientist (life sciences)  . Smokeless tobacco: Never Used  Vaping Use  . Vaping Use:  Never used  Substance and Sexual Activity  . Alcohol use: Yes    Comment: ocassionally  . Drug use: No  . Sexual activity: Yes  Other Topics Concern  . Not on file  Social History Narrative  . Not on file   Social Determinants of Health   Financial Resource Strain: Not on file  Food Insecurity: Not on file  Transportation Needs: Not on file  Physical Activity: Not on file  Stress: Not on file  Social Connections: Not on file  Intimate Partner Violence: Not on file      Review of Systems  Constitutional: Negative for chills, fatigue and unexpected weight change.       [place] weight loss since her last visit   HENT: Negative for congestion, postnasal drip, rhinorrhea, sneezing and sore throat.   Respiratory:  Negative for cough, chest tightness, shortness of breath and wheezing.   Cardiovascular: Negative for chest pain and palpitations.       Mildly elevated blood pressure today.   Gastrointestinal: Negative for abdominal pain, constipation, diarrhea, nausea and vomiting.  Endocrine: Negative for cold intolerance, polydipsia and polyuria.  Genitourinary: Negative for dysuria, frequency and urgency.  Musculoskeletal: Negative for arthralgias, back pain, joint swelling and neck pain.  Skin: Negative for rash.  Neurological: Negative.  Negative for tremors and numbness.  Hematological: Negative for adenopathy. Does not bruise/bleed easily.  Psychiatric/Behavioral: Negative for behavioral problems (Depression), sleep disturbance and suicidal ideas. The patient is not nervous/anxious.     Today's Vitals   09/28/20 1510  BP: (!) 156/80  Pulse: 80  Resp: 16  Temp: (!) 97.4 F (36.3 C)  SpO2: 95%  Weight: 261 lb 12.8 oz (118.8 kg)  Height: 5\' 7"  (1.702 m)   Body mass index is 41 kg/m.  Physical Exam Vitals and nursing note reviewed.  Constitutional:      General: She is not in acute distress.    Appearance: Normal appearance. She is well-developed. She is obese. She is not diaphoretic.  HENT:     Head: Normocephalic and atraumatic.     Mouth/Throat:     Pharynx: No oropharyngeal exudate.  Eyes:     Pupils: Pupils are equal, round, and reactive to light.  Neck:     Thyroid: No thyromegaly.     Vascular: No carotid bruit or JVD.     Trachea: No tracheal deviation.  Cardiovascular:     Rate and Rhythm: Normal rate and regular rhythm.     Heart sounds: Normal heart sounds. No murmur heard. No friction rub. No gallop.   Pulmonary:     Effort: Pulmonary effort is normal. No respiratory distress.     Breath sounds: Normal breath sounds. No wheezing or rales.  Chest:     Chest wall: No tenderness.  Abdominal:     Palpations: Abdomen is soft.  Musculoskeletal:        General: Normal  range of motion.     Cervical back: Normal range of motion and neck supple.  Lymphadenopathy:     Cervical: No cervical adenopathy.  Skin:    General: Skin is warm and dry.  Neurological:     General: No focal deficit present.     Mental Status: She is alert and oriented to person, place, and time.     Cranial Nerves: No cranial nerve deficit.  Psychiatric:        Mood and Affect: Mood normal.        Behavior: Behavior normal.  Thought Content: Thought content normal.        Judgment: Judgment normal.    Assessment/Plan: 1. Essential hypertension bp slightly elevated today but generally well managed. Continue norvasc and bisoprolol as prescribed. Refills provided today.  - amLODipine (NORVASC) 10 MG tablet; Take 1 tablet (10 mg total) by mouth daily.  Dispense: 90 tablet; Refill: 1 - bisoprolol (ZEBETA) 5 MG tablet; Take 1 tablet (5 mg total) by mouth daily.  Dispense: 90 tablet; Refill: 1  2. Impaired fasting glucose Trial rybelsus 3mg  daily. Continue with diet control and increase exercise.  - Semaglutide (RYBELSUS) 3 MG TABS; Take 3 mg by mouth daily.  Dispense: 30 tablet; Refill: 2  3. Hypokalemia Continue potassium 20MeQ twice dally.  - potassium chloride SA (KLOR-CON M20) 20 MEQ tablet; Take 1 tablet (20 mEq total) by mouth 2 (two) times daily.  Dispense: 180 tablet; Refill: 1  4. BMI 40.0-44.9, adult (HCC) Trial rybelsus 3mg  daily. Continue with diet control and increase exercise.  - Semaglutide (RYBELSUS) 3 MG TABS; Take 3 mg by mouth daily.  Dispense: 30 tablet; Refill: 2   General Counseling: Alissah verbalizes understanding of the findings of todays visit and agrees with plan of treatment. I have discussed any further diagnostic evaluation that may be needed or ordered today. We also reviewed her medications today. she has been encouraged to call the office with any questions or concerns that should arise related to todays visit.   Hypertension Counseling:    The following hypertensive lifestyle modification were recommended and discussed:  1. Limiting alcohol intake to less than 1 oz/day of ethanol:(24 oz of beer or 8 oz of wine or 2 oz of 100-proof whiskey). 2. Take baby ASA 81 mg daily. 3. Importance of regular aerobic exercise and losing weight. 4. Reduce dietary saturated fat and cholesterol intake for overall cardiovascular health. 5. Maintaining adequate dietary potassium, calcium, and magnesium intake. 6. Regular monitoring of the blood pressure. 7. Reduce sodium intake to less than 100 mmol/day (less than 2.3 gm of sodium or less than 6 gm of sodium choride)   This patient was seen by [facility] with Dr Lavera Guise as a part of collaborative care agreement  Meds ordered this encounter  Medications  . Semaglutide (RYBELSUS) 3 MG TABS    Sig: Take 3 mg by mouth daily.    Dispense:  30 tablet    Refill:  2    Order Specific Question:   Supervising Provider    Answer:   Lavera Guise [[id number]]  . amLODipine (NORVASC) 10 MG tablet    Sig: Take 1 tablet (10 mg total) by mouth daily.    Dispense:  90 tablet    Refill:  1    Order Specific Question:   Supervising Provider    Answer:   Lavera Guise [[id number]]  . bisoprolol (ZEBETA) 5 MG tablet    Sig: Take 1 tablet (5 mg total) by mouth daily.    Dispense:  90 tablet    Refill:  1    Order Specific Question:   Supervising Provider    Answer:   Lavera Guise [[id number]]  . potassium chloride SA (KLOR-CON M20) 20 MEQ tablet    Sig: Take 1 tablet (20 mEq total) by mouth 2 (two) times daily.    Dispense:  180 tablet    Refill:  1    Order Specific Question:   Supervising Provider    Answer:   Clayborn Bigness  M [[id number]]    Total time spent: 25 Minutes   Time spent includes review of chart, medications, test results, and follow up plan with the patient.      Dr Lavera Guise Internal medicine

## 2020-09-29 ENCOUNTER — Telehealth: Payer: Self-pay

## 2020-09-29 NOTE — Telephone Encounter (Signed)
PA approved for RYBELSUS 3 mg valid from 09/29/2020 to 09/29/2021 CaseID: [id number]

## 2020-10-04 NOTE — Telephone Encounter (Signed)
Orders placed.

## 2020-10-10 ENCOUNTER — Ambulatory Visit: Payer: Managed Care, Other (non HMO) | Admitting: Internal Medicine

## 2020-10-11 ENCOUNTER — Encounter
Admission: RE | Admit: 2020-10-11 | Discharge: 2020-10-11 | Disposition: A | Discharge status: Home / Self Care | Payer: Managed Care, Other (non HMO) | Source: Ambulatory Visit | Attending: Obstetrics and Gynecology | Admitting: Obstetrics and Gynecology

## 2020-10-11 ENCOUNTER — Other Ambulatory Visit: Payer: Self-pay

## 2020-10-11 DIAGNOSIS — I10 Essential (primary) hypertension: Secondary | ICD-10-CM | POA: Diagnosis not present

## 2020-10-11 DIAGNOSIS — Z01818 Encounter for other preprocedural examination: Secondary | ICD-10-CM | POA: Diagnosis present

## 2020-10-11 DIAGNOSIS — G4733 Obstructive sleep apnea (adult) (pediatric): Secondary | ICD-10-CM | POA: Diagnosis not present

## 2020-10-11 HISTORY — DX: Hyperlipidemia, unspecified: E78.5

## 2020-10-11 LAB — TYPE AND SCREEN
ABO/RH(D): O POS
Antibody Screen: NEGATIVE

## 2020-10-11 NOTE — Patient Instructions (Addendum)
Your procedure is scheduled on: 10/17/20 Report to [place]. To find out your arrival time please call [phone] between 1PM - 3PM on 10/16/20.  Remember: Instructions that are not followed completely may result in serious medical risk, up to and including death, or upon the discretion of your surgeon and anesthesiologist your surgery may need to be rescheduled.     _X__ 1. Do not eat food after midnight the night before your procedure.                 No gum chewing or hard candies. You may drink clear liquids up to 2 hours                 before you are scheduled to arrive for your surgery- DO not drink clear                 liquids within 2 hours of the start of your surgery.                 Clear Liquids include:  water, apple juice without pulp, clear carbohydrate                 drink such as Clearfast or Gatorade, Black Coffee or Tea (Do not add                 anything to coffee or tea). Diabetics water only  __X__2.  On the morning of surgery brush your teeth with toothpaste and water, you                 may rinse your mouth with mouthwash if you wish.  Do not swallow any              toothpaste of mouthwash.     _X__ 3.  No Alcohol for 24 hours before or after surgery.   _X__ 4.  Do Not Smoke or use e-cigarettes For 24 Hours Prior to Your Surgery.                 Do not use any chewable tobacco products for at least 6 hours prior to                 surgery.  ____  5.  Bring all medications with you on the day of surgery if instructed.   __X__  6.  Notify your doctor if there is any change in your medical condition      (cold, fever, infections).     Do not wear jewelry, make-up, hairpins, clips or nail polish. Do not wear lotions, powders, or perfumes.  Do not shave 48 hours prior to surgery. Men may shave face and neck. Do not bring valuables to the hospital.    [facility] is not responsible for any belongings or  valuables.  Contacts, dentures/partials or body piercings may not be worn into surgery. Bring a case for your contacts, glasses or hearing aids, a denture cup will be supplied. Leave your suitcase in the car. After surgery it may be brought to your room. For patients admitted to the hospital, discharge time is determined by your treatment team.   Patients discharged the day of surgery will not be allowed to drive home.   Please read over the following fact sheets that you were given:   MRSA Information  __X__ Take these medicines the morning of surgery with A SIP OF WATER:  1. amLODipine (NORVASC) 10 MG tablet  2. bisoprolol (ZEBETA) 5 MG tablet  3. rosuvastatin (CRESTOR) 5 MG tablet  4.  5.  6.  ____ Fleet Enema (as directed)   ____ Use CHG Soap/SAGE wipes as directed  ____ Use inhalers on the day of surgery  ____ Stop metformin/Janumet/Farxiga 2 days prior to surgery    ____ Take 1/2 of usual insulin dose the night before surgery. No insulin the morning          of surgery.   ____ Stop Blood Thinners Coumadin/Plavix/Xarelto/Pleta/Pradaxa/Eliquis/Effient/Aspirin  on   Or contact your Surgeon, Cardiologist or Medical Doctor regarding  ability to stop your blood thinners  __X__ Stop Anti-inflammatories 7 days before surgery such as Advil, Ibuprofen, Motrin,  BC or Goodies Powder, Naprosyn, Naproxen, Aleve, Aspirin   YOU MAY TAKE TYLENOL __X__ Stop all herbal supplements, fish oil or vitamin E until after surgery.    __X__ Bring C-Pap to the hospital.    ENSURE PRE SURGERY DRINK 2 HOURS BEFORE ARRIVING TO SURGERY

## 2020-10-13 ENCOUNTER — Other Ambulatory Visit: Payer: Self-pay

## 2020-10-13 ENCOUNTER — Other Ambulatory Visit
Admission: RE | Admit: 2020-10-13 | Discharge: 2020-10-13 | Disposition: A | Discharge status: Home / Self Care | Payer: Managed Care, Other (non HMO) | Source: Ambulatory Visit | Attending: Obstetrics and Gynecology | Admitting: Obstetrics and Gynecology

## 2020-10-13 DIAGNOSIS — Z20822 Contact with and (suspected) exposure to covid-19: Secondary | ICD-10-CM | POA: Diagnosis not present

## 2020-10-13 DIAGNOSIS — Z01812 Encounter for preprocedural laboratory examination: Secondary | ICD-10-CM | POA: Insufficient documentation

## 2020-10-14 LAB — SARS CORONAVIRUS 2 (TAT 6-24 HRS): SARS Coronavirus 2: NEGATIVE

## 2020-10-16 MED ORDER — ORAL CARE MOUTH RINSE: 15.0000 mL | Freq: Once | OROMUCOSAL | Status: AC

## 2020-10-16 MED ORDER — POVIDONE-IODINE 10 % EX SWAB: 2.0000 "application " | Freq: Once | CUTANEOUS | Status: DC

## 2020-10-16 MED ORDER — CHLORHEXIDINE GLUCONATE 0.12 % MT SOLN: 15.0000 mL | Freq: Once | OROMUCOSAL | Status: AC

## 2020-10-16 MED ORDER — FAMOTIDINE 20 MG PO TABS: 20.0000 mg | ORAL_TABLET | Freq: Once | ORAL | Status: AC

## 2020-10-17 ENCOUNTER — Ambulatory Visit: Payer: Managed Care, Other (non HMO)

## 2020-10-17 ENCOUNTER — Other Ambulatory Visit: Payer: Self-pay

## 2020-10-17 ENCOUNTER — Ambulatory Visit
Admission: RE | Admit: 2020-10-17 | Discharge: 2020-10-17 | Disposition: A | Discharge status: Home / Self Care | Payer: Managed Care, Other (non HMO) | Source: Ambulatory Visit | Attending: Obstetrics and Gynecology | Admitting: Obstetrics and Gynecology

## 2020-10-17 ENCOUNTER — Encounter
Admission: RE | Disposition: A | Discharge status: Home / Self Care | Payer: Self-pay | Source: Ambulatory Visit | Attending: Obstetrics and Gynecology

## 2020-10-17 ENCOUNTER — Encounter: Payer: Self-pay | Admitting: Obstetrics and Gynecology

## 2020-10-17 DIAGNOSIS — N840 Polyp of corpus uteri: Secondary | ICD-10-CM | POA: Diagnosis not present

## 2020-10-17 DIAGNOSIS — Z96652 Presence of left artificial knee joint: Secondary | ICD-10-CM | POA: Diagnosis not present

## 2020-10-17 DIAGNOSIS — D259 Leiomyoma of uterus, unspecified: Secondary | ICD-10-CM | POA: Diagnosis not present

## 2020-10-17 DIAGNOSIS — Z87891 Personal history of nicotine dependence: Secondary | ICD-10-CM | POA: Insufficient documentation

## 2020-10-17 DIAGNOSIS — N950 Postmenopausal bleeding: Secondary | ICD-10-CM | POA: Diagnosis not present

## 2020-10-17 HISTORY — PX: DILATATION & CURETTAGE/HYSTEROSCOPY WITH MYOSURE: SHX6511

## 2020-10-17 SURGERY — DILATATION & CURETTAGE/HYSTEROSCOPY WITH MYOSURE
Anesthesia: General

## 2020-10-17 MED ORDER — SUGAMMADEX SODIUM 200 MG/2ML IV SOLN
INTRAVENOUS | Status: DC | PRN
  Administered 2020-10-17: 200 mg via INTRAVENOUS

## 2020-10-17 MED ORDER — PROPOFOL 10 MG/ML IV BOLUS
INTRAVENOUS | Status: DC | PRN
  Administered 2020-10-17: 160 mg via INTRAVENOUS

## 2020-10-17 MED ORDER — FENTANYL CITRATE (PF) 100 MCG/2ML IJ SOLN
INTRAMUSCULAR | Status: AC
  Administered 2020-10-17: 25 ug via INTRAVENOUS
  Filled 2020-10-17: qty 2

## 2020-10-17 MED ORDER — SUCCINYLCHOLINE CHLORIDE 20 MG/ML IJ SOLN
INTRAMUSCULAR | Status: DC | PRN
  Administered 2020-10-17: 120 mg via INTRAVENOUS

## 2020-10-17 MED ORDER — IBUPROFEN 600 MG PO TABS
ORAL_TABLET | ORAL | Status: AC
  Filled 2020-10-17: qty 1

## 2020-10-17 MED ORDER — GLYCOPYRROLATE 0.2 MG/ML IJ SOLN
INTRAMUSCULAR | Status: AC
  Filled 2020-10-17: qty 1

## 2020-10-17 MED ORDER — FENTANYL CITRATE (PF) 100 MCG/2ML IJ SOLN
25.0000 ug | INTRAMUSCULAR | Status: DC | PRN
  Administered 2020-10-17 (×2): 25 ug via INTRAVENOUS

## 2020-10-17 MED ORDER — ONDANSETRON HCL 4 MG/2ML IJ SOLN: 4.0000 mg | Freq: Once | INTRAMUSCULAR | Status: DC | PRN

## 2020-10-17 MED ORDER — GLYCOPYRROLATE 0.2 MG/ML IJ SOLN
INTRAMUSCULAR | Status: DC | PRN
  Administered 2020-10-17: .2 mg via INTRAVENOUS

## 2020-10-17 MED ORDER — DEXAMETHASONE SODIUM PHOSPHATE 10 MG/ML IJ SOLN
INTRAMUSCULAR | Status: AC
  Filled 2020-10-17: qty 1

## 2020-10-17 MED ORDER — PHENYLEPHRINE HCL (PRESSORS) 10 MG/ML IV SOLN
INTRAVENOUS | Status: DC | PRN
  Administered 2020-10-17 (×2): 100 ug via INTRAVENOUS

## 2020-10-17 MED ORDER — FENTANYL CITRATE (PF) 100 MCG/2ML IJ SOLN
INTRAMUSCULAR | Status: DC | PRN
  Administered 2020-10-17: 50 ug via INTRAVENOUS

## 2020-10-17 MED ORDER — MIDAZOLAM HCL 2 MG/2ML IJ SOLN
INTRAMUSCULAR | Status: DC | PRN
  Administered 2020-10-17: 2 mg via INTRAVENOUS

## 2020-10-17 MED ORDER — MIDAZOLAM HCL 2 MG/2ML IJ SOLN
INTRAMUSCULAR | Status: AC
  Filled 2020-10-17: qty 2

## 2020-10-17 MED ORDER — ACETAMINOPHEN 500 MG PO TABS: 1000.0000 mg | ORAL_TABLET | Freq: Four times a day (QID) | ORAL | 0 refills | Status: AC | PRN

## 2020-10-17 MED ORDER — ONDANSETRON HCL 4 MG/2ML IJ SOLN
INTRAMUSCULAR | Status: AC
  Filled 2020-10-17: qty 2

## 2020-10-17 MED ORDER — CHLORHEXIDINE GLUCONATE 0.12 % MT SOLN
OROMUCOSAL | Status: AC
  Administered 2020-10-17: 15 mL via OROMUCOSAL
  Filled 2020-10-17: qty 15

## 2020-10-17 MED ORDER — IBUPROFEN 600 MG PO TABS: 600.0000 mg | ORAL_TABLET | Freq: Four times a day (QID) | ORAL | 0 refills | Status: DC | PRN

## 2020-10-17 MED ORDER — LACTATED RINGERS IV SOLN: INTRAVENOUS | Status: DC

## 2020-10-17 MED ORDER — ROCURONIUM BROMIDE 100 MG/10ML IV SOLN
INTRAVENOUS | Status: DC | PRN
  Administered 2020-10-17: 5 mg via INTRAVENOUS
  Administered 2020-10-17: 25 mg via INTRAVENOUS

## 2020-10-17 MED ORDER — ONDANSETRON HCL 4 MG/2ML IJ SOLN
INTRAMUSCULAR | Status: DC | PRN
  Administered 2020-10-17: 4 mg via INTRAVENOUS

## 2020-10-17 MED ORDER — DEXAMETHASONE SODIUM PHOSPHATE 10 MG/ML IJ SOLN
INTRAMUSCULAR | Status: DC | PRN
  Administered 2020-10-17: 10 mg via INTRAVENOUS

## 2020-10-17 MED ORDER — LIDOCAINE HCL (CARDIAC) PF 100 MG/5ML IV SOSY
PREFILLED_SYRINGE | INTRAVENOUS | Status: DC | PRN
  Administered 2020-10-17: 100 mg via INTRAVENOUS

## 2020-10-17 MED ORDER — LIDOCAINE HCL (PF) 2 % IJ SOLN
INTRAMUSCULAR | Status: AC
  Filled 2020-10-17: qty 5

## 2020-10-17 MED ORDER — PROPOFOL 10 MG/ML IV BOLUS
INTRAVENOUS | Status: AC
  Filled 2020-10-17: qty 20

## 2020-10-17 MED ORDER — SILVER NITRATE-POT NITRATE 75-25 % EX MISC
CUTANEOUS | Status: AC
  Filled 2020-10-17: qty 10

## 2020-10-17 MED ORDER — IBUPROFEN 600 MG PO TABS
600.0000 mg | ORAL_TABLET | Freq: Once | ORAL | Status: AC
  Administered 2020-10-17: 600 mg via ORAL
  Filled 2020-10-17: qty 1

## 2020-10-17 MED ORDER — FENTANYL CITRATE (PF) 100 MCG/2ML IJ SOLN
INTRAMUSCULAR | Status: AC
  Filled 2020-10-17: qty 2

## 2020-10-17 MED ORDER — ROCURONIUM BROMIDE 10 MG/ML (PF) SYRINGE
PREFILLED_SYRINGE | INTRAVENOUS | Status: AC
  Filled 2020-10-17: qty 10

## 2020-10-17 MED ORDER — FAMOTIDINE 20 MG PO TABS
ORAL_TABLET | ORAL | Status: AC
  Administered 2020-10-17: 20 mg via ORAL
  Filled 2020-10-17: qty 1

## 2020-10-17 SURGICAL SUPPLY — 20 items
CATH ROBINSON RED A/P 16FR (CATHETERS) ×3 IMPLANT
DEVICE MYOSURE LITE (MISCELLANEOUS) IMPLANT
DEVICE MYOSURE REACH (MISCELLANEOUS) ×3 IMPLANT
ELECT REM PT RETURN 9FT ADLT (ELECTROSURGICAL)
ELECTRODE REM PT RTRN 9FT ADLT (ELECTROSURGICAL) IMPLANT
GAUZE 4X4 16PLY RFD (DISPOSABLE) ×3 IMPLANT
GLOVE SURG SYN 6.5 ES PF (GLOVE) ×3 IMPLANT
GLOVE SURG UNDER POLY LF SZ6.5 (GLOVE) ×6 IMPLANT
GOWN STRL REUS W/ TWL LRG LVL3 (GOWN DISPOSABLE) ×4 IMPLANT
GOWN STRL REUS W/TWL LRG LVL3 (GOWN DISPOSABLE) ×6
KIT PROCEDURE FLUENT (KITS) ×3 IMPLANT
MANIFOLD NEPTUNE II (INSTRUMENTS) IMPLANT
PACK DNC HYST (MISCELLANEOUS) ×3 IMPLANT
PAD OB MATERNITY 4.3X12.25 (PERSONAL CARE ITEMS) ×3 IMPLANT
PAD PREP 24X41 OB/GYN DISP (PERSONAL CARE ITEMS) ×3 IMPLANT
SEAL ROD LENS SCOPE MYOSURE (ABLATOR) ×3 IMPLANT
SOL .9 NS 3000ML IRR  AL (IV SOLUTION) ×1
SOL .9 NS 3000ML IRR AL (IV SOLUTION) ×2
SOL .9 NS 3000ML IRR UROMATIC (IV SOLUTION) ×2 IMPLANT
TOWEL OR 17X26 4PK STRL BLUE (TOWEL DISPOSABLE) ×3 IMPLANT

## 2020-10-17 NOTE — Anesthesia Procedure Notes (Cosign Needed)
Procedure Name: Intubation Date/Time: 10/17/2020 2:06 PM Performed by: Alvin Critchley, MD Pre-anesthesia Checklist: Patient identified, Emergency Drugs available, Suction available and Patient being monitored Patient Re-evaluated:Patient Re-evaluated prior to induction Oxygen Delivery Method: Circle system utilized Preoxygenation: Pre-oxygenation with 100% oxygen Induction Type: IV induction Ventilation: Oral airway inserted - appropriate to patient size and Two handed mask ventilation required Laryngoscope Size: McGraph and 3 Grade View: Grade I Tube type: Oral Tube size: 7.0 mm Number of attempts: 1 Airway Equipment and Method: Stylet Placement Confirmation: ETT inserted through vocal cords under direct vision,  positive ETCO2 and breath sounds checked- equal and bilateral Secured at: 21 cm Tube secured with: Tape Dental Injury: Teeth and Oropharynx as per pre-operative assessment

## 2020-10-17 NOTE — Op Note (Signed)
Operative Note  10/17/2020  PRE-OP DIAGNOSIS: Postmenopausal bleeding, fibroid uterus  POST-OP DIAGNOSIS: Postmenopausal bleeding, fibroid uterus, endometrial polyp  SURGEON: Sammie Denner MD  PROCEDURE: Procedure(s): DILATATION & CURETTAGE/HYSTEROSCOPY WITH MYOSURE POLYPECTOMY   ANESTHESIA: Choice   ESTIMATED BLOOD LOSS: 10 cc   SPECIMENS:  Endometrial polyp and curettings  FLUID DEFICIT: 0 cc  COMPLICATIONS: None  DISPOSITION: PACU - hemodynamically stable.  CONDITION: stable  FINDINGS: Exam under anesthesia revealed  20 cm uterus with bilateral adnexa without masses or fullness. Hysteroscopy revealed normal uterine cavity with endometrial polyp and normal bilateral tubal ostia and normal appearing endocervical canal.  PROCEDURE IN DETAIL: After informed consent was obtained, the patient was taken to the operating room where anesthesia was obtained without difficulty. The patient was positioned in the dorsal lithotomy position in [place]. The patient's bladder was catheterized with an in and out foley catheter. The patient was examined under anesthesia, with the above noted findings. The weightedspeculum was placed inside the patient's vagina, and the the anterior lip of the cervix was seen and grasped with the tenaculum.  The uterine cavity was sounded to 10 cm, and then the cervix was progressively dilated to a 18French-Pratt dilator. The 0 degree hysteroscope was introduced, with saline fluid used to distend the intrauterine cavity, with the above noted findings.  The Myosure was used to remove the uterine polyp. Once the cavity was sampled entirely the hysteroscope was removed.   The uterine cavity was curetted until a gritty texture was noted, yielding endometrial curettings. Excellent hemostasis was noted, and all instruments were removed, with excellent hemostasis noted throughout. She was then taken out of dorsal lithotomy. Minimal discrepancy in fluid was  noted.  The patient tolerated the procedure well. Sponge, lap and needle counts were correct x2. The patient was taken to recovery room in excellent condition.  Adrian Prows MD [facility], [facility] 10/17/2020 3:43 PM

## 2020-10-17 NOTE — Transfer of Care (Signed)
Immediate Anesthesia Transfer of Care Note  Patient: Jill Banks  Procedure(s) Performed: [place] POLYPECTOMY  Patient Location: PACU  Anesthesia Type:General  Level of Consciousness: awake, alert  and oriented  Airway & Oxygen Therapy: Patient Spontanous Breathing and Patient connected to nasal cannula oxygen  Post-op Assessment: Report given to RN and Post -op Vital signs reviewed and stable  Post vital signs: Reviewed and stable  Last Vitals:  Vitals Value Taken Time  BP 155/81 10/17/20 1515  Temp 36.2 C 10/17/20 1515  Pulse 79 10/17/20 1519  Resp 19 10/17/20 1519  SpO2 100 % 10/17/20 1519  Vitals shown include unvalidated device data.  Last Pain:  Vitals:   10/17/20 1515  PainSc: Asleep         Complications: No complications documented.

## 2020-10-17 NOTE — Anesthesia Preprocedure Evaluation (Signed)
Anesthesia Evaluation  Patient identified by MRN, date of birth, ID band Patient awake    Reviewed: Allergy & Precautions, NPO status , Patient's Chart, lab work & pertinent test results, reviewed documented beta blocker date and time   History of Anesthesia Complications Negative for: history of anesthetic complications  Airway Mallampati: II  TM Distance: >3 FB     Dental  (+) Teeth Intact   Pulmonary sleep apnea and Continuous Positive Airway Pressure Ventilation , neg COPD, Not current smoker, former smoker,    Pulmonary exam normal        Cardiovascular hypertension, Pt. on medications (-) Past MI and (-) CHF Normal cardiovascular exam(-) dysrhythmias (-) Valvular Problems/Murmurs     Neuro/Psych  Headaches, neg Seizures Anxiety    GI/Hepatic negative GI ROS, Neg liver ROS, neg GERD  ,  Endo/Other  negative endocrine ROSneg diabetes  Renal/GU negative Renal ROS  negative genitourinary   Musculoskeletal  (+) Arthritis ,   Abdominal (+) + obese,   Peds negative pediatric ROS (+)  Hematology  (+) anemia ,   Anesthesia Other Findings Past Medical History: No date: Anxiety No date: Arthritis No date: Bruises easily 04/2019: COVID-19 No date: Headache No date: HLD (hyperlipidemia) No date: Hypertension No date: Leg pain No date: Pneumonia     Comment:  about 5 yrs ago No date: Sleep apnea  Reproductive/Obstetrics                             Anesthesia Physical  Anesthesia Plan  ASA: III  Anesthesia Plan: General   Post-op Pain Management:    Induction: Intravenous  PONV Risk Score and Plan:   Airway Management Planned: Oral ETT  Additional Equipment:   Intra-op Plan:   Post-operative Plan: Extubation in OR  Informed Consent: I have reviewed the patients History and Physical, chart, labs and discussed the procedure including the risks, benefits and alternatives for  the proposed anesthesia with the patient or authorized representative who has indicated his/her understanding and acceptance.     Dental advisory given  Plan Discussed with: CRNA and Surgeon  Anesthesia Plan Comments:         Anesthesia Quick Evaluation

## 2020-10-17 NOTE — Discharge Instructions (Signed)
AMBULATORY SURGERY  DISCHARGE INSTRUCTIONS   1) The drugs that you were given will stay in your system until tomorrow so for the next 24 hours you should not:  A) Drive an automobile B) Make any legal decisions C) Drink any alcoholic beverage   2) You may resume regular meals tomorrow.  Today it is better to start with liquids and gradually work up to solid foods.  You may eat anything you prefer, but it is better to start with liquids, then soup and crackers, and gradually work up to solid foods.   3) Please notify your doctor immediately if you have any unusual bleeding, trouble breathing, redness and pain at the surgery site, drainage, fever, or pain not relieved by medication.    4) Additional Instructions:        Please contact your physician with any problems or Same Day Surgery at [phone], Monday through Friday 6 am to 4 pm, or [place] [facility] number at [phone].   Hysteroscopy Hysteroscopy is a procedure used to look inside a woman's womb (uterus). This may be done for various reasons, including:  To look for tumors and other growths in the uterus.  To evaluate abnormal bleeding, fibroid tumors, polyps, scar tissue, or uterine cancer.  To determine why a woman is unable to get pregnant or has had repeated pregnancy losses.  To locate an IUD (intrauterine device).  To place a birth control device into the fallopian tubes. During this procedure, a thin, flexible tube with a small light and camera (hysteroscope) is used to examine the uterus. The camera sends images to a monitor in the room so that your health care provider can view the inside of your uterus. A hysteroscopy should be done right after a menstrual period. Tell a health care provider about:  Any allergies you have.  All medicines you are taking, including vitamins, herbs, eye drops, creams, and over-the-counter medicines.  Any problems you or family members have had with  anesthetic medicines.  Any blood disorders you have.  Any surgeries you have had.  Any medical conditions you have.  Whether you are pregnant or may be pregnant.  Whether you have been diagnosed with an STI (sexually transmitted infection) or you think you have an STI. What are the risks? Generally, this is a safe procedure. However, problems may occur, including:  Excessive bleeding.  Infection.  Damage to the uterus or other structures or organs.  Allergic reaction to medicines or fluids that are used in the procedure. What happens before the procedure? Staying hydrated Follow instructions from your health care provider about hydration, which may include:  Up to 2 hours before the procedure - you may continue to drink clear liquids, such as water, clear fruit juice, black coffee, and plain tea. Eating and drinking restrictions Follow instructions from your health care provider about eating and drinking, which may include:  8 hours before the procedure - stop eating solid foods and drink clear liquids only.  2 hours before the procedure - stop drinking clear liquids. Medicines  Ask your health care provider about: ? Changing or stopping your regular medicines. This is especially important if you are taking diabetes medicines or blood thinners. ? Taking medicines such as aspirin and ibuprofen. These medicines can thin your blood. Do not take these medicines unless your health care provider tells you to take them. ? Taking over-the-counter medicines, vitamins, herbs, and supplements.  Medicine may be placed in your cervix the day before  the procedure. This medicine causes the cervix to open (dilate). The larger opening makes it easier for the hysteroscope to be inserted into the uterus during the procedure. General instructions  Ask your health care provider: ? What steps will be taken to help prevent infection. These steps may include:  Washing skin with a germ-killing  soap.  Taking antibiotic medicine.  Do not use any products that contain nicotine or tobacco for at least 4 weeks before the procedure. These products include cigarettes, chewing tobacco, and vaping devices, such as e-cigarettes. If you need help quitting, ask your health care provider.  Plan to have a responsible adult take you home from the hospital or clinic.  Plan to have a responsible adult care for you for the time you are told after you leave the hospital or clinic. This is important.  Empty your bladder before the procedure begins. What happens during the procedure?  An IV will be inserted into one of your veins.  You may be given: ? A medicine to help you relax (sedative). ? A medicine that numbs the area around the cervix (local anesthetic). ? A medicine to make you fall asleep (general anesthetic).  A hysteroscope will be inserted through your vagina and into your uterus.  Air or fluid will be used to enlarge your uterus to allow your health care provider to see it better. The amount of fluid used will be carefully checked throughout the procedure.  In some cases, tissue may be gently scraped from inside the uterus and sent to a lab for testing (biopsy). The procedure may vary among health care providers and hospitals. What can I expect after the procedure?  Your blood pressure, heart rate, breathing rate, and blood oxygen level will be monitored until you leave the hospital or clinic.  You may have cramps. You may be given medicines for this.  You may have bleeding, which may vary from light spotting to menstrual-like bleeding. This is normal.  If you had a biopsy, it is up to you to get the results. Ask your health care provider, or the department that is doing the procedure, when your results will be ready. Follow these instructions at home: Activity  Rest as told by your health care provider.  Return to your normal activities as told by your health care  provider. Ask your health care provider what activities are safe for you.  If you were given a sedative during the procedure, it can affect you for several hours. Do not drive or operate machinery until your health care provider says that it is safe. Medicines  Do not take aspirin or other NSAIDs during recovery, as told by your healthcare provider. It can increase the risk of bleeding.  Ask your health care provider if the medicine prescribed to you: ? Requires you to avoid driving or using machinery. ? Can cause constipation. You may need to take these actions to prevent or treat constipation:  Drink enough fluid to keep your urine pale yellow.  Take over-the-counter or prescription medicines.  Eat foods that are high in fiber, such as beans, whole grains, and fresh fruits and vegetables.  Limit foods that are high in fat and processed sugars, such as fried or sweet foods. General instructions  Do not douche, use tampons, or have sex for 2 weeks after the procedure, or until your health care provider approves.  Do not take baths, swim, or use a hot tub until your health care provider approves. Take showers  instead of baths for 2 weeks, or for as long as told by your health care provider.  Keep all follow-up visits. This is important. Contact a health care provider if:  You feel dizzy or lightheaded.  You feel nauseous.  You have abnormal vaginal discharge.  You have a rash.  You have pain that does not get better with medicine.  You have chills. Get help right away if:  You have bleeding that is heavier than a normal menstrual period.  You have a fever.  You have pain or cramps that get worse.  You develop new abdominal pain.  You faint.  You have pain in your shoulder.  You are short of breath. Summary  Hysteroscopy is a procedure that is used to look inside a woman's womb (uterus).  After the procedure, you may have bleeding, which varies from light  spotting to menstrual-like bleeding. This is normal. You may also have cramps.  Do not douche, use tampons, or have sex for 2 weeks after the procedure, or until your health care provider approves.  Plan to have a responsible adult take you home from the hospital or clinic. This information is not intended to replace advice given to you by your health care provider. Make sure you discuss any questions you have with your health care provider. Document Revised: 04/19/2020 Document Reviewed: 04/19/2020 Elsevier Patient Education  2021 [employer].

## 2020-10-17 NOTE — H&P (Signed)
@LOGO @  Patient ID: Jill Banks, female   DOB: 03-Aug-1956, 65 y.o.   MRN: [id number]  Reason for Consult: No chief complaint on file.   Referred by No ref. provider found  Subjective:     HPI:  Jill Banks is a 65 y.o. female. She had an enlarged fibroid uterus noted recently on a abdominal CT and pelvic US. She was referred here for consultation regarding fibroid uterus. She reports a history of menorrhagia but says that this resolved. She does report that usually once a year in August or October she will have vaginal bleeding. This has been going on for several years. Generally light, but enough to require wearing a pad. She has some pain with intercourse. She denies pelvic pain, pressure, or urinary complaints related to the enlarged uterus.    Past Medical History:  Diagnosis Date  . Anxiety   . Arthritis   . Bruises easily   . COVID-19 04/2019  . Headache   . HLD (hyperlipidemia)   . Hypertension   . Leg pain   . Pneumonia    about 5 yrs ago  . Sleep apnea    Family History  Problem Relation Age of Onset  . Hypertension Father   . Stroke Father   . Hypertension Brother   . Stroke Brother   . Hyperlipidemia Brother   . Asthma Son    Past Surgical History:  Procedure Laterality Date  . APPLICATION OF WOUND VAC 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   Procedure: APPLICATION OF WOUND VAC;  Surgeon: Hessie Knows, MD;  Location: [facility] ORS;  Service: Orthopedics;  Laterality: Left;  #[id number]  . DILATION AND CURETTAGE OF UTERUS     x 3  . TOE SURGERY Left    second toe  . 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 Smoking status: Former Research scientist (life sciences)  . Smokeless tobacco: Never Used  Substance Use Topics  . Alcohol use: Yes    Comment: ocassionally    No Known Allergies  Current  Facility-Administered Medications  Medication Dose Route Frequency Provider Last Rate Last Admin  . lactated ringers infusion   Intravenous Continuous Homero Fellers, MD 125 mL/hr at 10/17/20 1242 New Bag at 10/17/20 1242  . povidone-iodine 10 % swab 2 application  2 application Topical Once Wilmina Maxham R, MD        Review of Systems  Constitutional: Negative for chills, fatigue, fever and unexpected weight change.  HENT: Negative for trouble swallowing.  Eyes: Negative for loss of vision.  Respiratory: Negative for cough, shortness of breath and wheezing.  Cardiovascular: Negative for chest pain, leg swelling, palpitations and syncope.  GI: Negative for abdominal pain, blood in stool, diarrhea, nausea and vomiting.  GU: Negative for difficulty urinating, dysuria, frequency and hematuria.  Musculoskeletal: Negative for back pain, leg pain and joint pain.  Skin: Negative for rash.  Neurological: Negative for dizziness, headaches, light-headedness, numbness and seizures.  Psychiatric: Negative for behavioral problem, confusion, depressed mood and sleep disturbance.        Objective:  Objective   Vitals:   10/17/20 1221  BP: (!) 174/81  Pulse: 74  Resp: 18  Temp: 98.5 F (36.9 C)  SpO2: 100%  Weight: 114.3 kg  Height: 5\' 7"  (1.702 m)   Body mass index is 39.47  kg/m.  Physical Exam Vitals and nursing note reviewed.  Constitutional:      Appearance: She is well-developed and well-nourished.  HENT:     Head: Normocephalic and atraumatic.  Eyes:     Extraocular Movements: EOM normal.     Pupils: Pupils are equal, round, and reactive to light.  Cardiovascular:     Rate and Rhythm: Normal rate and regular rhythm.  Pulmonary:     Effort: Pulmonary effort is normal. No respiratory distress.  Skin:    General: Skin is warm and dry.  Neurological:     Mental Status: She is alert and oriented to person, place, and time.  Psychiatric:        Mood and Affect:  Mood and affect normal.        Behavior: Behavior normal.        Thought Content: Thought content normal.        Judgment: Judgment normal.     Assessment/Plan:     65 yo with enlarged fibroid uterus and postmenopausal bleeding.  1. Reviewed option for fibroid management. She is not bothered by her enlarged fibroid uterus and desires no intervention at this time.   2. Will plan hysteroscopy D&C for postmenopausal bleeding. Patient declines office biopsy today. Has had a EMB in the past and it was very painful.   More than 30 minutes were spent face to face with the patient in the room, reviewing the medical record, labs and images, and coordinating care for the patient. The plan of management was discussed in detail and counseling was provided.      Adrian Prows MD [facility] 10/17/2020 1:43 PM

## 2020-10-18 ENCOUNTER — Encounter: Payer: Self-pay | Admitting: Obstetrics and Gynecology

## 2020-10-19 LAB — SURGICAL PATHOLOGY

## 2020-10-20 NOTE — Anesthesia Postprocedure Evaluation (Signed)
Anesthesia Post Note  Patient: Jill Banks  Procedure(s) Performed: [place] POLYPECTOMY  Patient location during evaluation: PACU Anesthesia Type: General Level of consciousness: awake and alert and oriented Pain management: pain level controlled Vital Signs Assessment: post-procedure vital signs reviewed and stable Respiratory status: spontaneous breathing Cardiovascular status: blood pressure returned to baseline Anesthetic complications: no   No complications documented.   Last Vitals:  Vitals:   10/17/20 1557 10/17/20 1615  BP:  (!) 151/76  Pulse:  70  Resp:  16  Temp: (!) 36.3 C (!) 36.1 C  SpO2:  96%    Last Pain:  Vitals:   10/18/20 0855  TempSrc:   PainSc: 4                  Lael Wetherbee

## 2020-10-24 ENCOUNTER — Encounter: Payer: Self-pay | Admitting: Internal Medicine

## 2020-10-24 ENCOUNTER — Ambulatory Visit: Payer: Managed Care, Other (non HMO) | Admitting: Internal Medicine

## 2020-10-24 VITALS — BP 120/70 | HR 64 | Temp 97.9°F | Resp 16 | Ht 67.0 in | Wt 261.0 lb

## 2020-10-24 DIAGNOSIS — G4733 Obstructive sleep apnea (adult) (pediatric): Secondary | ICD-10-CM | POA: Diagnosis not present

## 2020-10-24 DIAGNOSIS — Z6841 Body Mass Index (BMI) 40.0 and over, adult: Secondary | ICD-10-CM | POA: Diagnosis not present

## 2020-10-24 DIAGNOSIS — Z8616 Personal history of COVID-19: Secondary | ICD-10-CM | POA: Diagnosis not present

## 2020-10-24 DIAGNOSIS — Z7189 Other specified counseling: Secondary | ICD-10-CM | POA: Diagnosis not present

## 2020-10-24 NOTE — Progress Notes (Signed)
[facility] [place],  [id number]  Pulmonary Sleep Medicine   Office Visit Note  Patient Name: Jill Banks DOB: [birth year] MRN [id number]  Date of Service: 11/01/2020  Complaints/HPI: OSA on CPAP seems to be doing okay on the CPAP therapy discuss the mask issues.  Discussed mask fit discuss proper cleaning of the machine.  On she is good compliance.  She is trying acute per mask and machine up to date in terms of supplies.  ROS  General: (-) fever, (-) chills, (-) night sweats, (-) weakness Skin: (-) rashes, (-) itching,. Eyes: (-) visual changes, (-) redness, (-) itching. Nose and Sinuses: (-) nasal stuffiness or itchiness, (-) postnasal drip, (-) nosebleeds, (-) sinus trouble. Mouth and Throat: (-) sore throat, (-) hoarseness. Neck: (-) swollen glands, (-) enlarged thyroid, (-) neck pain. Respiratory: + cough, (-) bloody sputum, + shortness of breath, + wheezing. Cardiovascular: - ankle swelling, (-) chest pain. Lymphatic: (-) lymph node enlargement. Neurologic: (-) numbness, (-) tingling. Psychiatric: (-) anxiety, (-) depression   Current Medication: Outpatient Encounter Medications as of 10/24/2020  Medication Sig Note  . acetaminophen (TYLENOL) 500 MG tablet Take 2 tablets (1,000 mg total) by mouth every 6 (six) hours as needed.   Marland Kitchen amLODipine (NORVASC) 10 MG tablet Take 1 tablet (10 mg total) by mouth daily.   . bisoprolol (ZEBETA) 5 MG tablet Take 1 tablet (5 mg total) by mouth daily.   . calcitRIOL (ROCALTROL) 0.25 MCG capsule Take 1 capsule (0.25 mcg total) by mouth daily.   . Cyanocobalamin (B-12 PO) Take 1 tablet by mouth once a week.   . diclofenac sodium (VOLTAREN) 1 % GEL Apply 2 g topically 4 (four) times daily as needed (knee pain).   . hydrochlorothiazide (HYDRODIURIL) 25 MG tablet Take 1 tablet (25 mg total) by mouth daily.   Marland Kitchen ibuprofen (ADVIL) 600 MG tablet Take 1 tablet (600 mg total) by mouth every 6 (six) hours as needed.    . naproxen sodium (ALEVE) 220 MG tablet Take 440 mg by mouth 2 (two) times daily as needed (pain).   . NON FORMULARY cpap device   . potassium chloride SA (KLOR-CON M20) 20 MEQ tablet Take 1 tablet (20 mEq total) by mouth 2 (two) times daily.   . rosuvastatin (CRESTOR) 5 MG tablet Take 1 tablet (5 mg total) by mouth daily.   . Semaglutide (RYBELSUS) 3 MG TABS Take 3 mg by mouth daily. 10/03/2020: Hasnt started   No facility-administered encounter medications on file as of 10/24/2020.    Surgical History: Past Surgical History:  Procedure Laterality Date  . APPLICATION OF WOUND VAC 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   Procedure: APPLICATION OF WOUND VAC;  Surgeon: Hessie Knows, MD;  Location: [facility] ORS;  Service: Orthopedics;  Laterality: Left;  #[id number]  . DILATATION & CURETTAGE/HYSTEROSCOPY WITH MYOSURE  10/17/2020   Procedure: [place] POLYPECTOMY;  Surgeon: Homero Fellers, MD;  Location: [facility] ORS;  Service: Gynecology;;  . DILATION AND CURETTAGE OF UTERUS     x 3  . TOE SURGERY Left    second toe  . 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 Anxiety   . Arthritis   . Bruises easily   . COVID-19 04/2019  . Headache   . HLD (hyperlipidemia)   .  Hypertension   . Leg pain   . Pneumonia    about 5 yrs ago  . Sleep apnea     Family History: Family History  Problem Relation Age of Onset  . Hypertension Father   . Stroke Father   . Hypertension Brother   . Stroke Brother   . Hyperlipidemia Brother   . Asthma Son     Social History: Social History   Socioeconomic History  . Marital status: Married    Spouse name: Not on file  . Number of children: Not on file  . Years of education: Not on file  . Highest education level: Not on file  Occupational  History  . Not on file  Tobacco Use  . Smoking status: Former Research scientist (life sciences)  . Smokeless tobacco: Never Used  Vaping Use  . Vaping Use: Never used  Substance and Sexual Activity  . Alcohol use: Yes    Comment: ocassionally  . Drug use: No  . Sexual activity: Yes  Other Topics Concern  . Not on file  Social History Narrative  . Not on file   Social Determinants of Health   Financial Resource Strain: Not on file  Food Insecurity: Not on file  Transportation Needs: Not on file  Physical Activity: Not on file  Stress: Not on file  Social Connections: Not on file  Intimate Partner Violence: Not on file    Vital Signs: Blood pressure 120/70, pulse 64, temperature 97.9 F (36.6 C), resp. rate 16, height 5\' 7"  (1.702 m), weight 261 lb (118.4 kg), SpO2 98 %.  Examination: General Appearance: The patient is well-developed, well-nourished, and in no distress. Skin: Gross inspection of skin unremarkable. Head: normocephalic, no gross deformities. Eyes: no gross deformities noted. ENT: ears appear grossly normal no exudates. Neck: Supple. No thyromegaly. No LAD. Respiratory: no rhonchi noted. Cardiovascular: Normal S1 and S2 without murmur or rub. Extremities: No cyanosis. pulses are equal. Neurologic: Alert and oriented. No involuntary movements.  LABS: Recent Results (from the past 2160 hour(s))  Type and screen [place]     Status: None   Collection Time: 10/11/20 10:33 AM  Result Value Ref Range   ABO/RH(D) O POS    Antibody Screen NEG    Sample Expiration 10/25/2020,2359    Extend sample reason      NO TRANSFUSIONS OR PREGNANCY IN THE PAST 3 MONTHS Performed at [facility], [address] [place], [place] [address]   SARS CORONAVIRUS 2 (TAT 6-24 HRS) Nasopharyngeal Nasopharyngeal Swab     Status: None   Collection Time: 10/13/20  1:10 PM   Specimen: Nasopharyngeal Swab  Result Value Ref Range   SARS Coronavirus 2 NEGATIVE NEGATIVE     Comment: (NOTE) SARS-CoV-2 target nucleic acids are NOT DETECTED.  The SARS-CoV-2 RNA is generally detectable in upper and lower respiratory specimens during the acute phase of infection. Negative results do not preclude SARS-CoV-2 infection, do not rule out co-infections with other pathogens, and should not be used as the sole basis for treatment or other patient management decisions. Negative results must be combined with clinical observations, patient history, and epidemiological information. The expected result is Negative.  Fact Sheet for Patients: SugarRoll.be  Fact Sheet for Healthcare Providers: https://www.woods-mathews.com/  This test is not yet approved or cleared by the [place] FDA and  has been authorized for detection and/or diagnosis of SARS-CoV-2 by FDA under an Emergency Use Authorization (EUA). This EUA will remain  in effect (meaning this  test can be used) for the duration of the COVID-19 declaration under Se ction 564(b)(1) of the Act, 21 U.S.C. section 360bbb-3(b)(1), unless the authorization is terminated or revoked sooner.  Performed at [facility], [place] [address], [place], [place] [address]   Surgical pathology     Status: None   Collection Time: 10/17/20  2:21 PM  Result Value Ref Range   SURGICAL PATHOLOGY      SURGICAL PATHOLOGY CASE: [id number] PATIENT: Liane Lukehart Surgical Pathology Report     Specimen Submitted: A. Endometrial curettings, and polyp  Clinical History: Postmenopausal bleeding    DIAGNOSIS: A. ENDOMETRIUM; CURETTAGE AND POLYPECTOMY: - FRAGMENTS OF BENIGN ENDOMETRIAL POLYP AND ASSOCIATED DISORDERED PROLIFERATIVE ENDOMETRIUM. - NEGATIVE FOR ATYPICAL HYPERPLASIA/EIN AND MALIGNANCY.  GROSS DESCRIPTION: A. Labeled: Endometrial curettings and polyp Received: In formalin Collection time: 2:54 PM on 10/17/2020 Placed into formalin time: 2:58 PM on 10/17/2020 Tissue  fragment(s): Multiple Size: Aggregate, 6.0 x 3.0 x 1.2 cm Description: Multiple fragments pink-tan firm tissue and red-tan soft and hemorrhagic material. Entirely submitted in cassettes 1 through 11.  Final Diagnosis performed by Allena Napoleon, MD.   Electronically signed 10/19/2020 4:00:32PM The electronic signature indicates that the named Attending Pathologist has evaluated the specimen  Technical component performed at [facility], [address], [place], [place] [address] Lab: [phone] Dir: Rush Farmer, MD, MMM  Professional component performed at [facility], [facility], [place], [place], [place] [address] Lab: [phone] Dir: Dellia Nims. Reuel Derby, MD     Radiology: No results found.  No results found.  No results found.    Assessment and Plan: Patient Active Problem List   Diagnosis Date Noted  . Endometrial polyp   . Postmenopausal bleeding   . Uterine enlargement 09/17/2020  . BMI 40.0-44.9, adult ([place]) 09/17/2020  . Uterine leiomyoma 08/26/2020  . Aortic atherosclerosis ([place]) 08/26/2020  . Diastasis of rectus abdominis 08/08/2020  . Encounter for health maintenance examination with abnormal findings 07/05/2020  . Bone spur of right ankle 07/05/2020  . S/P TKR (total knee replacement) using cement, left 07/27/2019  . Encounter for general adult medical examination with abnormal findings 07/01/2019  . Adjustment insomnia 07/01/2019  . Encounter for screening mammogram for malignant neoplasm of breast 07/01/2019  . Routine cervical smear 07/01/2019  . Ventral hernia without obstruction or gangrene 07/01/2019  . Pain and swelling of ankle, right 01/13/2019  . Acute vaginitis 01/11/2018  . Bacterial vaginitis 12/18/2017  . Inflammatory polyarthritis ([place]) 12/18/2017  . Impaired fasting glucose 09/23/2017  . Obesity, unspecified 09/23/2017  . Hypokalemia 09/23/2017  . Melanocytic nevi, unspecified 09/23/2017  . Pain in right knee 09/23/2017   . Dysuria 09/23/2017  . Pain in right ankle and joints of right foot 09/23/2017  . Essential hypertension 09/23/2017  . Other fatigue 09/23/2017  . Vitamin D deficiency, unspecified 09/23/2017  . Iron deficiency anemia, unspecified 09/23/2017  . OSA on CPAP 09/23/2017     1. OSA (obstructive sleep apnea)   On CPAP therapy compliance good plan is going to be to continue with the CPAP on the current pressure settings.  2. BMI 40.0-44.9, adult ([place]) Obesity Counseling: Had a lengthy discussion regarding patients BMI and weight issues. Patient was instructed on portion control as well as increased activity. Also discussed caloric restrictions with trying to maintain intake less than 2000 Kcal. Discussions were made in accordance with the 5As of weight management. Simple actions such as not eating late and if able to, taking a walk is  suggested.   3. CPAP use counseling CPAP Counseling: had a lengthy discussion with the patient regarding the importance of PAP therapy in management of the sleep apnea. Patient appears to understand the risk factor reduction and also understands the risks associated with untreated sleep apnea. Patient will try to make a good faith effort to remain compliant with therapy. Also instructed the patient on proper cleaning of the device including the water must be changed daily if possible and use of distilled water is preferred. Patient understands that the machine should be regularly cleaned with appropriate recommended cleaning solutions that do not damage the PAP machine for example given white vinegar and water rinses. Other methods such as ozone treatment may not be as good as these simple methods to achieve cleaning.   4. History of COVID-19   In recovery patient does have some residual symptoms will get a follow-up chest x-ray to make certain that she has had resolution of the infiltrates. - DG Chest 2 View; Future   General Counseling: I have discussed the  findings of the evaluation and examination with Rodena Piety.  I have also discussed any further diagnostic evaluation thatmay be needed or ordered today. Casee verbalizes understanding of the findings of todays visit. We also reviewed her medications today and discussed drug interactions and side effects including but not limited excessive drowsiness and altered mental states. We also discussed that there is always a risk not just to her but also people around her. she has been encouraged to call the office with any questions or concerns that should arise related to todays visit.  Orders Placed This Encounter  Procedures  . DG Chest 2 View    Standing Status:   Future    Standing Expiration Date:   10/24/2021    Order Specific Question:   Reason for Exam (SYMPTOM  OR DIAGNOSIS REQUIRED)    Answer:   post covid    Order Specific Question:   Preferred imaging location?    Answer:   [facility]     Time spent: 28  I have personally obtained a history, examined the patient, evaluated laboratory and imaging results, formulated the assessment and plan and placed orders.    Allyne Gee, MD [facility] Pulmonary and Critical Care Sleep medicine

## 2020-10-24 NOTE — Patient Instructions (Signed)
CPAP and BPAP Information CPAP and BPAP are methods that use air pressure to keep your airways open and to help you breathe well. CPAP and BPAP use different amounts of pressure. Your health care provider will tell you whether CPAP or BPAP would be more helpful for you.  CPAP stands for "continuous positive airway pressure." With CPAP, the amount of pressure stays the same while you breathe in and out.  BPAP stands for "bi-level positive airway pressure." With BPAP, the amount of pressure will be higher when you breathe in (inhale) and lower when you breathe out(exhale). This allows you to take larger breaths. CPAP or BPAP may be used in the hospital, or your health care provider may want you to use it at home. You may need to have a sleep study before your health care provider can order a machine for you to use at home. Why are CPAP and BPAP treatments used? CPAP or BPAP can be helpful if you have:  Sleep apnea.  Chronic obstructive pulmonary disease (COPD).  Heart failure.  Medical conditions that cause muscle weakness, including muscular dystrophy or amyotrophic lateral sclerosis (ALS).  Other problems that cause breathing to be shallow, weak, abnormal, or difficult. CPAP and BPAP are most commonly used for obstructive sleep apnea (OSA) to keep the airways from collapsing when the muscles relax during sleep. How is CPAP or BPAP administered? Both CPAP and BPAP are provided by a small machine with a flexible plastic tube that attaches to a plastic mask that you wear. Air is blown through the mask into your nose or mouth. The amount of pressure that is used to blow the air can be adjusted on the machine. Your health care provider will set the pressure setting and help you find the best mask for you. When should CPAP or BPAP be used? In most cases, the mask only needs to be worn during sleep. Generally, the mask needs to be worn throughout the night and during any daytime naps. People with  certain medical conditions may also need to wear the mask at other times when they are awake. Follow instructions from your health care provider about when to use the machine. What are some tips for using the mask?  Because the mask needs to be snug, some people feel trapped or closed-in (claustrophobic) when first using the mask. If you feel this way, you may need to get used to the mask. One way to do this is to hold the mask loosely over your nose or mouth and then gradually apply the mask more snugly. You can also gradually increase the amount of time that you use the mask.  Masks are available in various types and sizes. If your mask does not fit well, talk with your health care provider about getting a different one. Some common types of masks include: ? Full face masks, which fit over the mouth and nose. ? Nasal masks, which fit over the nose. ? Nasal pillow or prong masks, which fit into the nostrils.  If you are using a mask that fits over your nose and you tend to breathe through your mouth, a chin strap may be applied to help keep your mouth closed.  Some CPAP and BPAP machines have alarms that may sound if the mask comes off or develops a leak.  If you have trouble with the mask, it is very important that you talk with your health care provider about finding a way to make the mask easier to  tolerate. Do not stop using the mask. There could be a negative impact to your health if you stop using the mask.   What are some tips for using the machine?  Place your CPAP or BPAP machine on a secure table or stand near an electrical outlet.  Know where the on/off switch is on the machine.  Follow instructions from your health care provider about how to set the pressure on your machine and when you should use it.  Do not eat or drink while the CPAP or BPAP machine is on. Food or fluids could get pushed into your lungs by the pressure of the CPAP or BPAP.  For home use, CPAP and BPAP  machines can be rented or purchased through home health care companies. Many different brands of machines are available. Renting a machine before purchasing may help you find out which particular machine works well for you. Your insurance may also decide which machine you may get.  Keep the CPAP or BPAP machine and attachments clean. Ask your health care provider for specific instructions. Follow these instructions at home:  Do not use any products that contain nicotine or tobacco, such as cigarettes, e-cigarettes, and chewing tobacco. If you need help quitting, ask your health care provider.  Keep all follow-up visits as told by your health care provider. This is important. Contact a health care provider if:  You have redness or pressure sores on your head, face, mouth, or nose from the mask or head gear.  You have trouble using the CPAP or BPAP machine.  You cannot tolerate wearing the CPAP or BPAP mask.  Someone tells you that you snore even when wearing your CPAP or BPAP. Get help right away if:  You have trouble breathing.  You feel confused. Summary  CPAP and BPAP are methods that use air pressure to keep your airways open and to help you breathe well.  You may need to have a sleep study before your health care provider can order a machine for home use.  If you have trouble with the mask, it is very important that you talk with your health care provider about finding a way to make the mask easier to tolerate. Do not stop using the mask. There could be a negative impact to your health if you stop using the mask.  Follow instructions from your health care provider about when to use the machine. This information is not intended to replace advice given to you by your health care provider. Make sure you discuss any questions you have with your health care provider. Document Revised: 09/24/2019 Document Reviewed: 09/27/2019 Elsevier Patient Education  2021 [place].   Sleep  Apnea Sleep apnea affects breathing during sleep. It causes breathing to stop for a short time or to become shallow. It can also increase the risk of:  Heart attack.  Stroke.  Being very overweight (obese).  Diabetes.  Heart failure.  Irregular heartbeat. The goal of treatment is to help you breathe normally again. What are the causes? There are three kinds of sleep apnea:  Obstructive sleep apnea. This is caused by a blocked or collapsed airway.  Central sleep apnea. This happens when the brain does not send the right signals to the muscles that control breathing.  Mixed sleep apnea. This is a combination of obstructive and central sleep apnea. The most common cause of this condition is a collapsed or blocked airway. This can happen if:  Your throat muscles are too relaxed.  Your tongue and tonsils are too large.  You are overweight.  Your airway is too small.   What increases the risk?  Being overweight.  Smoking.  Having a small airway.  Being older.  Being female.  Drinking alcohol.  Taking medicines to calm yourself (sedatives or tranquilizers).  Having family members with the condition. What are the signs or symptoms?  Trouble staying asleep.  Being sleepy or tired during the day.  Getting angry a lot.  Loud snoring.  Headaches in the morning.  Not being able to focus your mind (concentrate).  Forgetting things.  Less interest in sex.  Mood swings.  Personality changes.  Feelings of sadness (depression).  Waking up a lot during the night to pee (urinate).  Dry mouth.  Sore throat. How is this diagnosed?  Your medical history.  A physical exam.  A test that is done when you are sleeping (sleep study). The test is most often done in a sleep lab but may also be done at home. How is this treated?  Sleeping on your side.  Using a medicine to get rid of mucus in your nose (decongestant).  Avoiding the use of alcohol, medicines  to help you relax, or certain pain medicines (narcotics).  Losing weight, if needed.  Changing your diet.  Not smoking.  Using a machine to open your airway while you sleep, such as: ? An oral appliance. This is a mouthpiece that shifts your lower jaw forward. ? A CPAP device. This device blows air through a mask when you breathe out (exhale). ? An EPAP device. This has valves that you put in each nostril. ? A BPAP device. This device blows air through a mask when you breathe in (inhale) and breathe out.  Having surgery if other treatments do not work. It is important to get treatment for sleep apnea. Without treatment, it can lead to:  High blood pressure.  Coronary artery disease.  In men, not being able to have an erection (impotence).  Reduced thinking ability.   Follow these instructions at home: Lifestyle  Make changes that your doctor recommends.  Eat a healthy diet.  Lose weight if needed.  Avoid alcohol, medicines to help you relax, and some pain medicines.  Do not use any products that contain nicotine or tobacco, such as cigarettes, e-cigarettes, and chewing tobacco. If you need help quitting, ask your doctor. General instructions  Take over-the-counter and prescription medicines only as told by your doctor.  If you were given a machine to use while you sleep, use it only as told by your doctor.  If you are having surgery, make sure to tell your doctor you have sleep apnea. You may need to bring your device with you.  Keep all follow-up visits as told by your doctor. This is important. Contact a doctor if:  The machine that you were given to use during sleep bothers you or does not seem to be working.  You do not get better.  You get worse. Get help right away if:  Your chest hurts.  You have trouble breathing in enough air.  You have an uncomfortable feeling in your back, arms, or stomach.  You have trouble talking.  One side of your body  feels weak.  A part of your face is hanging down. These symptoms may be an emergency. Do not wait to see if the symptoms will go away. Get medical help right away. Call your local emergency services (911 in the [place]  Do not drive yourself to the hospital. Summary  This condition affects breathing during sleep.  The most common cause is a collapsed or blocked airway.  The goal of treatment is to help you breathe normally while you sleep. This information is not intended to replace advice given to you by your health care provider. Make sure you discuss any questions you have with your health care provider. Document Revised: 06/19/2018 Document Reviewed: 04/28/2018 Elsevier Patient Education  [place].

## 2020-11-03 ENCOUNTER — Ambulatory Visit (INDEPENDENT_AMBULATORY_CARE_PROVIDER_SITE_OTHER): Payer: Managed Care, Other (non HMO) | Admitting: Obstetrics and Gynecology

## 2020-11-03 ENCOUNTER — Other Ambulatory Visit: Payer: Self-pay

## 2020-11-03 ENCOUNTER — Encounter: Payer: Self-pay | Admitting: Obstetrics and Gynecology

## 2020-11-03 VITALS — BP 136/74 | Ht 67.0 in | Wt 267.4 lb

## 2020-11-03 DIAGNOSIS — Z9889 Other specified postprocedural states: Secondary | ICD-10-CM

## 2020-11-03 NOTE — Progress Notes (Signed)
  Postoperative Follow-up Patient presents post op from hysteroscopy D&C with  myosure polypectomy  for abnormal uterine bleeding- and endometrial polyp , 1 week ago.  Subjective: Patient reports some improvement in her preop symptoms. Eating a regular diet without difficulty. Pain is controlled without any medications.  Activity: normal activities of daily living. Patient reports additional symptom's since surgery of Vaginal discharge.She reports no odor. She denies itching. She had this discharge before surgery as well. She declines testing with a swab today.   Objective: BP 136/74   Ht 5\' 7"  (1.702 m)   Wt 267 lb 6.4 oz (121.3 kg)   BMI 41.88 kg/m  Physical Exam Constitutional:      Appearance: She is well-developed.  HENT:     Head: Normocephalic and atraumatic.  Neck:     Thyroid: No thyromegaly.  Cardiovascular:     Rate and Rhythm: Normal rate and regular rhythm.     Heart sounds: Normal heart sounds.  Pulmonary:     Effort: Pulmonary effort is normal.     Breath sounds: Normal breath sounds.  Abdominal:     General: Bowel sounds are normal. There is no distension.     Palpations: Abdomen is soft. There is no mass.  Musculoskeletal:     Cervical back: Neck supple.  Neurological:     Mental Status: She is alert and oriented to person, place, and time.  Skin:    General: Skin is warm and dry.  Psychiatric:        Behavior: Behavior normal.        Thought Content: Thought content normal.        Judgment: Judgment normal.  Vitals reviewed.     Assessment: s/p :  hysteroscopy D&C with  myosure polypectomy stable  Plan: Patient has done well after surgery with no apparent complications.  I have discussed the post-operative course to date, and the expected progress moving forward.  The patient understands what complications to be concerned about.  I will see the patient in routine follow up, or sooner if needed.    Activity plan: No restriction.  Pelvic  rest.  Adrian Prows MD, [place] 11/03/2020 5:15 PM

## 2020-11-24 IMAGING — US US PELVIS COMPLETE WITH TRANSVAGINAL
1 series · 13 of 25 positions shown · non-contrast
Comparison: Prior CT from 07/18/2020 as well as prior ultrasound
from 04/06/2007.

CLINICAL DATA: Initial evaluation for enlarging uterine fibroid.



[Series 1: us pelvis complete with transvaginal · 0.24mm/px · 13 of 84 slices shown]
[im 1/84]
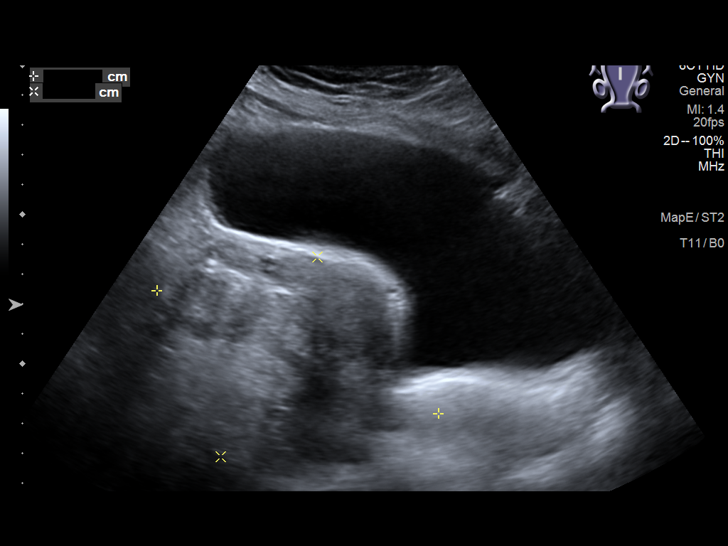
[im 7/84]
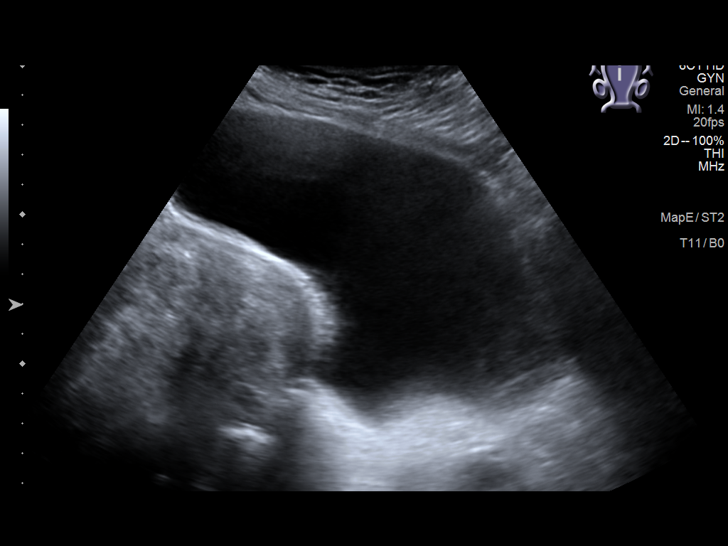
[im 14/84]
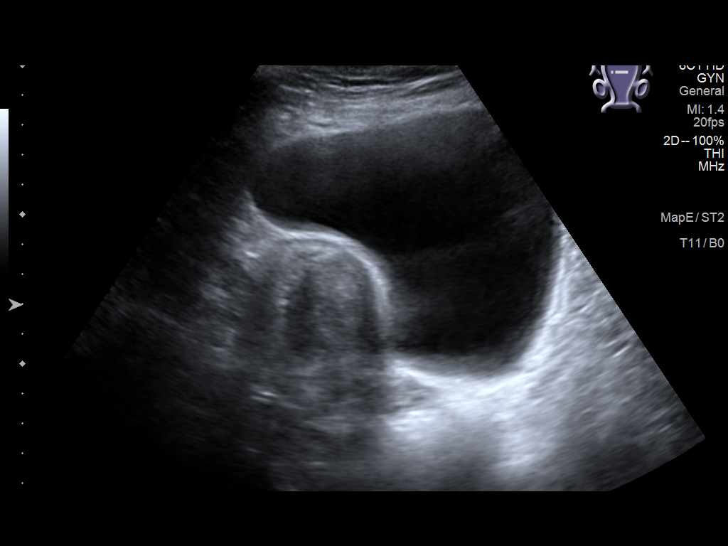
[im 21/84]
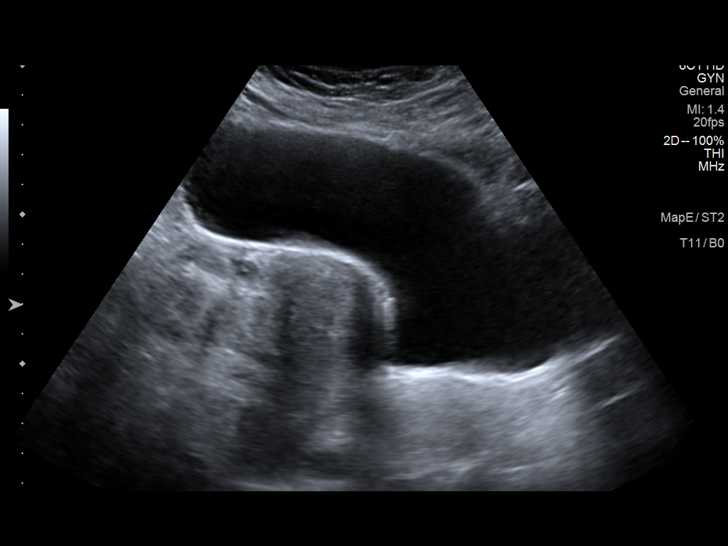
[im 28/84]
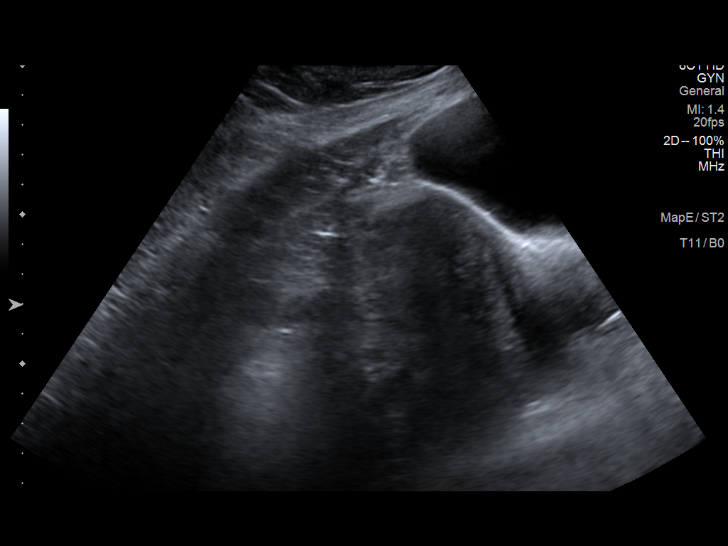
[im 35/84]
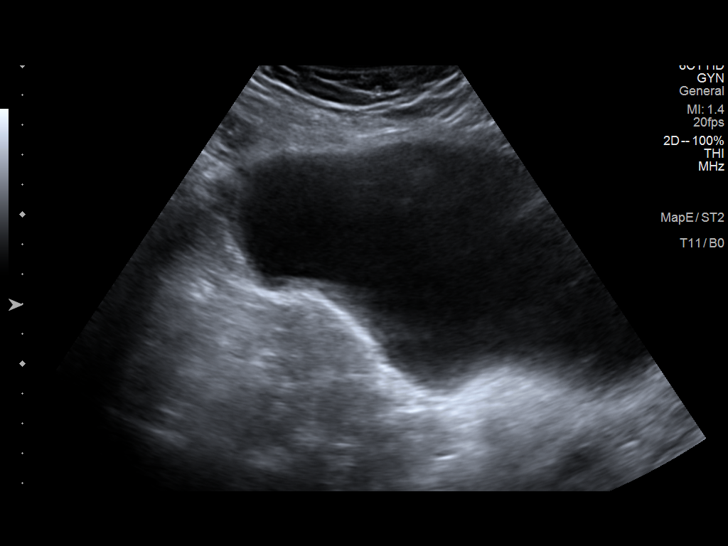
[im 42/84]
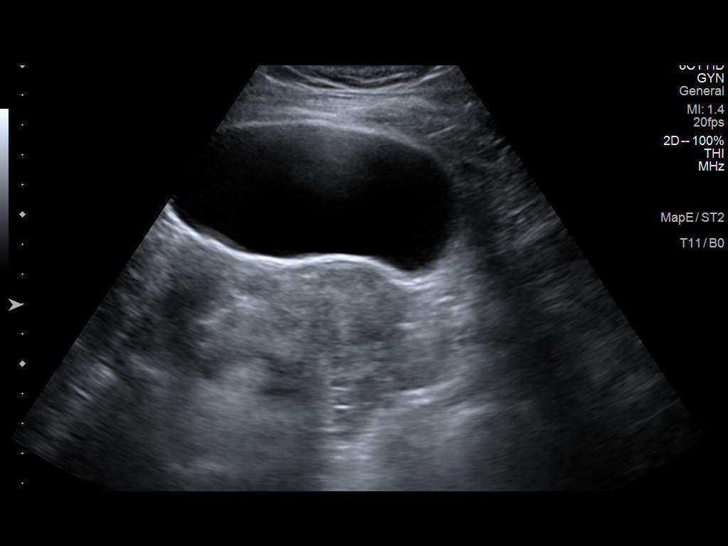
[im 49/84]
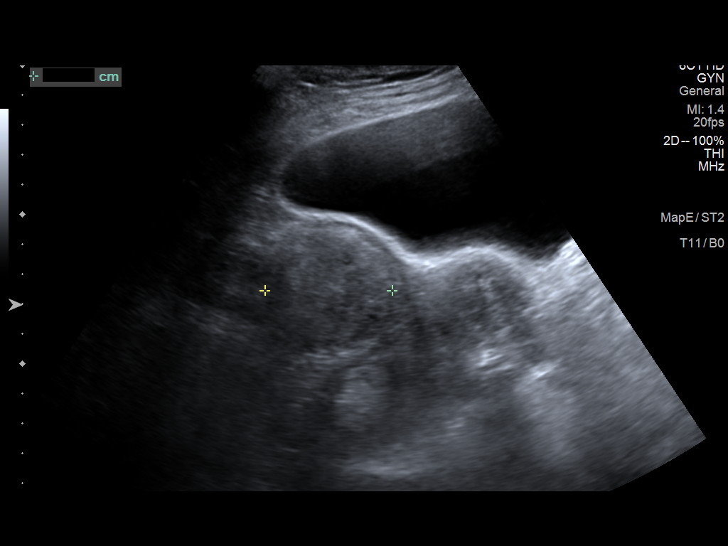
[im 56/84]
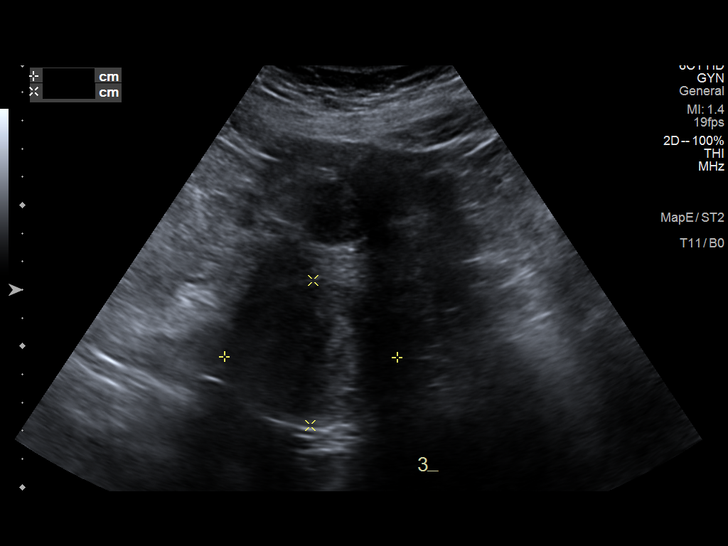
[im 63/84]
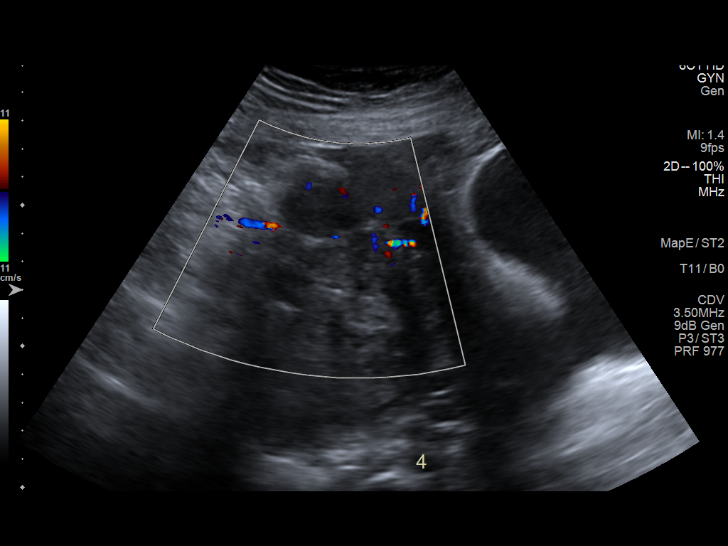
[im 70/84]
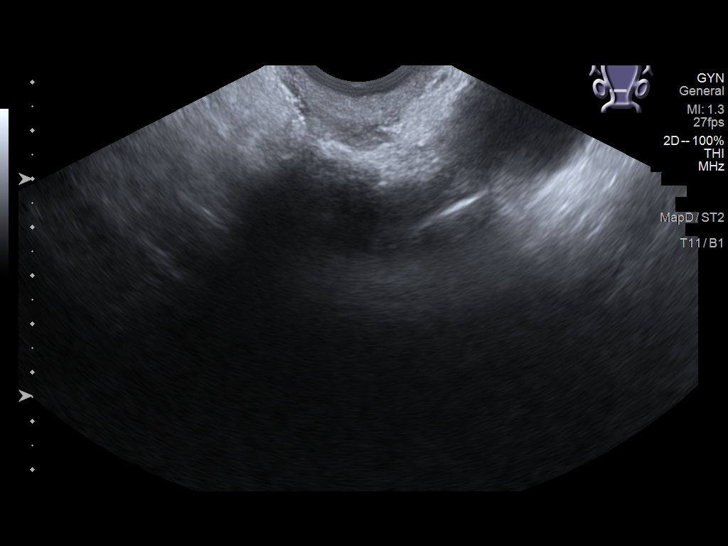
[im 77/84]
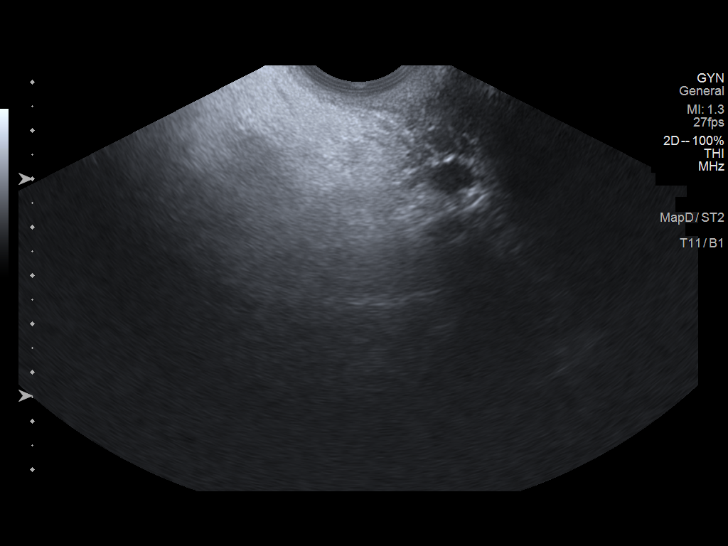
[im 84/84]
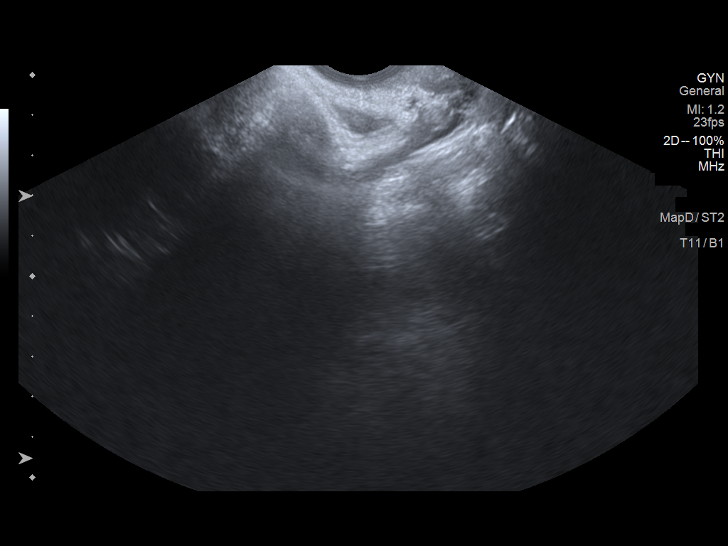

[13 of 25 positions shown; findings below may reference images not displayed]

FINDINGS: Uterus

Measurements: 10.3 x 7.4 x 10.2 cm. Uterus is diffusely enlarged
with multiple fibroids present. Four largest and distinct of these
are measured. 5.0 x 4.4 x 4.3 cm subserosal fibroid present at the
right uterine body. 5.1 x 3.3 x 4.9 cm subserosal fibroid present at
the left uterine body. 6.1 x 5.1 x 5.6 cm intramural to subserosal
fibroid present at the right uterine fundus. 2.3 x 2.5 x 2.8 cm
hypoechoic/cystic exophytic fibroid present at the central uterine
fundus.

Endometrium

Not visualized due to overlying fibroids.

Right ovary

Not visualized.  No adnexal mass.

Left ovary

Not visualized.  No adnexal mass.

Other findings

No abnormal free fluid.
IMPRESSION: 1. Enlarged fibroid uterus as detailed above. Overall, these appear
mildly enlarged on average as compared to most recent available
ultrasound from 9885.
2. Nonvisualization of the endometrial stripe due to overlying
fibroids.
3. Nonvisualization of either ovary. No other adnexal mass or free
fluid.

## 2020-12-25 ENCOUNTER — Other Ambulatory Visit: Payer: Self-pay

## 2020-12-25 DIAGNOSIS — E559 Vitamin D deficiency, unspecified: Secondary | ICD-10-CM

## 2020-12-25 DIAGNOSIS — I700 Atherosclerosis of aorta: Secondary | ICD-10-CM

## 2020-12-25 DIAGNOSIS — I10 Essential (primary) hypertension: Secondary | ICD-10-CM

## 2020-12-25 MED ORDER — BISOPROLOL FUMARATE 5 MG PO TABS: 5.0000 mg | ORAL_TABLET | Freq: Every day | ORAL | 1 refills | Status: DC

## 2020-12-25 MED ORDER — HYDROCHLOROTHIAZIDE 25 MG PO TABS: 25.0000 mg | ORAL_TABLET | Freq: Every day | ORAL | 5 refills | Status: DC

## 2020-12-25 MED ORDER — ROSUVASTATIN CALCIUM 5 MG PO TABS: 5.0000 mg | ORAL_TABLET | Freq: Every day | ORAL | 1 refills | Status: DC

## 2020-12-25 MED ORDER — AMLODIPINE BESYLATE 10 MG PO TABS: 10.0000 mg | ORAL_TABLET | Freq: Every day | ORAL | 1 refills | Status: DC

## 2020-12-25 MED ORDER — CALCITRIOL 0.25 MCG PO CAPS: 0.2500 ug | ORAL_CAPSULE | Freq: Every day | ORAL | 5 refills | Status: DC

## 2020-12-28 ENCOUNTER — Ambulatory Visit: Payer: Managed Care, Other (non HMO) | Admitting: Physician Assistant

## 2021-02-26 ENCOUNTER — Encounter: Payer: Self-pay | Admitting: Nurse Practitioner

## 2021-02-26 ENCOUNTER — Other Ambulatory Visit: Payer: Self-pay

## 2021-02-26 ENCOUNTER — Ambulatory Visit: Payer: Medicare HMO | Admitting: Nurse Practitioner

## 2021-02-26 VITALS — BP 142/78 | HR 80 | Temp 97.4°F | Resp 16 | Ht 67.0 in | Wt 270.6 lb

## 2021-02-26 DIAGNOSIS — R0602 Shortness of breath: Secondary | ICD-10-CM | POA: Diagnosis not present

## 2021-02-26 DIAGNOSIS — Z6841 Body Mass Index (BMI) 40.0 and over, adult: Secondary | ICD-10-CM

## 2021-02-26 DIAGNOSIS — R600 Localized edema: Secondary | ICD-10-CM

## 2021-02-26 DIAGNOSIS — I10 Essential (primary) hypertension: Secondary | ICD-10-CM | POA: Diagnosis not present

## 2021-02-26 MED ORDER — CHLORTHALIDONE 25 MG PO TABS: 12.5000 mg | ORAL_TABLET | Freq: Every day | ORAL | 0 refills | Status: DC

## 2021-02-26 NOTE — Progress Notes (Signed)
[facility] [place], [place] [address]  Internal MEDICINE  Office Visit Note  Patient Name: Jill Banks  [id number]  [id number]  Date of Service: 03/05/2021  Chief Complaint  Patient presents with   Acute Visit    Swelling in ankle and legs for a couple weeks   Quality Metric Gaps    Shingrix, covid booster     HPI Jill Banks presents for an acute sick visit for swelling in her legs and ankles. She denies any pain in her legs, claudication, chest pain, redness, or ulcers on the lower extremities. She has noticed the swelling for the past couple of weeks. She has a history of sleep apnea, morbid obesity, hypertension, hyperlipidemia. She gets short of breath walking short distances and sometimes when she is talking as well.    Current Medication:  Outpatient Encounter Medications as of 02/26/2021  Medication Sig   acetaminophen (TYLENOL) 500 MG tablet Take 2 tablets (1,000 mg total) by mouth every 6 (six) hours as needed.   amLODipine (NORVASC) 10 MG tablet Take 1 tablet (10 mg total) by mouth daily.   bisoprolol (ZEBETA) 5 MG tablet Take 1 tablet (5 mg total) by mouth daily.   calcitRIOL (ROCALTROL) 0.25 MCG capsule Take 1 capsule (0.25 mcg total) by mouth daily.   chlorthalidone (HYGROTON) 25 MG tablet Take 0.5 tablets (12.5 mg total) by mouth daily.   diclofenac sodium (VOLTAREN) 1 % GEL Apply 2 g topically 4 (four) times daily as needed (knee pain).   hydrochlorothiazide (HYDRODIURIL) 25 MG tablet Take 1 tablet (25 mg total) by mouth daily.   ibuprofen (ADVIL) 600 MG tablet Take 1 tablet (600 mg total) by mouth every 6 (six) hours as needed.   naproxen sodium (ALEVE) 220 MG tablet Take 440 mg by mouth 2 (two) times daily as needed (pain).   NON FORMULARY cpap device   potassium chloride SA (KLOR-CON M20) 20 MEQ tablet Take 1 tablet (20 mEq total) by mouth 2 (two) times daily.   rosuvastatin (CRESTOR) 5 MG tablet Take 1 tablet (5 mg total) by mouth  daily.   No facility-administered encounter medications on file as of 02/26/2021.      Medical History: Past Medical History:  Diagnosis Date   Anxiety    Arthritis    Bruises easily    COVID-19 04/2019   Headache    HLD (hyperlipidemia)    Hypertension    Leg pain    Pneumonia    about 5 yrs ago   Sleep apnea      Vital Signs: BP (!) 142/78   Pulse 80   Temp (!) 97.4 F (36.3 C)   Resp 16   Ht 5' 7"  (1.702 m)   Wt 270 lb 9.6 oz (122.7 kg)   SpO2 97%   BMI 42.38 kg/m    Review of Systems  Constitutional:  Negative for chills, fatigue and fever.  Eyes:  Negative for visual disturbance.  Respiratory:  Positive for shortness of breath. Negative for cough, chest tightness and wheezing.   Cardiovascular:  Negative for chest pain and palpitations.  Gastrointestinal: Negative.   Genitourinary:  Negative for decreased urine volume and difficulty urinating.  Musculoskeletal:  Positive for joint swelling (ankles bilateral).  Neurological:  Negative for dizziness, light-headedness and headaches.  Psychiatric/Behavioral:  Negative for behavioral problems. The patient is not nervous/anxious.    Physical Exam Vitals reviewed.  Constitutional:      General: She is not in acute distress.  Appearance: Normal appearance. She is well-developed and well-groomed. She is morbidly obese. She is not ill-appearing.  HENT:     Head: Normocephalic and atraumatic.  Cardiovascular:     Rate and Rhythm: Normal rate and regular rhythm.     Pulses:          Dorsalis pedis pulses are 2+ on the right side and 2+ on the left side.       Posterior tibial pulses are 2+ on the right side and 2+ on the left side.     Heart sounds: Normal heart sounds, S1 normal and S2 normal. No murmur heard. Pulmonary:     Effort: Pulmonary effort is normal. No tachypnea, accessory muscle usage or respiratory distress.     Breath sounds: Normal breath sounds and air entry. No decreased breath sounds,  wheezing, rhonchi or rales.  Musculoskeletal:     Right lower leg: No tenderness. 1+ Pitting Edema present.     Left lower leg: No tenderness. 1+ Pitting Edema present.     Right ankle: Swelling present. No tenderness.     Left ankle: Swelling present. No tenderness.     Right foot: Normal capillary refill. Swelling present. No tenderness. Normal pulse.     Left foot: Normal capillary refill. Swelling present. No tenderness. Normal pulse.  Skin:    General: Skin is warm and dry.     Capillary Refill: Capillary refill takes less than 2 seconds.  Neurological:     Mental Status: She is alert and oriented to person, place, and time.  Psychiatric:        Mood and Affect: Mood normal.        Behavior: Behavior normal. Behavior is cooperative.    Assessment/Plan: 1. Bilateral lower extremity edema BLE edema noted with no sign of injury, infection, or pain. The edema is +1 pitting. She does get short of breath with minimal exertion and has other comorbidities including morbid obesity, hyperlipidemia, and hypertension. Echocardigram and BNP ordered to assess cardiac function. Thyroid panel, CBC, and CMP ordered to rule out other causes for lower extremity edema. Chlorthalidone prescribed to help decrease swelling.  - ECHOCARDIOGRAM COMPLETE; Future - B Nat Peptide - TSH + free T4 - CBC with Differential/Platelet - CMP14+EGFR - chlorthalidone (HYGROTON) 25 MG tablet; Take 0.5 tablets (12.5 mg total) by mouth daily.  Dispense: 30 tablet; Refill: 0  2. Shortness of breath Patient does get short of breath with minimal exertion and has other related symptoms and comorbidities as previously described. Will assess cardiac function. Then may consider PFT as well.  - ECHOCARDIOGRAM COMPLETE; Future - B Nat Peptide   3. Essential hypertension Patients blood pressure is a little elevated 142/78. Currently taking bisoprolol, hydrochlorothiazide, and amlodipine. Chlorthalidone prescribed.  -  chlorthalidone (HYGROTON) 25 MG tablet; Take 0.5 tablets (12.5 mg total) by mouth daily.  Dispense: 30 tablet; Refill: 0  4. BMI 40.0-44.9, adult (HCC) Current BMI is 42.38. current with is 270 lbs. She has gained 3 lbs since her previous office visit. Discussed working on diet modifications including portion control, moderation, decreasing high carb and high sugar foods and drinks as well as increasing lean protein. Will discuss further at follow up visit.    General Counseling: moriya mitchell understanding of the findings of todays visit and agrees with plan of treatment. I have discussed any further diagnostic evaluation that may be needed or ordered today. We also reviewed her medications today. she has been encouraged to call the office with any  questions or concerns that should arise related to todays visit.    Counseling:    Orders Placed This Encounter  Procedures   B Nat Peptide   TSH + free T4   CBC with Differential/Platelet   CMP14+EGFR   ECHOCARDIOGRAM COMPLETE    Meds ordered this encounter  Medications   chlorthalidone (HYGROTON) 25 MG tablet    Sig: Take 0.5 tablets (12.5 mg total) by mouth daily.    Dispense:  30 tablet    Refill:  0    [place] Controlled Substance Database was reviewed by me.  Time spent:30 Minutes  Return in about 6 weeks (around 04/09/2021) for F/U, BP check, Weight loss, Review labs/test, Bane Hagy PCP.  This patient was seen by Jonetta Osgood, FNP-C in collaboration with Dr. Clayborn Bigness as a part of collaborative care agreement.  Cordon Gassett R. Valetta Fuller, MSN, FNP-C Internal Medicine

## 2021-03-09 ENCOUNTER — Encounter: Payer: Self-pay | Admitting: Nurse Practitioner

## 2021-04-10 ENCOUNTER — Telehealth: Payer: Self-pay

## 2021-04-10 NOTE — Telephone Encounter (Signed)
Left vm for 04/11/21 appointment screening-Toni 

## 2021-04-11 ENCOUNTER — Encounter: Payer: Self-pay | Admitting: Nurse Practitioner

## 2021-04-11 ENCOUNTER — Ambulatory Visit (INDEPENDENT_AMBULATORY_CARE_PROVIDER_SITE_OTHER): Payer: Medicare HMO | Admitting: Nurse Practitioner

## 2021-04-11 ENCOUNTER — Other Ambulatory Visit: Payer: Self-pay

## 2021-04-11 VITALS — BP 152/84 | HR 60 | Temp 97.6°F | Resp 16 | Ht 67.0 in | Wt 271.6 lb

## 2021-04-11 DIAGNOSIS — R600 Localized edema: Secondary | ICD-10-CM

## 2021-04-11 DIAGNOSIS — I700 Atherosclerosis of aorta: Secondary | ICD-10-CM

## 2021-04-11 DIAGNOSIS — G4733 Obstructive sleep apnea (adult) (pediatric): Secondary | ICD-10-CM

## 2021-04-11 DIAGNOSIS — E119 Type 2 diabetes mellitus without complications: Secondary | ICD-10-CM

## 2021-04-11 DIAGNOSIS — I10 Essential (primary) hypertension: Secondary | ICD-10-CM | POA: Diagnosis not present

## 2021-04-11 DIAGNOSIS — Z9989 Dependence on other enabling machines and devices: Secondary | ICD-10-CM

## 2021-04-11 DIAGNOSIS — R0602 Shortness of breath: Secondary | ICD-10-CM

## 2021-04-11 DIAGNOSIS — Z6841 Body Mass Index (BMI) 40.0 and over, adult: Secondary | ICD-10-CM

## 2021-04-11 MED ORDER — CHLORTHALIDONE 50 MG PO TABS: 50.0000 mg | ORAL_TABLET | Freq: Every day | ORAL | 2 refills | Status: DC

## 2021-04-11 NOTE — Progress Notes (Signed)
[facility] [place], [place] [address]  Internal MEDICINE  Office Visit Note  Patient Name: Jill Banks  [id number]  [id number]  Date of Service: 04/11/2021  Chief Complaint  Patient presents with  . Follow-up    Meds are not working for both feet, patients states its getting harder to breath when walking,    HPI Raahi presents for a follow-up visit to review medications and discuss shortness of breath with exertion.  She was last seen on June 13 for bilateral lower extremity edema and shortness of breath.  She has shortness of breath walking only short distances.  She reports shortness of breath walking from her car to the lobby of the clinic today.  During her office visit in June, her A1c was going to be checked but the machine that processes the blood specimen was having technical difficulties that day and it was unable to be done.  -A1C checked today and it is 6.5. patient has new onset type 2 diabetes. She would prefer to start off with diet modifications for a period of time before trying diabetic medications which is a reasonable request.  -persistent bilateral lower extremity edema and shortness of breath with minimal exertion--concern for possible decrease cardiac function and/or developing heart disease/early heart failure which was discussed with patient, will order labs and imaging to investigate.    Current Medication: Outpatient Encounter Medications as of 04/11/2021  Medication Sig  . amLODipine (NORVASC) 10 MG tablet Take 1 tablet (10 mg total) by mouth daily.  . bisoprolol (ZEBETA) 5 MG tablet Take 1 tablet (5 mg total) by mouth daily.  . calcitRIOL (ROCALTROL) 0.25 MCG capsule Take 1 capsule (0.25 mcg total) by mouth daily.  . chlorthalidone (HYGROTON) 50 MG tablet Take 1 tablet (50 mg total) by mouth daily.  . diclofenac sodium (VOLTAREN) 1 % GEL Apply 2 g topically 4 (four) times daily as needed (knee pain).  . hydrochlorothiazide  (HYDRODIURIL) 25 MG tablet Take 1 tablet (25 mg total) by mouth daily.  . naproxen sodium (ALEVE) 220 MG tablet Take 440 mg by mouth 2 (two) times daily as needed (pain).  . NON FORMULARY cpap device  . potassium chloride SA (KLOR-CON M20) 20 MEQ tablet Take 1 tablet (20 mEq total) by mouth 2 (two) times daily.  . rosuvastatin (CRESTOR) 5 MG tablet Take 1 tablet (5 mg total) by mouth daily.  Marland Kitchen acetaminophen (TYLENOL) 500 MG tablet Take 2 tablets (1,000 mg total) by mouth every 6 (six) hours as needed. (Patient not taking: Reported on 04/11/2021)  . ibuprofen (ADVIL) 600 MG tablet Take 1 tablet (600 mg total) by mouth every 6 (six) hours as needed. (Patient not taking: Reported on 04/11/2021)  . [DISCONTINUED] chlorthalidone (HYGROTON) 25 MG tablet Take 0.5 tablets (12.5 mg total) by mouth daily. (Patient not taking: Reported on 04/11/2021)   No facility-administered encounter medications on file as of 04/11/2021.    Surgical History: Past Surgical History:  Procedure Laterality Date  . APPLICATION OF WOUND VAC 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   Procedure: APPLICATION OF WOUND VAC;  Surgeon: Hessie Knows, MD;  Location: [facility] ORS;  Service: Orthopedics;  Laterality: Left;  [id number]  . DILATATION & CURETTAGE/HYSTEROSCOPY WITH MYOSURE  10/17/2020   Procedure: [place] POLYPECTOMY;  Surgeon: Homero Fellers, MD;  Location: [facility] ORS;  Service: Gynecology;;  . DILATION AND CURETTAGE OF UTERUS     x 3  . TOE SURGERY Left    second toe  .  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 Anxiety   . Arthritis   . Bruises easily   . COVID-19 04/2019  . Headache   . HLD (hyperlipidemia)   . Hypertension   . Leg pain   . Pneumonia    about 5 yrs ago  . Sleep apnea     Family  History: Family History  Problem Relation Age of Onset  . Hypertension Father   . Stroke Father   . Hypertension Brother   . Stroke Brother   . Hyperlipidemia Brother   . Asthma Son     Social History   Socioeconomic History  . Marital status: Married    Spouse name: Not on file  . Number of children: Not on file  . Years of education: Not on file  . Highest education level: Not on file  Occupational History  . Not on file  Tobacco Use  . Smoking status: Former  . Smokeless tobacco: Never  Vaping Use  . Vaping Use: Never used  Substance and Sexual Activity  . Alcohol use: Yes    Comment: ocassionally  . Drug use: No  . Sexual activity: Yes  Other Topics Concern  . Not on file  Social History Narrative  . Not on file   Social Determinants of Health   Financial Resource Strain: Not on file  Food Insecurity: Not on file  Transportation Needs: Not on file  Physical Activity: Not on file  Stress: Not on file  Social Connections: Not on file  Intimate Partner Violence: Not on file      Review of Systems  Constitutional:  Positive for fatigue.  HENT: Negative.    Respiratory:  Positive for shortness of breath. Negative for cough, choking, chest tightness, wheezing and stridor.   Cardiovascular:  Positive for palpitations and leg swelling. Negative for chest pain.  Gastrointestinal:  Negative for abdominal pain, constipation, diarrhea, nausea and vomiting.  Genitourinary: Negative.   Musculoskeletal:  Positive for arthralgias.  Neurological:  Positive for headaches (intermittent). Negative for dizziness and numbness.  Psychiatric/Behavioral:  Positive for sleep disturbance (OSA, but uses CPAP which helps). Negative for self-injury and suicidal ideas.    Vital Signs: BP (!) 152/84   Pulse 60   Temp 97.6 F (36.4 C)   Resp 16   Ht '5\' 7"'$  (1.702 m)   Wt 271 lb 9.6 oz (123.2 kg)   SpO2 96%   BMI 42.54 kg/m    Physical Exam Vitals reviewed.   Constitutional:      General: She is not in acute distress.    Appearance: Normal appearance. She is obese. She is not ill-appearing or toxic-appearing.  HENT:     Head: Normocephalic and atraumatic.  Neck:     Vascular: No carotid bruit.  Cardiovascular:     Rate and Rhythm: Normal rate and regular rhythm.     Pulses: Normal pulses.     Heart sounds: Normal heart sounds. No murmur heard. Pulmonary:     Effort: Pulmonary effort is normal. No respiratory distress.     Breath sounds: Normal breath sounds. No wheezing.  Musculoskeletal:     Cervical back: Normal range of motion and neck supple.     Right lower leg: Edema present.     Left lower leg: Edema present.  Lymphadenopathy:     Cervical: No cervical adenopathy.  Skin:    General: Skin is warm and dry.     Capillary Refill: Capillary refill takes less than 2 seconds.  Neurological:     Mental Status: She is alert and oriented to person, place, and time.  Psychiatric:        Mood and Affect: Mood normal.        Behavior: Behavior normal.    Assessment/Plan: 1. New onset type 2 diabetes mellitus ([place]) Delia has had elevated glucose levels on prior labs. A1C was checked today and was 6.5 which confirms a diagnosis of type 2 diabetes. She is requesting to start out using diet modifications to control the diabetes without pharmacological intervention. This is a reasonable request. Lionel will focus on diet and lifestyle modifications and her A1C will be checked again in 3 months to see if there has been any improvement. Sachet was provided with an Accu-check glucose meter and received one-on-one instruction from the nursing staff on how to perform a fingerstick glucose check and how to operate the Accu-check meter. Glucose test strips and lancets will be prescribed. Isley was instructed to check her glucose level once daily in the morning prior to breakfast and to bring her glucose readings in to her next office visit in 4 weeks.   -Diet modifications discussed: Decrease high-carbohydrate and high-sugar foods in daily diet: this includes breads, pastas, cereals, starches, pastries, and sweets Soft drinks and juices are discouraged especially regular soft drinks due to high sugar content Increase lean protein in diet; the best lean proteins include chicken, [place], fish, soy, venison, bison. If eating red meat, limit to a couple of times a week and look for meat with a lower percentage of fat, less marbling and trim large sections of fat from the meat before eating it.  - POCT glycosylated hemoglobin (Hb A1C)  2. Shortness of breath Teriah has shortness of breath with minimal exertion over short distances including walking from her parked car to the lobby of the clinic which is less than 100 feet. Dr. Devona Konig previously ordered a chest xray, patient was reminded to get the chest xray done. This may help rule out any respiratory etiology. An echocardiogram was also ordered at her previous office visit to evaluate cardiac function to rule out conditions of impaired cardiac function.   3. Bilateral lower extremity edema The persistent bilateral edema in her lower legs is also concerning for possible impaired cardiac function, will evaluate with echocardiogram and labs. HSCRP and BNP ordered on [facility] order sheet provided to patient.  4. Essential hypertension She is currently taking amlodipine 10 mg daily, hydrochlorothiazide 25 mg daily, bisoprolol 5 mg daily and she was started on chlorthalidone at her last office visit. Chlorthalidone dose was increased to 50 mg daily today due to continued elevated blood pressure. Will follow up in 4 weeks.  - chlorthalidone (HYGROTON) 50 MG tablet; Take 1 tablet (50 mg total) by mouth daily.  Dispense: 30 tablet; Refill: 2  5. OSA on CPAP Has OSA, uses her CPAP at night which helps, followed by Dr. Devona Konig.  6. Aortic atherosclerosis (HCC) Prior imaging has identified aortic  atherosclerosis, bilateral carotid ultrasound ordered to establish baseline imaging, no prior imaging found the carotid arteries.  - US Carotid Duplex Bilateral; Future  7. BMI 40.0-44.9, adult ([place]) Obesity is a significant risk factor for many other problems. Breajah has significant comorbidities. Weight loss could potentially improve her hypertension and  diabetes. Briefly discussed diet modifications that would make a positive impact on her health and her weight. Patient is easily short of breath now but when she is able to handle it better, encouraged her to engage in physical activity that she enjoys, moderate intensity is acceptable, does not have to be high intensity exercise. Once the shortness of breath is under control, encourage patient to try to be active for more than half the days in the week.     General Counseling: lateesha musch understanding of the findings of todays visit and agrees with plan of treatment. I have discussed any further diagnostic evaluation that may be needed or ordered today. We also reviewed her medications today. she has been encouraged to call the office with any questions or concerns that should arise related to todays visit.    Orders Placed This Encounter  Procedures  . US Carotid Duplex Bilateral     Meds ordered this encounter  Medications  . chlorthalidone (HYGROTON) 50 MG tablet    Sig: Take 1 tablet (50 mg total) by mouth daily.    Dispense:  30 tablet    Refill:  2    Return in about 4 weeks (around 05/09/2021) for F/U new onset diabetes, Surina Storts PCP.   Total time spent:45 Minutes Time spent includes review of chart, medications, test results, and follow up plan with the patient.   [place] Controlled Substance Database was reviewed by me.  This patient was seen by Jonetta Osgood, FNP-C in collaboration with Dr. Clayborn Bigness as a part of collaborative care agreement.   Leilanni Halvorson R. Valetta Fuller, MSN, FNP-C Internal medicine

## 2021-04-19 ENCOUNTER — Other Ambulatory Visit: Payer: Self-pay | Admitting: Nurse Practitioner

## 2021-04-20 LAB — CBC WITH DIFFERENTIAL/PLATELET
Basophils Absolute: 0 10*3/uL (ref 0.0–0.2)
Basos: 1 %
EOS (ABSOLUTE): 0.3 10*3/uL (ref 0.0–0.4)
Eos: 3 %
Hematocrit: 43.5 % (ref 34.0–46.6)
Hemoglobin: 14.3 g/dL (ref 11.1–15.9)
Immature Grans (Abs): 0 10*3/uL (ref 0.0–0.1)
Immature Granulocytes: 0 %
Lymphocytes Absolute: 2.5 10*3/uL (ref 0.7–3.1)
Lymphs: 32 %
MCH: 26.9 pg (ref 26.6–33.0)
MCHC: 32.9 g/dL (ref 31.5–35.7)
MCV: 82 fL (ref 79–97)
Monocytes Absolute: 0.9 10*3/uL (ref 0.1–0.9)
Monocytes: 11 %
Neutrophils Absolute: 4.2 10*3/uL (ref 1.4–7.0)
Neutrophils: 53 %
Platelets: 144 10*3/uL — ABNORMAL LOW (ref 150–450)
RBC: 5.32 x10E6/uL — ABNORMAL HIGH (ref 3.77–5.28)
RDW: 15.1 % (ref 11.7–15.4)
WBC: 7.9 10*3/uL (ref 3.4–10.8)

## 2021-04-20 LAB — COMPREHENSIVE METABOLIC PANEL
ALT: 17 IU/L (ref 0–32)
AST: 23 IU/L (ref 0–40)
Albumin/Globulin Ratio: 1.7 (ref 1.2–2.2)
Albumin: 4.2 g/dL (ref 3.8–4.8)
Alkaline Phosphatase: 73 IU/L (ref 44–121)
BUN/Creatinine Ratio: 17 (ref 12–28)
BUN: 14 mg/dL (ref 8–27)
Bilirubin Total: 0.4 mg/dL (ref 0.0–1.2)
CO2: 26 mmol/L (ref 20–29)
Calcium: 10.2 mg/dL (ref 8.7–10.3)
Chloride: 98 mmol/L (ref 96–106)
Creatinine, Ser: 0.81 mg/dL (ref 0.57–1.00)
Globulin, Total: 2.5 g/dL (ref 1.5–4.5)
Glucose: 117 mg/dL — ABNORMAL HIGH (ref 65–99)
Potassium: 4 mmol/L (ref 3.5–5.2)
Sodium: 140 mmol/L (ref 134–144)
Total Protein: 6.7 g/dL (ref 6.0–8.5)
eGFR: 81 mL/min/{1.73_m2} (ref >59)

## 2021-04-20 LAB — THYROID CASCADE PROFILE: TSH: 2.34 u[IU]/mL (ref 0.450–4.500)

## 2021-04-20 LAB — B12 AND FOLATE PANEL
Folate: >20 ng/mL (ref >3.0)
Vitamin B-12: 539 pg/mL (ref 232–1245)

## 2021-04-20 LAB — HIGH SENSITIVITY CRP: CRP, High Sensitivity: 7.43 mg/L — ABNORMAL HIGH (ref 0.00–3.00)

## 2021-04-20 LAB — BRAIN NATRIURETIC PEPTIDE: BNP: 7.8 pg/mL (ref 0.0–100.0)

## 2021-04-20 LAB — PRO B NATRIURETIC PEPTIDE: NT-Pro BNP: 12 pg/mL (ref 0–301)

## 2021-04-20 NOTE — Progress Notes (Signed)
FYI. I have reviewed the lab results. The patient has an upcoming appointment on 05/01/2021. Will discuss lab results with patient at her next office visit.

## 2021-04-23 ENCOUNTER — Telehealth: Payer: Self-pay

## 2021-04-23 LAB — POCT GLYCOSYLATED HEMOGLOBIN (HGB A1C): Hemoglobin A1C: 6.5 % — AB (ref 4.0–5.6)

## 2021-04-23 NOTE — Telephone Encounter (Signed)
Called and spoke with patient to inform her we reviewed the lab results. The patient has an upcoming appointment on8/16/2022. Will discuss lab results with patient at her next office visit.LNB

## 2021-04-24 ENCOUNTER — Ambulatory Visit: Payer: Medicare HMO | Admitting: Internal Medicine

## 2021-04-24 ENCOUNTER — Other Ambulatory Visit: Payer: Self-pay

## 2021-04-24 ENCOUNTER — Encounter: Payer: Self-pay | Admitting: Internal Medicine

## 2021-04-24 VITALS — BP 126/78 | HR 63 | Temp 97.3°F | Resp 16 | Ht 67.0 in | Wt 264.0 lb

## 2021-04-24 DIAGNOSIS — G4733 Obstructive sleep apnea (adult) (pediatric): Secondary | ICD-10-CM | POA: Diagnosis not present

## 2021-04-24 DIAGNOSIS — Z7189 Other specified counseling: Secondary | ICD-10-CM

## 2021-04-24 DIAGNOSIS — Z9989 Dependence on other enabling machines and devices: Secondary | ICD-10-CM | POA: Diagnosis not present

## 2021-04-24 DIAGNOSIS — Z6841 Body Mass Index (BMI) 40.0 and over, adult: Secondary | ICD-10-CM | POA: Diagnosis not present

## 2021-04-24 NOTE — Progress Notes (Signed)
[facility] [place], [place] [address]  Pulmonary Sleep Medicine   Office Visit Note  Patient Name: Jill Banks DOB: 06-24-1956 MRN [id number]  Date of Service: 04/24/2021  Complaints/HPI: OSA on CPAP obesity.  She has been on CPAP therapy states that she is doing okay.  As far as her weight is concerned she has had some issues with getting more weight off.  Her BMI is 41.35 and she states that she has been trying to work on weight loss.  As far as the machine is concerned she is tolerating it well.  The patient states she does feel better after using the device.  ROS  General: (-) fever, (-) chills, (-) night sweats, (-) weakness Skin: (-) rashes, (-) itching,. Eyes: (-) visual changes, (-) redness, (-) itching. Nose and Sinuses: (-) nasal stuffiness or itchiness, (-) postnasal drip, (-) nosebleeds, (-) sinus trouble. Mouth and Throat: (-) sore throat, (-) hoarseness. Neck: (-) swollen glands, (-) enlarged thyroid, (-) neck pain. Respiratory: - cough, (-) bloody sputum, + shortness of breath, - wheezing. Cardiovascular: + ankle swelling, (-) chest pain. Lymphatic: (-) lymph node enlargement. Neurologic: (-) numbness, (-) tingling. Psychiatric: (-) anxiety, (-) depression   Current Medication: Outpatient Encounter Medications as of 04/24/2021  Medication Sig   acetaminophen (TYLENOL) 500 MG tablet Take 2 tablets (1,000 mg total) by mouth every 6 (six) hours as needed.   amLODipine (NORVASC) 10 MG tablet Take 1 tablet (10 mg total) by mouth daily.   bisoprolol (ZEBETA) 5 MG tablet Take 1 tablet (5 mg total) by mouth daily.   calcitRIOL (ROCALTROL) 0.25 MCG capsule Take 1 capsule (0.25 mcg total) by mouth daily.   chlorthalidone (HYGROTON) 50 MG tablet Take 1 tablet (50 mg total) by mouth daily.   diclofenac sodium (VOLTAREN) 1 % GEL Apply 2 g topically 4 (four) times daily as needed (knee pain).   hydrochlorothiazide (HYDRODIURIL) 25 MG tablet Take 1  tablet (25 mg total) by mouth daily.   naproxen sodium (ALEVE) 220 MG tablet Take 440 mg by mouth 2 (two) times daily as needed (pain).   NON FORMULARY cpap device   potassium chloride SA (KLOR-CON M20) 20 MEQ tablet Take 1 tablet (20 mEq total) by mouth 2 (two) times daily.   rosuvastatin (CRESTOR) 5 MG tablet Take 1 tablet (5 mg total) by mouth daily.   [DISCONTINUED] ibuprofen (ADVIL) 600 MG tablet Take 1 tablet (600 mg total) by mouth every 6 (six) hours as needed. (Patient not taking: Reported on 04/11/2021)   No facility-administered encounter medications on file as of 04/24/2021.    Surgical History: Past Surgical History:  Procedure Laterality Date   APPLICATION OF WOUND VAC 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   Procedure: APPLICATION OF WOUND VAC;  Surgeon: Hessie Knows, MD;  Location: [facility] ORS;  Service: Orthopedics;  Laterality: Left;  #[id number]   DILATATION & CURETTAGE/HYSTEROSCOPY WITH MYOSURE  10/17/2020   Procedure: DILATATION & CURETTAGE/HYSTEROSCOPY WITH MYOSURE POLYPECTOMY;  Surgeon: Homero Fellers, MD;  Location: [facility] ORS;  Service: Gynecology;;   DILATION AND CURETTAGE OF UTERUS     x 3   TOE SURGERY Left    second toe   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   Procedure: LEFT TOTAL KNEE ARTHROPLASTY;  Surgeon: Hessie Knows, MD;  Location: [facility] ORS;  Service: Orthopedics;  Laterality: Left;    Medical History: Past Medical History:  Diagnosis Date   Anxiety    Arthritis    Bruises easily    COVID-19  04/2019   Headache    HLD (hyperlipidemia)    Hypertension    Leg pain    Pneumonia    about 5 yrs ago   Sleep apnea     Family History: Family History  Problem Relation Age of Onset   Hypertension Father    Stroke Father    Hypertension Brother    Stroke Brother    Hyperlipidemia Brother    Asthma Son     Social History: Social History   Socioeconomic History   Marital status: Married    Spouse name: Not on file   Number of children: Not on file   Years of  education: Not on file   Highest education level: Not on file  Occupational History   Not on file  Tobacco Use   Smoking status: Former   Smokeless tobacco: Never  Vaping Use   Vaping Use: Never used  Substance and Sexual Activity   Alcohol use: Yes    Comment: ocassionally   Drug use: No   Sexual activity: Yes  Other Topics Concern   Not on file  Social History Narrative   Not on file   Social Determinants of Health   Financial Resource Strain: Not on file  Food Insecurity: Not on file  Transportation Needs: Not on file  Physical Activity: Not on file  Stress: Not on file  Social Connections: Not on file  Intimate Partner Violence: Not on file    Vital Signs: Blood pressure 126/78, pulse 63, temperature (!) 97.3 F (36.3 C), resp. rate 16, height 5' 7"  (1.702 m), weight 264 lb (119.7 kg), SpO2 97 %.  Examination: General Appearance: The patient is well-developed, well-nourished, and in no distress. Skin: Gross inspection of skin unremarkable. Head: normocephalic, no gross deformities. Eyes: no gross deformities noted. ENT: ears appear grossly normal no exudates. Neck: Supple. No thyromegaly. No LAD. Respiratory: no rhonchi noted at this time. Cardiovascular: Normal S1 and S2 without murmur or rub. Extremities: No cyanosis. pulses are equal. Neurologic: Alert and oriented. No involuntary movements.  LABS: Recent Results (from the past 2160 hour(s))  CBC with Differential/Platelet     Status: Abnormal   Collection Time: 04/19/21  9:52 AM  Result Value Ref Range   WBC 7.9 3.4 - 10.8 x10E3/uL   RBC 5.32 (H) 3.77 - 5.28 x10E6/uL   Hemoglobin 14.3 11.1 - 15.9 g/dL   Hematocrit 43.5 34.0 - 46.6 %   MCV 82 79 - 97 fL   MCH 26.9 26.6 - 33.0 pg   MCHC 32.9 31.5 - 35.7 g/dL   RDW 15.1 11.7 - 15.4 %   Platelets 144 (L) 150 - 450 x10E3/uL   Neutrophils 53 Not Estab. %   Lymphs 32 Not Estab. %   Monocytes 11 Not Estab. %   Eos 3 Not Estab. %   Basos 1 Not Estab. %    Neutrophils Absolute 4.2 1.4 - 7.0 x10E3/uL   Lymphocytes Absolute 2.5 0.7 - 3.1 x10E3/uL   Monocytes Absolute 0.9 0.1 - 0.9 x10E3/uL   EOS (ABSOLUTE) 0.3 0.0 - 0.4 x10E3/uL   Basophils Absolute 0.0 0.0 - 0.2 x10E3/uL   Immature Granulocytes 0 Not Estab. %   Immature Grans (Abs) 0.0 0.0 - 0.1 x10E3/uL  Comprehensive metabolic panel     Status: Abnormal   Collection Time: 04/19/21  9:52 AM  Result Value Ref Range   Glucose 117 (H) 65 - 99 mg/dL   BUN 14 8 - 27 mg/dL   Creatinine,  Ser 0.81 0.57 - 1.00 mg/dL   eGFR 81 >59 mL/min/1.73   BUN/Creatinine Ratio 17 12 - 28   Sodium 140 134 - 144 mmol/L   Potassium 4.0 3.5 - 5.2 mmol/L   Chloride 98 96 - 106 mmol/L   CO2 26 20 - 29 mmol/L   Calcium 10.2 8.7 - 10.3 mg/dL   Total Protein 6.7 6.0 - 8.5 g/dL   Albumin 4.2 3.8 - 4.8 g/dL   Globulin, Total 2.5 1.5 - 4.5 g/dL   Albumin/Globulin Ratio 1.7 1.2 - 2.2   Bilirubin Total 0.4 0.0 - 1.2 mg/dL   Alkaline Phosphatase 73 44 - 121 IU/L   AST 23 0 - 40 IU/L   ALT 17 0 - 32 IU/L  B12 and Folate Panel     Status: None   Collection Time: 04/19/21  9:52 AM  Result Value Ref Range   Vitamin B-12 539 232 - 1,245 pg/mL   Folate >20.0 >3.0 ng/mL    Comment: A serum folate concentration of less than 3.1 ng/mL is considered to represent clinical deficiency.   High sensitivity CRP     Status: Abnormal   Collection Time: 04/19/21  9:52 AM  Result Value Ref Range   CRP, High Sensitivity 7.43 (H) 0.00 - 3.00 mg/L    Comment:          Relative Risk for Future Cardiovascular Event                              Low                 <1.00                              Average       1.00 - 3.00                              High                >3.00   Brain natriuretic peptide     Status: None   Collection Time: 04/19/21  9:52 AM  Result Value Ref Range   BNP 7.8 0.0 - 100.0 pg/mL  Pro b natriuretic peptide (BNP)     Status: None   Collection Time: 04/19/21  9:52 AM  Result Value Ref Range    NT-Pro BNP 12 0 - 301 pg/mL    Comment: The following cut-points have been suggested for the use of proBNP for the diagnostic evaluation of heart failure (HF) in patients with acute dyspnea: Modality                     Age           Optimal Cut                            (years)            Point ------------------------------------------------------ Diagnosis (rule in HF)        <50            450 pg/mL                           50 - 75  900 pg/mL                               >75           1800 pg/mL Exclusion (rule out HF)  Age independent     300 pg/mL   Thyroid Cascade Profile     Status: None   Collection Time: 04/19/21  9:52 AM  Result Value Ref Range   TSH 2.340 0.450 - 4.500 uIU/mL    Comment: No apparent thyroid disorder. Additional testing not indicated. In rare instances, Secondary Hypothyroidism as well as Subclinical Hypothyroidism have been reported in some patients with normal TSH values.   POCT glycosylated hemoglobin (Hb A1C)     Status: Abnormal   Collection Time: 04/23/21 10:04 AM  Result Value Ref Range   Hemoglobin A1C 6.5 (A) 4.0 - 5.6 %   HbA1c POC (<> result, manual entry)     HbA1c, POC (prediabetic range)     HbA1c, POC (controlled diabetic range)      Radiology: No results found.  No results found.  No results found.    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/2019   Encounter for general adult medical examination with abnormal findings 07/01/2019   Adjustment insomnia 07/01/2019    Encounter for screening mammogram for malignant neoplasm of breast 07/01/2019   Routine cervical smear 07/01/2019   Ventral hernia without obstruction or gangrene 07/01/2019   Pain and swelling of ankle, right 01/13/2019   Acute vaginitis 01/11/2018   Bacterial vaginitis 12/18/2017   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 OSA on CPAP On CPAP therapy plan is to continue with current pressure settings.  2. BMI 40.0-44.9, adult [facility]) The patient is asked to make an attempt to improve diet and exercise patterns to aid in medical management of this problem.  3. CPAP use counseling CPAP Counseling: had a lengthy discussion with the patient regarding the importance of PAP therapy in management of the sleep apnea. Patient appears to understand the risk factor reduction and also understands the risks associated with untreated sleep apnea. Patient will try to make a good faith effort to remain compliant with therapy. Also instructed the patient on proper cleaning of the device including the water must be changed daily if possible and use of distilled water is preferred. Patient understands that the machine should be regularly cleaned with appropriate recommended cleaning solutions that do not damage the PAP machine for example given white vinegar and water rinses. Other methods such as ozone treatment may not be as good as these  simple methods to achieve cleaning.    General Counseling: I have discussed the findings of the evaluation and examination with  Rodena Piety.  I have also discussed any further diagnostic evaluation thatmay be needed or ordered today. Aerianna verbalizes understanding of the findings of todays visit. We also reviewed her medications today and discussed drug interactions and side effects including but not limited excessive drowsiness and altered mental states. We also discussed that there is always a risk not just to her but also people around her. she has been encouraged to call the office with any questions or concerns that should arise related to todays visit.  No orders of the defined types were placed in this encounter.    Time spent: 49  I have personally obtained a history, examined the patient, evaluated laboratory and imaging results, formulated the assessment and plan and placed orders.    Allyne Gee, MD [facility] Pulmonary and Critical Care Sleep medicine

## 2021-04-24 NOTE — Patient Instructions (Signed)
Sleep Apnea Sleep apnea affects breathing during sleep. It causes breathing to stop for 10 seconds or more, or to become shallow. People with sleep apnea usually snoreloudly. It can also increase the risk of: Heart attack. Stroke. Being very overweight (obese). Diabetes. Heart failure. Irregular heartbeat. High blood pressure. The goal of treatment is to help you breathe normally again. What are the causes?  The most common cause of this condition is a collapsed or blocked airway. There are three kinds of sleep apnea: Obstructive sleep apnea. This is caused by a blocked or collapsed airway. Central sleep apnea. This happens when the brain does not send the right signals to the muscles that control breathing. Mixed sleep apnea. This is a combination of obstructive and central sleep apnea. What increases the risk? Being overweight. Smoking. Having a small airway. Being older. Being female. Drinking alcohol. Taking medicines to calm yourself (sedatives or tranquilizers). Having family members with the condition. Having a tongue or tonsils that are larger than normal. What are the signs or symptoms? Trouble staying asleep. Loud snoring. Headaches in the morning. Waking up gasping. Dry mouth or sore throat in the morning. Being sleepy or tired during the day. If you are sleepy or tired during the day, you may also: Not be able to focus your mind (concentrate). Forget things. Get angry a lot and have mood swings. Feel sad (depressed). Have changes in your personality. Have less interest in sex, if you are female. Be unable to have an erection, if you are female. How is this treated?  Sleeping on your side. Using a medicine to get rid of mucus in your nose (decongestant). Avoiding the use of alcohol, medicines to help you relax, or certain pain medicines (narcotics). Losing weight, if needed. Changing your diet. Quitting smoking. Using a machine to open your airway while you  sleep, such as: An oral appliance. This is a mouthpiece that shifts your lower jaw forward. A CPAP device. This device blows air through a mask when you breathe out (exhale). An EPAP device. This has valves that you put in each nostril. A BPAP device. This device blows air through a mask when you breathe in (inhale) and breathe out. Having surgery if other treatments do not work. Follow these instructions at home: Lifestyle Make changes that your doctor recommends. Eat a healthy diet. Lose weight if needed. Avoid alcohol, medicines to help you relax, and some pain medicines. Do not smoke or use any products that contain nicotine or tobacco. If you need help quitting, ask your doctor. General instructions Take over-the-counter and prescription medicines only as told by your doctor. If you were given a machine to use while you sleep, use it only as told by your doctor. If you are having surgery, make sure to tell your doctor you have sleep apnea. You may need to bring your device with you. Keep all follow-up visits. Contact a doctor if: The machine that you were given to use during sleep bothers you or does not seem to be working. You do not get better. You get worse. Get help right away if: Your chest hurts. You have trouble breathing in enough air. You have an uncomfortable feeling in your back, arms, or stomach. You have trouble talking. One side of your body feels weak. A part of your face is hanging down. These symptoms may be an emergency. Get help right away. Call your local emergency services (911 in the [place] Do not wait to see if the symptoms   will go away. Do not drive yourself to the hospital. Summary This condition affects breathing during sleep. The most common cause is a collapsed or blocked airway. The goal of treatment is to help you breathe normally while you sleep. This information is not intended to replace advice given to you by your health care provider. Make  sure you discuss any questions you have with your healthcare provider. Document Revised: 08/11/2020 Document Reviewed: 08/11/2020 Elsevier Patient Education  2022 [employer]  

## 2021-04-27 ENCOUNTER — Other Ambulatory Visit: Payer: Self-pay

## 2021-04-27 DIAGNOSIS — I10 Essential (primary) hypertension: Secondary | ICD-10-CM

## 2021-04-27 DIAGNOSIS — I700 Atherosclerosis of aorta: Secondary | ICD-10-CM

## 2021-04-27 MED ORDER — ROSUVASTATIN CALCIUM 5 MG PO TABS: 5.0000 mg | ORAL_TABLET | Freq: Every day | ORAL | 1 refills | Status: DC

## 2021-04-27 MED ORDER — AMLODIPINE BESYLATE 10 MG PO TABS: 10.0000 mg | ORAL_TABLET | Freq: Every day | ORAL | 1 refills | Status: DC

## 2021-05-01 ENCOUNTER — Ambulatory Visit: Payer: Medicare HMO | Admitting: Nurse Practitioner

## 2021-05-02 ENCOUNTER — Ambulatory Visit (INDEPENDENT_AMBULATORY_CARE_PROVIDER_SITE_OTHER): Payer: Medicare HMO

## 2021-05-02 ENCOUNTER — Other Ambulatory Visit: Payer: Self-pay

## 2021-05-02 DIAGNOSIS — I700 Atherosclerosis of aorta: Secondary | ICD-10-CM

## 2021-05-02 DIAGNOSIS — I6523 Occlusion and stenosis of bilateral carotid arteries: Secondary | ICD-10-CM

## 2021-05-08 NOTE — Procedures (Signed)
[place], [place] [address]  DATE OF SERVICE: May 02, 2021  CAROTID DOPPLER INTERPRETATION:  Bilateral Carotid Ultrsasound and Color Doppler Examination was performed. The RIGHT CCA shows mild plaque in the vessel. The LEFT CCA shows mild plaque in the vessel. There was no intimal thickening noted in the RIGHT carotid artery. There was no intimal thickening in the LEFT carotid artery.  The RIGHT CCA shows peak systolic velocity of 77 cm per second. The end diastolic velocity is 17 cm per second on the RIGHT side. The RIGHT ICA shows peak systolic velocity of 80 per second. RIGHT sided ICA end diastolic velocity is 31 cm per second. The RIGHT ECA shows a peak systolic velocity of 89 cm per second. The ICA/CCA ratio is calculated to be 1.04. This suggests less than 50% stenosis. The Vertebral Artery shows antegrade flow.  The LEFT CCA shows peak systolic velocity of 42 cm per second. The end diastolic velocity is 11 cm per second on the LEFT side. The LEFT ICA shows peak systolic velocity of 80 per second. LEFT sided ICA end diastolic velocity is 32 cm per second. The LEFT ECA shows a peak systolic velocity of 89 cm per second. The ICA/CCA ratio is calculated to be 1.9. This suggests less than 50% stenosis. The Vertebral Artery shows antegrade flow.   Impression:    The RIGHT CAROTID shows less than 50% stenosis. The LEFT CAROTID shows less than 50% stenosis.  There is mild plaque formation noted on the LEFT and mild plaque on the RIGHT  side. Consider a repeat Carotid doppler if clinical situation and symptoms warrant in 6-12 months. Patient should be encouraged to change lifestyles such as smoking cessation, regular exercise and dietary modification. Use of statins in the right clinical setting and ASA is encouraged.  Allyne Gee, MD [facility] Pulmonary Critical Care Medicine

## 2021-05-09 ENCOUNTER — Ambulatory Visit: Payer: Medicare HMO

## 2021-05-09 ENCOUNTER — Other Ambulatory Visit: Payer: Self-pay

## 2021-05-09 DIAGNOSIS — R0602 Shortness of breath: Secondary | ICD-10-CM

## 2021-05-09 DIAGNOSIS — R600 Localized edema: Secondary | ICD-10-CM

## 2021-05-24 ENCOUNTER — Other Ambulatory Visit: Payer: Self-pay

## 2021-05-24 ENCOUNTER — Ambulatory Visit (INDEPENDENT_AMBULATORY_CARE_PROVIDER_SITE_OTHER): Payer: Medicare HMO | Admitting: Nurse Practitioner

## 2021-05-24 ENCOUNTER — Encounter: Payer: Self-pay | Admitting: Nurse Practitioner

## 2021-05-24 VITALS — BP 126/86 | HR 64 | Temp 98.3°F | Resp 16 | Ht 67.0 in | Wt 268.4 lb

## 2021-05-24 DIAGNOSIS — E1165 Type 2 diabetes mellitus with hyperglycemia: Secondary | ICD-10-CM

## 2021-05-24 DIAGNOSIS — I10 Essential (primary) hypertension: Secondary | ICD-10-CM

## 2021-05-24 DIAGNOSIS — I700 Atherosclerosis of aorta: Secondary | ICD-10-CM | POA: Diagnosis not present

## 2021-05-24 DIAGNOSIS — E119 Type 2 diabetes mellitus without complications: Secondary | ICD-10-CM

## 2021-05-24 DIAGNOSIS — R252 Cramp and spasm: Secondary | ICD-10-CM

## 2021-05-24 MED ORDER — GLUCOSE BLOOD VI STRP: ORAL_STRIP | 3 refills | Status: DC

## 2021-05-24 MED ORDER — CHLORTHALIDONE 50 MG PO TABS: 25.0000 mg | ORAL_TABLET | Freq: Every day | ORAL | 2 refills | Status: DC

## 2021-05-24 MED ORDER — ACCU-CHEK SOFTCLIX LANCETS MISC: 3 refills | Status: DC

## 2021-05-24 NOTE — Progress Notes (Signed)
[facility] [place], [place] [address]  Internal MEDICINE  Office Visit Note  Patient Name: Jill Banks  [id number]  [id number]  Date of Service: 05/24/2021  Chief Complaint  Patient presents with   Follow-up    Results, discuss meds and side effects, numbness in hands, muscle pain when pt wakes up   Sleep Apnea   Hyperlipidemia   Hypertension   Anxiety   Diabetes   Fatigue    HPI Jill Banks presents for a follow-up visit to discuss results of echocardiogram and carotid ultrasound as well as recent lab results.  She also wants to discuss her medications.   The results of her echocardiogram showed diastolic dysfunction, left ventricular ejection fraction of 53.41%, bilateral atria slightly dilated, and mild aortic sclerosis.  There was no aortic regurgitation or stenosis and no significant dysfunction of other valves within the heart. The results of her bilateral carotid ultrasound showed less than 50% stenosis bilaterally. We discussed her lab results as well.  Her CRP was elevated at 7.43.  Her BN P level was normal.  Her CBC showed mild erythrocytosis and thrombocytopenia.  She has had chronic thrombocytopenia since late 2020 according to her lab results.  She has seen a hematologist for this and they were unable to find any significant cause for the low platelet level.  Her most recent platelet level is 144. She has recently started having numbness in her hands, andmuscle pain when she wakes up which has been new since she started the rosuvastatin.     Current Medication: Outpatient Encounter Medications as of 05/24/2021  Medication Sig   Accu-Chek Softclix Lancets lancets Use as instructed   acetaminophen (TYLENOL) 500 MG tablet Take 2 tablets (1,000 mg total) by mouth every 6 (six) hours as needed.   amLODipine (NORVASC) 10 MG tablet Take 1 tablet (10 mg total) by mouth daily.   bisoprolol (ZEBETA) 5 MG tablet Take 1 tablet (5 mg total) by mouth daily.    calcitRIOL (ROCALTROL) 0.25 MCG capsule Take 1 capsule (0.25 mcg total) by mouth daily.   diclofenac sodium (VOLTAREN) 1 % GEL Apply 2 g topically 4 (four) times daily as needed (knee pain).   glucose blood test strip Use as instructed   hydrochlorothiazide (HYDRODIURIL) 25 MG tablet Take 1 tablet (25 mg total) by mouth daily.   naproxen sodium (ALEVE) 220 MG tablet Take 440 mg by mouth 2 (two) times daily as needed (pain).   NON FORMULARY cpap device   potassium chloride SA (KLOR-CON M20) 20 MEQ tablet Take 1 tablet (20 mEq total) by mouth 2 (two) times daily.   rosuvastatin (CRESTOR) 5 MG tablet Take 1 tablet (5 mg total) by mouth daily.   [DISCONTINUED] chlorthalidone (HYGROTON) 50 MG tablet Take 1 tablet (50 mg total) by mouth daily.   chlorthalidone (HYGROTON) 50 MG tablet Take 0.5 tablets (25 mg total) by mouth daily.   [DISCONTINUED] amLODipine (NORVASC) 10 MG tablet Take 1 tablet (10 mg total) by mouth daily.   [DISCONTINUED] rosuvastatin (CRESTOR) 5 MG tablet Take 1 tablet (5 mg total) by mouth daily.   No facility-administered encounter medications on file as of 05/24/2021.    Surgical History: Past Surgical History:  Procedure Laterality Date   APPLICATION OF WOUND VAC 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   Procedure: APPLICATION OF WOUND VAC;  Surgeon: Hessie Knows, MD;  Location: [facility] ORS;  Service: Orthopedics;  Laterality: Left;  [id number]   DILATATION & CURETTAGE/HYSTEROSCOPY WITH MYOSURE  10/17/2020   Procedure:  DILATATION & CURETTAGE/HYSTEROSCOPY WITH MYOSURE POLYPECTOMY;  Surgeon: Homero Fellers, MD;  Location: [facility] ORS;  Service: Gynecology;;   DILATION AND CURETTAGE OF UTERUS     x 3   TOE SURGERY Left    second toe   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   Procedure: LEFT TOTAL KNEE ARTHROPLASTY;  Surgeon: Hessie Knows, MD;  Location: [facility] ORS;  Service: Orthopedics;  Laterality: Left;    Medical History: Past Medical History:  Diagnosis Date   Anxiety    Arthritis     Bruises easily    COVID-19 04/2019   Diabetes mellitus without complication (HCC)    Headache    HLD (hyperlipidemia)    Hypertension    Leg pain    Pneumonia    about 5 yrs ago   Sleep apnea     Family History: Family History  Problem Relation Age of Onset   Hypertension Father    Stroke Father    Hypertension Brother    Stroke Brother    Hyperlipidemia Brother    Asthma Son     Social History   Socioeconomic History   Marital status: Married    Spouse name: Not on file   Number of children: Not on file   Years of education: Not on file   Highest education level: Not on file  Occupational History   Not on file  Tobacco Use   Smoking status: Former   Smokeless tobacco: Never  Vaping Use   Vaping Use: Never used  Substance and Sexual Activity   Alcohol use: Yes    Comment: ocassionally   Drug use: No   Sexual activity: Yes  Other Topics Concern   Not on file  Social History Narrative   Not on file   Social Determinants of Health   Financial Resource Strain: Not on file  Food Insecurity: Not on file  Transportation Needs: Not on file  Physical Activity: Not on file  Stress: Not on file  Social Connections: Not on file  Intimate Partner Violence: Not on file      Review of Systems  Constitutional:  Negative for chills, fatigue and unexpected weight change.  HENT:  Negative for congestion, rhinorrhea, sneezing and sore throat.   Eyes:  Negative for redness.  Respiratory: Negative.  Negative for cough, chest tightness, shortness of breath and wheezing.   Cardiovascular: Negative.  Negative for chest pain and palpitations.  Gastrointestinal:  Negative for abdominal pain, constipation, diarrhea, nausea and vomiting.  Genitourinary:  Negative for dysuria and frequency.  Musculoskeletal:  Positive for arthralgias. Negative for back pain, joint swelling and neck pain.  Skin:  Negative for rash.  Neurological: Negative.  Negative for tremors and numbness.   Hematological:  Negative for adenopathy. Does not bruise/bleed easily.  Psychiatric/Behavioral:  Negative for behavioral problems (Depression), sleep disturbance and suicidal ideas. The patient is not nervous/anxious.    Vital Signs: BP 126/86   Pulse 64   Temp 98.3 F (36.8 C)   Resp 16   Ht '5\' 7"'$  (1.702 m)   Wt 268 lb 6.4 oz (121.7 kg)   SpO2 99%   BMI 42.04 kg/m    Physical Exam Vitals reviewed.  Constitutional:      General: She is not in acute distress.    Appearance: Normal appearance. She is obese. She is not ill-appearing.  HENT:     Head: Normocephalic and atraumatic.  Eyes:     Extraocular Movements: Extraocular movements intact.  Pupils: Pupils are equal, round, and reactive to light.  Cardiovascular:     Rate and Rhythm: Normal rate and regular rhythm.  Pulmonary:     Effort: Pulmonary effort is normal. No respiratory distress.  Neurological:     Mental Status: She is alert and oriented to person, place, and time.     Cranial Nerves: No cranial nerve deficit.     Coordination: Coordination normal.     Gait: Gait normal.  Psychiatric:        Mood and Affect: Mood normal.        Behavior: Behavior normal.     Assessment/Plan: 1. Type 2 diabetes mellitus with hyperglycemia, without long-term current use of insulin (HCC) Glucose levels are within goal range --under 140 at home. Urine microalbumin is normal. Patient continues to focus on diet modifications to improve glucose levels and A1C. Will consider oral diabetic medications if her A1C is >7.0 when checked. - Microalbumin, urine  2. Essential hypertension Not tolerating the increased dose, decrease back to 1/2 tablet daily of chlorthalidone. - chlorthalidone (HYGROTON) 50 MG tablet; Take 0.5 tablets (25 mg total) by mouth daily.  Dispense: 30 tablet; Refill: 2  3. Aortic atherosclerosis ([place]) Likely is not tolerating rosuvastatin due to achy muscles and joints that manifested after starting  rosuvastatin. Will discuss trying a different statin or trying fenofibrate at her follow up visit.   4. Muscle cramping Stop rosuvastatin. Check potassium level. - Potassium   General Counseling: Kashish verbalizes understanding of the findings of todays visit and agrees with plan of treatment. I have discussed any further diagnostic evaluation that may be needed or ordered today. We also reviewed her medications today. she has been encouraged to call the office with any questions or concerns that should arise related to todays visit.    Orders Placed This Encounter  Procedures   Potassium   POCT UA - Microalbumin    Meds ordered this encounter  Medications   chlorthalidone (HYGROTON) 50 MG tablet    Sig: Take 0.5 tablets (25 mg total) by mouth daily.    Dispense:  30 tablet    Refill:  2   glucose blood test strip    Sig: Use as instructed    Dispense:  300 each    Refill:  3   Accu-Chek Softclix Lancets lancets    Sig: Use as instructed    Dispense:  300 each    Refill:  3    Return in about 2 months (around 07/24/2021) for F/U, Recheck A1C, Remedios Mckone PCP.   Total time spent:30 Minutes Time spent includes review of chart, medications, test results, and follow up plan with the patient.   [place] Controlled Substance Database was reviewed by me.  This patient was seen by Jonetta Osgood, FNP-C in collaboration with Dr. Clayborn Bigness as a part of collaborative care agreement.   Durward Matranga R. Valetta Fuller, MSN, FNP-C Internal medicine

## 2021-05-25 ENCOUNTER — Other Ambulatory Visit: Payer: Self-pay

## 2021-05-25 DIAGNOSIS — E876 Hypokalemia: Secondary | ICD-10-CM

## 2021-05-25 LAB — MICROALBUMIN, URINE: Microalbumin, Urine: 20.7 ug/mL

## 2021-05-25 MED ORDER — POTASSIUM CHLORIDE CRYS ER 20 MEQ PO TBCR: 20.0000 meq | EXTENDED_RELEASE_TABLET | Freq: Two times a day (BID) | ORAL | 1 refills | Status: DC

## 2021-05-25 MED ORDER — ACCU-CHEK SOFTCLIX LANCETS MISC: 3 refills | Status: DC

## 2021-05-25 MED ORDER — GLUCOSE BLOOD VI STRP: ORAL_STRIP | 3 refills | Status: DC

## 2021-07-02 ENCOUNTER — Encounter: Payer: Self-pay | Admitting: General Surgery

## 2021-07-05 ENCOUNTER — Telehealth: Payer: Self-pay

## 2021-07-05 NOTE — Telephone Encounter (Signed)
Pt called and LMOM and tried to call pt back LMOM to return my call

## 2021-07-06 ENCOUNTER — Telehealth: Payer: Self-pay

## 2021-07-06 NOTE — Telephone Encounter (Signed)
Patient left a voicemail on the nurses line requesting her prescription for Hygroton be changed to 25mg . Patient states she spoke with provider in requesting the change that the 50mg  were a bit too much for her and the pills are so small she was having trouble cutting them in half.

## 2021-07-08 ENCOUNTER — Telehealth: Payer: Self-pay

## 2021-07-08 DIAGNOSIS — I10 Essential (primary) hypertension: Secondary | ICD-10-CM

## 2021-07-08 MED ORDER — BISOPROLOL FUMARATE 5 MG PO TABS: 5.0000 mg | ORAL_TABLET | Freq: Every day | ORAL | 1 refills | Status: DC

## 2021-07-08 NOTE — Telephone Encounter (Signed)
error 

## 2021-07-09 ENCOUNTER — Other Ambulatory Visit: Payer: Self-pay | Admitting: Internal Medicine

## 2021-07-09 ENCOUNTER — Telehealth: Payer: Self-pay

## 2021-07-09 DIAGNOSIS — R600 Localized edema: Secondary | ICD-10-CM

## 2021-07-09 DIAGNOSIS — I10 Essential (primary) hypertension: Secondary | ICD-10-CM

## 2021-07-09 MED ORDER — CHLORTHALIDONE 25 MG PO TABS: 25.0000 mg | ORAL_TABLET | Freq: Every day | ORAL | 1 refills | Status: DC

## 2021-07-09 MED ORDER — HYDROCHLOROTHIAZIDE 25 MG PO TABS: 25.0000 mg | ORAL_TABLET | Freq: Every day | ORAL | 5 refills | Status: DC

## 2021-07-09 NOTE — Telephone Encounter (Signed)
Spoke to pt and informed her that Alyssa changed the chlorthalidone to 25 mg and also ordered a CMP for pt to have done at [facility] pt she could cut the 50 mg ones in half also if she wanted to finish the 50 mg one first

## 2021-07-09 NOTE — Telephone Encounter (Signed)
Medicine is changed, pls check how much potassium she is taking and send BMP for me

## 2021-07-10 ENCOUNTER — Encounter: Payer: Self-pay | Admitting: Nurse Practitioner

## 2021-07-13 ENCOUNTER — Telehealth: Payer: Self-pay

## 2021-07-13 DIAGNOSIS — I10 Essential (primary) hypertension: Secondary | ICD-10-CM

## 2021-07-13 NOTE — Telephone Encounter (Signed)
Spoke with pt and informed her to stop the hydrochlorothiazide and informed her the bisoprolol was sent to the pharmacy on 07-08-2021. Pt said she didn't have bisoprolol but just picked up the hydrochlorothiazide. She will be going to pick the bisoprolol but is very frustrated because she does pay for these meds and there was some confusion on what she needed.

## 2021-07-13 NOTE — Telephone Encounter (Signed)
Spoke with pt, this has been taken care of

## 2021-07-13 NOTE — Telephone Encounter (Signed)
Lab does not need to be fasting. Stop hydrochlorothiazide. Do not stop bisoprolol.

## 2021-07-18 LAB — COMPREHENSIVE METABOLIC PANEL
ALT: 13 IU/L (ref 0–32)
AST: 17 IU/L (ref 0–40)
Albumin/Globulin Ratio: 1.6 (ref 1.2–2.2)
Albumin: 4.1 g/dL (ref 3.8–4.8)
Alkaline Phosphatase: 74 IU/L (ref 44–121)
BUN/Creatinine Ratio: 18 (ref 12–28)
BUN: 13 mg/dL (ref 8–27)
Bilirubin Total: 0.3 mg/dL (ref 0.0–1.2)
CO2: 27 mmol/L (ref 20–29)
Calcium: 9.9 mg/dL (ref 8.7–10.3)
Chloride: 99 mmol/L (ref 96–106)
Creatinine, Ser: 0.72 mg/dL (ref 0.57–1.00)
Globulin, Total: 2.5 g/dL (ref 1.5–4.5)
Glucose: 104 mg/dL — ABNORMAL HIGH (ref 70–99)
Potassium: 4.1 mmol/L (ref 3.5–5.2)
Sodium: 139 mmol/L (ref 134–144)
Total Protein: 6.6 g/dL (ref 6.0–8.5)
eGFR: 93 mL/min/{1.73_m2} (ref >59)

## 2021-07-26 ENCOUNTER — Ambulatory Visit: Payer: Medicare HMO | Admitting: Nurse Practitioner

## 2021-08-14 ENCOUNTER — Telehealth: Payer: Self-pay

## 2021-08-14 NOTE — Telephone Encounter (Signed)
Left vm to confirm 08/16/21 appointment-Toni

## 2021-08-16 ENCOUNTER — Ambulatory Visit (INDEPENDENT_AMBULATORY_CARE_PROVIDER_SITE_OTHER): Payer: Medicare HMO | Admitting: Nurse Practitioner

## 2021-08-16 ENCOUNTER — Encounter: Payer: Self-pay | Admitting: Nurse Practitioner

## 2021-08-16 ENCOUNTER — Other Ambulatory Visit: Payer: Self-pay

## 2021-08-16 VITALS — BP 131/73 | HR 69 | Temp 98.3°F | Resp 16 | Ht 67.0 in | Wt 265.0 lb

## 2021-08-16 DIAGNOSIS — I700 Atherosclerosis of aorta: Secondary | ICD-10-CM

## 2021-08-16 DIAGNOSIS — E559 Vitamin D deficiency, unspecified: Secondary | ICD-10-CM

## 2021-08-16 DIAGNOSIS — Z0001 Encounter for general adult medical examination with abnormal findings: Secondary | ICD-10-CM | POA: Diagnosis not present

## 2021-08-16 DIAGNOSIS — E1165 Type 2 diabetes mellitus with hyperglycemia: Secondary | ICD-10-CM

## 2021-08-16 DIAGNOSIS — Z1211 Encounter for screening for malignant neoplasm of colon: Secondary | ICD-10-CM

## 2021-08-16 DIAGNOSIS — E876 Hypokalemia: Secondary | ICD-10-CM | POA: Diagnosis not present

## 2021-08-16 DIAGNOSIS — Z1212 Encounter for screening for malignant neoplasm of rectum: Secondary | ICD-10-CM

## 2021-08-16 DIAGNOSIS — I10 Essential (primary) hypertension: Secondary | ICD-10-CM | POA: Diagnosis not present

## 2021-08-16 DIAGNOSIS — R300 Dysuria: Secondary | ICD-10-CM

## 2021-08-16 LAB — POCT GLYCOSYLATED HEMOGLOBIN (HGB A1C): Hemoglobin A1C: 6.1 % — AB (ref 4.0–5.6)

## 2021-08-16 MED ORDER — AMLODIPINE BESYLATE 10 MG PO TABS: 10.0000 mg | ORAL_TABLET | Freq: Every day | ORAL | 1 refills | Status: DC

## 2021-08-16 MED ORDER — ROSUVASTATIN CALCIUM 5 MG PO TABS: 5.0000 mg | ORAL_TABLET | Freq: Every day | ORAL | 1 refills | Status: DC

## 2021-08-16 MED ORDER — CHLORTHALIDONE 25 MG PO TABS: 25.0000 mg | ORAL_TABLET | Freq: Every day | ORAL | 1 refills | Status: DC

## 2021-08-16 MED ORDER — BISOPROLOL FUMARATE 5 MG PO TABS: 5.0000 mg | ORAL_TABLET | Freq: Every day | ORAL | 1 refills | Status: DC

## 2021-08-16 MED ORDER — POTASSIUM CHLORIDE CRYS ER 20 MEQ PO TBCR: 20.0000 meq | EXTENDED_RELEASE_TABLET | Freq: Two times a day (BID) | ORAL | 1 refills | Status: DC

## 2021-08-16 MED ORDER — CALCITRIOL 0.25 MCG PO CAPS: 0.2500 ug | ORAL_CAPSULE | Freq: Every day | ORAL | 5 refills | Status: DC

## 2021-08-16 NOTE — Progress Notes (Signed)
[facility] [place], [place] [address]  Internal MEDICINE  Office Visit Note  Patient Name: Jill Banks  [id number]  [id number]  Date of Service: 08/16/2021  Chief Complaint  Patient presents with   Medicare Wellness   Diabetes   Hyperlipidemia   Hypertension   Anxiety    HPI Jill Banks presents for an annual well visit and physical exam.  She is a well-appearing 65 year old female.  She has chronic medical problems including hypertension, aortic atherosclerosis, OSA on CPAP, diabetes, inflammatory polyarthritis.  Her A1c is 6.1 today which is a significant improvement from 6.5 in August this year.  She was initially diagnosed with new onset type 2 diabetes in August.  She has done well checking her sugars at least once a day with the glucose meter provided to her.  No medications have been started for diabetes.  The patient wanted to work on diet and lifestyle modifications first to avoid starting diabetic medications.  She has done well with this and has improved her A1c without medications.  She has lost 3 pounds since her previous office visit.  Her labs were drawn at a previous time and discussed at a previous office visit.  Her CMP was repeated and was normal.  She is due for a routine screening colonoscopy.  Annual mammogram was done in November and was normal.  She is requesting refills of medications.  She denies any pain, and has no other questions or concerns.    Current Medication: Outpatient Encounter Medications as of 08/16/2021  Medication Sig   Accu-Chek Softclix Lancets lancets Use as instructed to check blood sugars once daily   acetaminophen (TYLENOL) 500 MG tablet Take 2 tablets (1,000 mg total) by mouth every 6 (six) hours as needed.   diclofenac sodium (VOLTAREN) 1 % GEL Apply 2 g topically 4 (four) times daily as needed (knee pain).   glucose blood test strip Use as instructed to check blood sugars once daily E11.65   naproxen sodium (ALEVE)  220 MG tablet Take 440 mg by mouth 2 (two) times daily as needed (pain).   NON FORMULARY cpap device   [DISCONTINUED] amLODipine (NORVASC) 10 MG tablet Take 1 tablet (10 mg total) by mouth daily.   [DISCONTINUED] bisoprolol (ZEBETA) 5 MG tablet Take 1 tablet (5 mg total) by mouth daily.   [DISCONTINUED] calcitRIOL (ROCALTROL) 0.25 MCG capsule Take 1 capsule (0.25 mcg total) by mouth daily.   [DISCONTINUED] chlorthalidone (HYGROTON) 25 MG tablet Take 1 tablet (25 mg total) by mouth daily.   [DISCONTINUED] potassium chloride SA (KLOR-CON M20) 20 MEQ tablet Take 1 tablet (20 mEq total) by mouth 2 (two) times daily.   [DISCONTINUED] rosuvastatin (CRESTOR) 5 MG tablet Take 1 tablet (5 mg total) by mouth daily.   amLODipine (NORVASC) 10 MG tablet Take 1 tablet (10 mg total) by mouth daily.   bisoprolol (ZEBETA) 5 MG tablet Take 1 tablet (5 mg total) by mouth daily.   Blood Glucose Monitoring Suppl (ONETOUCH VERIO FLEX SYSTEM) w/Device KIT    calcitRIOL (ROCALTROL) 0.25 MCG capsule Take 1 capsule (0.25 mcg total) by mouth daily.   chlorthalidone (HYGROTON) 25 MG tablet Take 1 tablet (25 mg total) by mouth daily.   potassium chloride SA (KLOR-CON M20) 20 MEQ tablet Take 1 tablet (20 mEq total) by mouth 2 (two) times daily.   rosuvastatin (CRESTOR) 5 MG tablet Take 1 tablet (5 mg total) by mouth daily.   No facility-administered encounter medications on file as of  08/16/2021.    Surgical History: Past Surgical History:  Procedure Laterality Date   APPLICATION OF WOUND VAC 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   Procedure: APPLICATION OF WOUND VAC;  Surgeon: Hessie Knows, MD;  Location: [facility] ORS;  Service: Orthopedics;  Laterality: Left;  #[id number]   DILATATION & CURETTAGE/HYSTEROSCOPY WITH MYOSURE  10/17/2020   Procedure: DILATATION & CURETTAGE/HYSTEROSCOPY WITH MYOSURE POLYPECTOMY;  Surgeon: Homero Fellers, MD;  Location: [facility] ORS;  Service: Gynecology;;   DILATION AND CURETTAGE OF UTERUS     x 3   TOE SURGERY  Left    second toe   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   Procedure: LEFT TOTAL KNEE ARTHROPLASTY;  Surgeon: Hessie Knows, MD;  Location: [facility] ORS;  Service: Orthopedics;  Laterality: Left;    Medical History: Past Medical History:  Diagnosis Date   Anxiety    Arthritis    Bruises easily    COVID-19 04/2019   Diabetes mellitus without complication (HCC)    Headache    HLD (hyperlipidemia)    Hypertension    Leg pain    Pneumonia    about 5 yrs ago   Sleep apnea     Family History: Family History  Problem Relation Age of Onset   Hypertension Father    Stroke Father    Hypertension Brother    Stroke Brother    Hyperlipidemia Brother    Asthma Son     Social History   Socioeconomic History   Marital status: Married    Spouse name: Not on file   Number of children: Not on file   Years of education: Not on file   Highest education level: Not on file  Occupational History   Not on file  Tobacco Use   Smoking status: Former   Smokeless tobacco: Never  Vaping Use   Vaping Use: Never used  Substance and Sexual Activity   Alcohol use: Yes    Comment: ocassionally   Drug use: No   Sexual activity: Yes  Other Topics Concern   Not on file  Social History Narrative   Not on file   Social Determinants of Health   Financial Resource Strain: Not on file  Food Insecurity: Not on file  Transportation Needs: Not on file  Physical Activity: Not on file  Stress: Not on file  Social Connections: Not on file  Intimate Partner Violence: Not on file      Review of Systems  Constitutional:  Negative for activity change, appetite change, chills, fatigue, fever and unexpected weight change.  HENT: Negative.  Negative for congestion, ear pain, rhinorrhea, sore throat and trouble swallowing.   Eyes: Negative.   Respiratory: Negative.  Negative for cough, chest tightness, shortness of breath and wheezing.   Cardiovascular: Negative.  Negative for chest pain.   Gastrointestinal: Negative.  Negative for abdominal pain, blood in stool, constipation, diarrhea, nausea and vomiting.  Endocrine: Negative.   Genitourinary: Negative.  Negative for difficulty urinating, dysuria, frequency, hematuria and urgency.  Musculoskeletal: Negative.  Negative for arthralgias, back pain, joint swelling, myalgias and neck pain.  Skin: Negative.  Negative for rash and wound.  Allergic/Immunologic: Negative.  Negative for immunocompromised state.  Neurological: Negative.  Negative for dizziness, seizures, numbness and headaches.  Hematological: Negative.   Psychiatric/Behavioral: Negative.  Negative for behavioral problems, self-injury and suicidal ideas. The patient is not nervous/anxious.    Vital Signs: BP 131/73   Pulse 69   Temp 98.3 F (36.8 C)   Resp 16   Ht  5' 7"  (1.702 m)   Wt 265 lb (120.2 kg)   SpO2 97%   BMI 41.50 kg/m    Physical Exam Vitals reviewed.  Constitutional:      General: She is awake. She is not in acute distress.    Appearance: Normal appearance. She is well-developed and well-groomed. She is obese. She is not ill-appearing or diaphoretic.  HENT:     Head: Normocephalic and atraumatic.     Right Ear: Tympanic membrane, ear canal and external ear normal.     Left Ear: Tympanic membrane, ear canal and external ear normal.     Nose: Nose normal. No congestion or rhinorrhea.     Mouth/Throat:     Lips: Pink.     Mouth: Mucous membranes are moist.     Pharynx: Oropharynx is clear. Uvula midline. No oropharyngeal exudate.  Eyes:     General: Lids are normal. Vision grossly intact. Gaze aligned appropriately. No scleral icterus.       Right eye: No discharge.        Left eye: No discharge.     Extraocular Movements: Extraocular movements intact.     Conjunctiva/sclera: Conjunctivae normal.     Pupils: Pupils are equal, round, and reactive to light.     Funduscopic exam:    Right eye: Red reflex present.        Left eye: Red reflex  present. Neck:     Thyroid: No thyromegaly.     Vascular: No carotid bruit or JVD.     Trachea: Trachea and phonation normal. No tracheal deviation.  Cardiovascular:     Rate and Rhythm: Normal rate and regular rhythm.     Pulses: Normal pulses.     Heart sounds: Normal heart sounds, S1 normal and S2 normal. No murmur heard.   No friction rub. No gallop.  Pulmonary:     Effort: Pulmonary effort is normal. No accessory muscle usage or respiratory distress.     Breath sounds: Normal breath sounds and air entry. No stridor. No wheezing or rales.  Chest:     Chest wall: No tenderness.     Comments: Declined clinical breast exam, gets yearly mamograms Abdominal:     General: Bowel sounds are normal. There is no distension.     Palpations: Abdomen is soft. There is no shifting dullness, fluid wave, mass or pulsatile mass.     Tenderness: There is no abdominal tenderness. There is no guarding or rebound.  Musculoskeletal:        General: No tenderness or deformity. Normal range of motion.     Cervical back: Normal range of motion and neck supple.  Lymphadenopathy:     Cervical: No cervical adenopathy.  Skin:    General: Skin is warm and dry.     Capillary Refill: Capillary refill takes less than 2 seconds.     Coloration: Skin is not pale.     Findings: No erythema or rash.  Neurological:     Mental Status: She is alert and oriented to person, place, and time.     Cranial Nerves: No cranial nerve deficit.     Motor: No abnormal muscle tone.     Coordination: Coordination normal.     Gait: Gait normal.     Deep Tendon Reflexes: Reflexes are normal and symmetric.  Psychiatric:        Mood and Affect: Mood normal.        Behavior: Behavior normal. Behavior is cooperative.  Thought Content: Thought content normal.        Judgment: Judgment normal.       Assessment/Plan: 1. Encounter for health maintenance examination with abnormal findings Age-appropriate preventive  screenings and vaccinations discussed, annual physical exam completed. Routine labs for health maintenance done previously and discussed at a previous office visit.  PHM updated.   2. Type 2 diabetes mellitus with hyperglycemia, without long-term current use of insulin (HCC) A1c is stable, refills ordered.  Repeat A1c in 3 months. - POCT HgB A1C - Blood Glucose Monitoring Suppl (ONETOUCH VERIO FLEX SYSTEM) w/Device KIT  3. Essential (primary) hypertension Stable, refills ordered - bisoprolol (ZEBETA) 5 MG tablet; Take 1 tablet (5 mg total) by mouth daily.  Dispense: 90 tablet; Refill: 1 - chlorthalidone (HYGROTON) 25 MG tablet; Take 1 tablet (25 mg total) by mouth daily.  Dispense: 90 tablet; Refill: 1 - amLODipine (NORVASC) 10 MG tablet; Take 1 tablet (10 mg total) by mouth daily.  Dispense: 90 tablet; Refill: 1  4. Aortic atherosclerosis (HCC) Stable, refill ordered - rosuvastatin (CRESTOR) 5 MG tablet; Take 1 tablet (5 mg total) by mouth daily.  Dispense: 90 tablet; Refill: 1  5. Hypokalemia Refill ordered - potassium chloride SA (KLOR-CON M20) 20 MEQ tablet; Take 1 tablet (20 mEq total) by mouth 2 (two) times daily.  Dispense: 180 tablet; Refill: 1  6. Vitamin D deficiency, unspecified Stable, refill ordered - calcitRIOL (ROCALTROL) 0.25 MCG capsule; Take 1 capsule (0.25 mcg total) by mouth daily.  Dispense: 90 capsule; Refill: 5  7. Dysuria Routine urinalysis done. - UA/M w/rflx Culture, Routine - Microscopic Examination - Urine Culture, Reflex  8. Screening for colorectal cancer Refer to GI for routine colonoscopy - Ambulatory referral to Gastroenterology      General Counseling: katerin negrete understanding of the findings of todays visit and agrees with plan of treatment. I have discussed any further diagnostic evaluation that may be needed or ordered today. We also reviewed her medications today. she has been encouraged to call the office with any questions or  concerns that should arise related to todays visit.    Orders Placed This Encounter  Procedures   Microscopic Examination   Urine Culture, Reflex   UA/M w/rflx Culture, Routine   Ambulatory referral to Gastroenterology   POCT HgB A1C    Meds ordered this encounter  Medications   rosuvastatin (CRESTOR) 5 MG tablet    Sig: Take 1 tablet (5 mg total) by mouth daily.    Dispense:  90 tablet    Refill:  1   bisoprolol (ZEBETA) 5 MG tablet    Sig: Take 1 tablet (5 mg total) by mouth daily.    Dispense:  90 tablet    Refill:  1   chlorthalidone (HYGROTON) 25 MG tablet    Sig: Take 1 tablet (25 mg total) by mouth daily.    Dispense:  90 tablet    Refill:  1   potassium chloride SA (KLOR-CON M20) 20 MEQ tablet    Sig: Take 1 tablet (20 mEq total) by mouth 2 (two) times daily.    Dispense:  180 tablet    Refill:  1   amLODipine (NORVASC) 10 MG tablet    Sig: Take 1 tablet (10 mg total) by mouth daily.    Dispense:  90 tablet    Refill:  1   calcitRIOL (ROCALTROL) 0.25 MCG capsule    Sig: Take 1 capsule (0.25 mcg total) by mouth daily.    Dispense:  90 capsule    Refill:  5    Return in about 3 months (around 11/14/2021) for F/U, Recheck A1C, Edwing Figley PCP.   Total time spent:30 Minutes Time spent includes review of chart, medications, test results, and follow up plan with the patient.   [place] Controlled Substance Database was reviewed by me.  This patient was seen by Jonetta Osgood, FNP-C in collaboration with Dr. Clayborn Bigness as a part of collaborative care agreement.  Shai Rasmussen R. Valetta Fuller, MSN, FNP-C Internal medicine

## 2021-08-19 LAB — UA/M W/RFLX CULTURE, ROUTINE
Bilirubin, UA: NEGATIVE
Glucose, UA: NEGATIVE
Ketones, UA: NEGATIVE
Nitrite, UA: NEGATIVE
Protein,UA: NEGATIVE
RBC, UA: NEGATIVE
Specific Gravity, UA: 1.017 (ref 1.005–1.030)
Urobilinogen, Ur: 0.2 mg/dL (ref 0.2–1.0)
pH, UA: 6.5 (ref 5.0–7.5)

## 2021-08-19 LAB — MICROSCOPIC EXAMINATION
Bacteria, UA: NONE SEEN
Casts: NONE SEEN /lpf

## 2021-08-19 LAB — URINE CULTURE, REFLEX: Organism ID, Bacteria: NO GROWTH

## 2021-08-28 ENCOUNTER — Other Ambulatory Visit: Payer: Self-pay

## 2021-08-28 ENCOUNTER — Telehealth: Payer: Self-pay

## 2021-08-28 DIAGNOSIS — Z86010 Personal history of colonic polyps: Secondary | ICD-10-CM

## 2021-08-28 MED ORDER — NA SULFATE-K SULFATE-MG SULF 17.5-3.13-1.6 GM/177ML PO SOLN: 1.0000 | Freq: Once | ORAL | 0 refills | Status: AC

## 2021-08-28 NOTE — Progress Notes (Signed)
Gastroenterology Pre-Procedure Review  Request Date: 09/25/2021 Requesting Physician: Dr. Marius Ditch  PATIENT REVIEW QUESTIONS: The patient responded to the following health history questions as indicated:    1. Are you having any GI issues? no 2. Do you have a personal history of Polyps? yes (05/09/2011 polyps removed per patient.) 3. Do you have a family history of Colon Cancer or Polyps? no 4. Diabetes Mellitus? no 5. Joint replacements in the past 12 months?no 6. Major health problems in the past 3 months?no 7. Any artificial heart valves, MVP, or defibrillator?no    MEDICATIONS & ALLERGIES:    Patient reports the following regarding taking any anticoagulation/antiplatelet therapy:   Plavix, Coumadin, Eliquis, Xarelto, Lovenox, Pradaxa, Brilinta, or Effient? no Aspirin? no  Patient confirms/reports the following medications:  Current Outpatient Medications  Medication Sig Dispense Refill   Accu-Chek Softclix Lancets lancets Use as instructed to check blood sugars once daily 300 each 3   acetaminophen (TYLENOL) 500 MG tablet Take 2 tablets (1,000 mg total) by mouth every 6 (six) hours as needed. 60 tablet 0   amLODipine (NORVASC) 10 MG tablet Take 1 tablet (10 mg total) by mouth daily. 90 tablet 1   bisoprolol (ZEBETA) 5 MG tablet Take 1 tablet (5 mg total) by mouth daily. 90 tablet 1   Blood Glucose Monitoring Suppl (ONETOUCH VERIO FLEX SYSTEM) w/Device KIT      calcitRIOL (ROCALTROL) 0.25 MCG capsule Take 1 capsule (0.25 mcg total) by mouth daily. 90 capsule 5   chlorthalidone (HYGROTON) 25 MG tablet Take 1 tablet (25 mg total) by mouth daily. 90 tablet 1   diclofenac sodium (VOLTAREN) 1 % GEL Apply 2 g topically 4 (four) times daily as needed (knee pain).     glucose blood test strip Use as instructed to check blood sugars once daily E11.65 300 each 3   naproxen sodium (ALEVE) 220 MG tablet Take 440 mg by mouth 2 (two) times daily as needed (pain).     NON FORMULARY cpap device      potassium chloride SA (KLOR-CON M20) 20 MEQ tablet Take 1 tablet (20 mEq total) by mouth 2 (two) times daily. 180 tablet 1   rosuvastatin (CRESTOR) 5 MG tablet Take 1 tablet (5 mg total) by mouth daily. 90 tablet 1   No current facility-administered medications for this visit.    Patient confirms/reports the following allergies:  No Known Allergies  No orders of the defined types were placed in this encounter.   AUTHORIZATION INFORMATION Primary Insurance: 1D#: Group #:  Secondary Insurance: 1D#: Group #:  SCHEDULE INFORMATION: Date: 09/25/2021  Time: Location: [facility]

## 2021-08-28 NOTE — Telephone Encounter (Signed)
Inbound call from pt requesting to schedule her colonoscopy. Thank you

## 2021-08-28 NOTE — Telephone Encounter (Signed)
Procedure scheduled for 09/25/21.

## 2021-09-11 ENCOUNTER — Encounter: Payer: Self-pay | Admitting: Nurse Practitioner

## 2021-09-25 ENCOUNTER — Encounter: Payer: Self-pay | Admitting: Gastroenterology

## 2021-09-25 ENCOUNTER — Ambulatory Visit: Payer: Medicare HMO | Admitting: Certified Registered Nurse Anesthetist

## 2021-09-25 ENCOUNTER — Encounter
Admission: RE | Disposition: A | Discharge status: Home / Self Care | Payer: Self-pay | Source: Home / Self Care | Attending: Gastroenterology

## 2021-09-25 ENCOUNTER — Ambulatory Visit
Admission: RE | Admit: 2021-09-25 | Discharge: 2021-09-25 | Disposition: A | Discharge status: Home / Self Care | Payer: Medicare HMO | Attending: Gastroenterology | Admitting: Gastroenterology

## 2021-09-25 DIAGNOSIS — Z6841 Body Mass Index (BMI) 40.0 and over, adult: Secondary | ICD-10-CM | POA: Insufficient documentation

## 2021-09-25 DIAGNOSIS — K5730 Diverticulosis of large intestine without perforation or abscess without bleeding: Secondary | ICD-10-CM | POA: Diagnosis not present

## 2021-09-25 DIAGNOSIS — I10 Essential (primary) hypertension: Secondary | ICD-10-CM | POA: Diagnosis not present

## 2021-09-25 DIAGNOSIS — D120 Benign neoplasm of cecum: Secondary | ICD-10-CM | POA: Insufficient documentation

## 2021-09-25 DIAGNOSIS — Z1211 Encounter for screening for malignant neoplasm of colon: Secondary | ICD-10-CM | POA: Insufficient documentation

## 2021-09-25 DIAGNOSIS — Z860100 Personal history of colon polyps, unspecified: Secondary | ICD-10-CM

## 2021-09-25 DIAGNOSIS — E785 Hyperlipidemia, unspecified: Secondary | ICD-10-CM | POA: Insufficient documentation

## 2021-09-25 DIAGNOSIS — Z8616 Personal history of COVID-19: Secondary | ICD-10-CM | POA: Insufficient documentation

## 2021-09-25 DIAGNOSIS — E119 Type 2 diabetes mellitus without complications: Secondary | ICD-10-CM | POA: Diagnosis not present

## 2021-09-25 DIAGNOSIS — M1990 Unspecified osteoarthritis, unspecified site: Secondary | ICD-10-CM | POA: Diagnosis not present

## 2021-09-25 DIAGNOSIS — G4730 Sleep apnea, unspecified: Secondary | ICD-10-CM | POA: Diagnosis not present

## 2021-09-25 DIAGNOSIS — Z87891 Personal history of nicotine dependence: Secondary | ICD-10-CM | POA: Insufficient documentation

## 2021-09-25 HISTORY — PX: COLONOSCOPY WITH PROPOFOL: SHX5780

## 2021-09-25 SURGERY — COLONOSCOPY WITH PROPOFOL
Anesthesia: General

## 2021-09-25 MED ORDER — PROPOFOL 10 MG/ML IV BOLUS
INTRAVENOUS | Status: DC | PRN
  Administered 2021-09-25: 70 mg via INTRAVENOUS

## 2021-09-25 MED ORDER — PROPOFOL 500 MG/50ML IV EMUL
INTRAVENOUS | Status: AC
  Filled 2021-09-25: qty 50

## 2021-09-25 MED ORDER — SODIUM CHLORIDE 0.9 % IV SOLN
INTRAVENOUS | Status: DC
  Administered 2021-09-25: 1000 mL via INTRAVENOUS

## 2021-09-25 MED ORDER — LIDOCAINE HCL (CARDIAC) PF 100 MG/5ML IV SOSY
PREFILLED_SYRINGE | INTRAVENOUS | Status: DC | PRN
  Administered 2021-09-25: 50 mg via INTRAVENOUS

## 2021-09-25 MED ORDER — PROPOFOL 500 MG/50ML IV EMUL
INTRAVENOUS | Status: DC | PRN
  Administered 2021-09-25: 150 ug/kg/min via INTRAVENOUS

## 2021-09-25 MED ORDER — LIDOCAINE HCL (PF) 2 % IJ SOLN
INTRAMUSCULAR | Status: AC
  Filled 2021-09-25: qty 5

## 2021-09-25 NOTE — Anesthesia Procedure Notes (Signed)
Date/Time: 09/25/2021 11:08 AM Performed by: Johnna Acosta, CRNA Pre-anesthesia Checklist: Patient identified, Emergency Drugs available, Suction available, Patient being monitored and Timeout performed Patient Re-evaluated:Patient Re-evaluated prior to induction Oxygen Delivery Method: Nasal cannula Preoxygenation: Pre-oxygenation with 100% oxygen Induction Type: IV induction

## 2021-09-25 NOTE — Anesthesia Preprocedure Evaluation (Signed)
Anesthesia Evaluation  Patient identified by MRN, date of birth, ID band Patient awake    Reviewed: Allergy & Precautions, NPO status , Patient's Chart, lab work & pertinent test results  History of Anesthesia Complications Negative for: history of anesthetic complications  Airway Mallampati: III  TM Distance: >3 FB Neck ROM: Full    Dental no notable dental hx. (+) Teeth Intact   Pulmonary sleep apnea and Continuous Positive Airway Pressure Ventilation , neg COPD, Patient abstained from smoking.Not current smoker, former smoker,    Pulmonary exam normal breath sounds clear to auscultation       Cardiovascular Exercise Tolerance: Good METShypertension, (-) CAD and (-) Past MI (-) dysrhythmias  Rhythm:Regular Rate:Normal - Systolic murmurs    Neuro/Psych  Headaches, PSYCHIATRIC DISORDERS Anxiety    GI/Hepatic neg GERD  ,(+)     (-) substance abuse  ,   Endo/Other  diabetes, Well ControlledMorbid obesity  Renal/GU negative Renal ROS     Musculoskeletal   Abdominal (+) + obese,   Peds  Hematology   Anesthesia Other Findings Past Medical History: No date: Anxiety No date: Arthritis No date: Bruises easily 04/2019: COVID-19 No date: Diabetes mellitus without complication (HCC) No date: Headache No date: HLD (hyperlipidemia) No date: Hypertension No date: Leg pain No date: Pneumonia     Comment:  about 5 yrs ago No date: Sleep apnea  Reproductive/Obstetrics                             Anesthesia Physical Anesthesia Plan  ASA: 3  Anesthesia Plan: General   Post-op Pain Management: Minimal or no pain anticipated   Induction: Intravenous  PONV Risk Score and Plan: 3 and Propofol infusion, TIVA and Ondansetron  Airway Management Planned: Nasal Cannula and Nasal CPAP  Additional Equipment: None  Intra-op Plan:   Post-operative Plan:   Informed Consent: I have reviewed the  patients History and Physical, chart, labs and discussed the procedure including the risks, benefits and alternatives for the proposed anesthesia with the patient or authorized representative who has indicated his/her understanding and acceptance.     Dental advisory given  Plan Discussed with: CRNA and Surgeon  Anesthesia Plan Comments: (Discussed risks of anesthesia with patient, including possibility of difficulty with spontaneous ventilation under anesthesia necessitating airway intervention, PONV, and rare risks such as cardiac or respiratory or neurological events, and allergic reactions. Discussed the role of CRNA in patient's perioperative care. Patient understands.)        Anesthesia Quick Evaluation

## 2021-09-25 NOTE — Op Note (Signed)
[facility] Patient Name: Jill Banks Procedure Date: 09/25/2021 11:03 AM MRN: [id number] Account #: [id number] Date of Birth: [birth year] Admit Type: Outpatient Age: 66 Room: [facility] ENDO ROOM 3 Gender: Female Note Status: Finalized Instrument Name: Park Meo [id number] Procedure:             Colonoscopy Indications:           Screening for colorectal malignant neoplasm, Last                         colonoscopy 10 years ago Providers:             Lin Landsman MD, MD Referring MD:          Lavera Guise, MD (Referring MD) Medicines:             General Anesthesia Complications:         No immediate complications. Estimated blood loss: None. Procedure:             Pre-Anesthesia Assessment:                        - Prior to the procedure, a History and Physical was                         performed, and patient medications and allergies were                         reviewed. The patient is competent. The risks and                         benefits of the procedure and the sedation options and                         risks were discussed with the patient. All questions                         were answered and informed consent was obtained.                         Patient identification and proposed procedure were                         verified by the physician, the nurse, the                         anesthesiologist, the anesthetist and the technician                         in the pre-procedure area in the procedure room in the                         endoscopy suite. Mental Status Examination: alert and                         oriented. Airway Examination: normal oropharyngeal                         airway and neck mobility. Respiratory Examination:  clear to auscultation. CV Examination: normal.                         Prophylactic Antibiotics: The patient does not require                         prophylactic  antibiotics. Prior Anticoagulants: The                         patient has taken no previous anticoagulant or                         antiplatelet agents. ASA Grade Assessment: III - A                         patient with severe systemic disease. After reviewing                         the risks and benefits, the patient was deemed in                         satisfactory condition to undergo the procedure. The                         anesthesia plan was to use general anesthesia.                         Immediately prior to administration of medications,                         the patient was re-assessed for adequacy to receive                         sedatives. The heart rate, respiratory rate, oxygen                         saturations, blood pressure, adequacy of pulmonary                         ventilation, and response to care were monitored                         throughout the procedure. The physical status of the                         patient was re-assessed after the procedure.                        After obtaining informed consent, the colonoscope was                         passed under direct vision. Throughout the procedure,                         the patient's blood pressure, pulse, and oxygen                         saturations were monitored continuously. The  Colonoscope was introduced through the anus and                         advanced to the the cecum, identified by appendiceal                         orifice and ileocecal valve. The colonoscopy was                         performed with moderate difficulty due to significant                         looping and the patient's body habitus. Successful                         completion of the procedure was aided by applying                         abdominal pressure. The patient tolerated the                         procedure well. The quality of the bowel preparation                          was evaluated using the BBPS [facility] Bowel Preparation                         Scale) with scores of: Right Colon = 3, Transverse                         Colon = 3 and Left Colon = 3 (entire mucosa seen well                         with no residual staining, small fragments of stool or                         opaque liquid). The total BBPS score equals 9. Findings:      The perianal and digital rectal examinations were normal. Pertinent       negatives include normal sphincter tone and no palpable rectal lesions.      A 3 mm polyp was found in the cecum. The polyp was sessile. The polyp       was removed with a jumbo cold forceps. Resection and retrieval were       complete. Estimated blood loss: none.      Multiple small and large-mouthed diverticula were found in the entire       colon. There was no evidence of diverticular bleeding.      The retroflexed view of the distal rectum and anal verge was normal and       showed no anal or rectal abnormalities.      The exam was otherwise without abnormality. Impression:            - One 3 mm polyp in the cecum, removed with a jumbo                         cold forceps. Resected and retrieved.                        -  Severe diverticulosis in the entire examined colon.                         There was no evidence of diverticular bleeding.                        - The distal rectum and anal verge are normal on                         retroflexion view.                        - The examination was otherwise normal. Recommendation:        - Discharge patient to home (with escort).                        - Resume previous diet today.                        - Continue present medications.                        - Await pathology results.                        - Repeat colonoscopy in 7 years for surveillance. Procedure Code(s):     --- Professional ---                        [phone], Colonoscopy, flexible; with biopsy, single or                          multiple Diagnosis Code(s):     --- Professional ---                        Z12.11, Encounter for screening for malignant neoplasm                         of colon                        K63.5, Polyp of colon                        K57.30, Diverticulosis of large intestine without                         perforation or abscess without bleeding CPT copyright 2019 American Medical Association. All rights reserved. The codes documented in this report are preliminary and upon coder review may  be revised to meet current compliance requirements. Dr. Ulyess Mort Lin Landsman MD, MD 09/25/2021 11:29:50 AM This report has been signed electronically. Number of Addenda: 0 Note Initiated On: 09/25/2021 11:03 AM Scope Withdrawal Time: 0 hours 8 minutes 25 seconds  Total Procedure Duration: 0 hours 11 minutes 10 seconds  Estimated Blood Loss:  Estimated blood loss: none.      [facility]

## 2021-09-25 NOTE — Transfer of Care (Signed)
Immediate Anesthesia Transfer of Care Note  Patient: Jill Banks  Procedure(s) Performed: COLONOSCOPY WITH PROPOFOL  Patient Location: PACU  Anesthesia Type:General  Level of Consciousness: awake, alert  and oriented  Airway & Oxygen Therapy: Patient Spontanous Breathing  Post-op Assessment: Report given to RN and Post -op Vital signs reviewed and stable  Post vital signs: Reviewed and stable  Last Vitals:  Vitals Value Taken Time  BP 123/95 09/25/21 1138  Temp 35.6 C 09/25/21 1130  Pulse 73 09/25/21 1138  Resp 15 09/25/21 1138  SpO2 98 % 09/25/21 1138    Last Pain:  Vitals:   09/25/21 1130  TempSrc: Temporal  PainSc:          Complications: No notable events documented.

## 2021-09-25 NOTE — H&P (Signed)
Cephas Darby, MD [address]  [place]  [place], [place] [address]  Main: [phone]  Fax: [phone] Pager: [phone]  Primary Care Physician:  Lavera Guise, MD Primary Gastroenterologist:  Dr. Cephas Darby  Pre-Procedure History & Physical: HPI:  Jill Banks is a 65 y.o. female is here for an colonoscopy.   Past Medical History:  Diagnosis Date   Anxiety    Arthritis    Bruises easily    COVID-19 04/2019   Diabetes mellitus without complication ([place])    Headache    HLD (hyperlipidemia)    Hypertension    Leg pain    Pneumonia    about 5 yrs ago   Sleep apnea     Past Surgical History:  Procedure Laterality Date   APPLICATION OF WOUND VAC 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   Procedure: APPLICATION OF WOUND VAC;  Surgeon: Hessie Knows, MD;  Location: [facility] ORS;  Service: Orthopedics;  Laterality: Left;  #[id number]   DILATATION & CURETTAGE/HYSTEROSCOPY WITH MYOSURE  10/17/2020   Procedure: DILATATION & CURETTAGE/HYSTEROSCOPY WITH MYOSURE POLYPECTOMY;  Surgeon: Homero Fellers, MD;  Location: [facility] ORS;  Service: Gynecology;;   DILATION AND CURETTAGE OF UTERUS     x 3   TOE SURGERY Left    second toe   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 Authorizing Provider  Accu-Chek Softclix Lancets lancets Use as instructed to check blood sugars once daily 05/25/21  Yes Abernathy, Alyssa, NP  amLODipine (NORVASC) 10 MG tablet Take 1 tablet (10 mg total) by mouth daily. 08/16/21  Yes Abernathy, Yetta Flock, NP  bisoprolol (ZEBETA) 5 MG tablet Take 1 tablet (5 mg total) by mouth daily. 08/16/21  Yes Abernathy, Yetta Flock, NP  Blood Glucose Monitoring Suppl (ONETOUCH VERIO FLEX SYSTEM) w/Device KIT  05/25/21  Yes [provider]  calcitRIOL (ROCALTROL) 0.25 MCG capsule  Take 1 capsule (0.25 mcg total) by mouth daily. 08/16/21  Yes Abernathy, Yetta Flock, NP  chlorthalidone (HYGROTON) 25 MG tablet Take 1 tablet (25 mg total) by mouth daily. 08/16/21  Yes Abernathy, Yetta Flock, NP  diclofenac sodium (VOLTAREN) 1 % GEL Apply 2 g topically 4 (four) times daily as needed (knee pain).   Yes [provider]  glucose blood test strip Use as instructed to check blood sugars once daily E11.65 05/25/21  Yes Abernathy, Alyssa, NP  naproxen sodium (ALEVE) 220 MG tablet Take 440 mg by mouth 2 (two) times daily as needed (pain).   Yes [provider]  NON FORMULARY cpap device   Yes [provider]  potassium chloride SA (KLOR-CON M20) 20 MEQ tablet Take 1 tablet (20 mEq total) by mouth 2 (two) times daily. 08/16/21  Yes Abernathy, Yetta Flock, NP  acetaminophen (TYLENOL) 500 MG tablet Take 2 tablets (1,000 mg total) by mouth every 6 (six) hours as needed. 10/17/20 10/17/21  Schuman, Stefanie Libel, MD  rosuvastatin (CRESTOR) 5 MG tablet Take 1 tablet (5 mg total) by mouth daily. Patient not taking: Reported on 09/25/2021 08/16/21   Jonetta Osgood, NP    Allergies as of 08/28/2021   (No Known Allergies)    Family History  Problem Relation Age of Onset   Hypertension Father    Stroke Father    Hypertension Brother    Stroke Brother    Hyperlipidemia  Brother    Asthma Son     Social History   Socioeconomic History   Marital status: Married    Spouse name: Not on file   Number of children: Not on file   Years of education: Not on file   Highest education level: Not on file  Occupational History   Not on file  Tobacco Use   Smoking status: Former   Smokeless tobacco: Never  Vaping Use   Vaping Use: Never used  Substance and Sexual Activity   Alcohol use: Yes    Comment: ocassionally   Drug use: No   Sexual activity: Yes  Other Topics Concern   Not on file  Social History Narrative   Not on file   Social Determinants of Health   Financial  Resource Strain: Not on file  Food Insecurity: Not on file  Transportation Needs: Not on file  Physical Activity: Not on file  Stress: Not on file  Social Connections: Not on file  Intimate Partner Violence: Not on file    Review of Systems: See HPI, otherwise negative ROS  Physical Exam: BP (!) 161/87    Pulse (!) 56    Temp (!) 96 F (35.6 C) (Temporal)    Resp 18    Ht 5' 6"  (1.676 m)    Wt 115.5 kg    SpO2 99%    BMI 41.10 kg/m  General:   Alert,  pleasant and cooperative in NAD Head:  Normocephalic and atraumatic. Neck:  Supple; no masses or thyromegaly. Lungs:  Clear throughout to auscultation.    Heart:  Regular rate and rhythm. Abdomen:  Soft, nontender and nondistended. Normal bowel sounds, without guarding, and without rebound.   Neurologic:  Alert and  oriented x4;  grossly normal neurologically.  Impression/Plan: Jill Banks is here for an colonoscopy to be performed for colon cancer screening  Risks, benefits, limitations, and alternatives regarding  colonoscopy have been reviewed with the patient.  Questions have been answered.  All parties agreeable.   Sherri Sear, MD  09/25/2021, 10:52 AM

## 2021-09-25 NOTE — Anesthesia Postprocedure Evaluation (Signed)
Anesthesia Post Note  Patient: Jill Banks  Procedure(s) Performed: COLONOSCOPY WITH PROPOFOL  Patient location during evaluation: Endoscopy Anesthesia Type: General Level of consciousness: awake and alert Pain management: pain level controlled Vital Signs Assessment: post-procedure vital signs reviewed and stable Respiratory status: spontaneous breathing, nonlabored ventilation, respiratory function stable and patient connected to nasal cannula oxygen Cardiovascular status: blood pressure returned to baseline and stable Postop Assessment: no apparent nausea or vomiting Anesthetic complications: no   No notable events documented.   Last Vitals:  Vitals:   09/25/21 1140 09/25/21 1150  BP: (!) 144/84 (!) 158/87  Pulse: 71 66  Resp: 19 15  Temp:    SpO2: 100% 98%    Last Pain:  Vitals:   09/25/21 1150  TempSrc:   PainSc: 0-No pain                 Arita Miss

## 2021-09-26 ENCOUNTER — Encounter: Payer: Self-pay | Admitting: Gastroenterology

## 2021-09-26 LAB — SURGICAL PATHOLOGY

## 2021-10-10 ENCOUNTER — Ambulatory Visit: Payer: Medicare HMO | Admitting: Podiatry

## 2021-10-10 ENCOUNTER — Other Ambulatory Visit: Payer: Self-pay

## 2021-10-10 DIAGNOSIS — M76821 Posterior tibial tendinitis, right leg: Secondary | ICD-10-CM | POA: Diagnosis not present

## 2021-10-10 NOTE — Progress Notes (Signed)
°  Subjective:  Patient ID: Jill Banks, female    DOB: 10-Feb-1956,   MRN: [id number]  Chief Complaint  Patient presents with   Foot Problem    Pt is having pain on BIL feet. Has has this problem in the past. Elevation and Aleve helps sometimes    66 y.o. female presents for bilateral foot pain and flat feet. Relates it is primarily the right foot that gives her trouble. Relates she has tried boots and shoes and things to help without much relief. Takes anti-inflammatories but doesn't want to be on long term. States she was unable to afford custom inserts in the past.  . Denies any other pedal complaints. Denies n/v/f/c.   Past Medical History:  Diagnosis Date   Anxiety    Arthritis    Bruises easily    COVID-19 04/2019   Diabetes mellitus without complication (HCC)    Headache    HLD (hyperlipidemia)    Hypertension    Leg pain    Pneumonia    about 5 yrs ago   Sleep apnea     Objective:  Physical Exam: Vascular: DP/PT pulses 2/4 bilateral. CFT <3 seconds. Normal hair growth on digits. No edema.  Skin. No lacerations or abrasions bilateral feet.  Musculoskeletal: MMT 5/5 bilateral lower extremities in DF, PF, Inversion and Eversion. Deceased ROM in DF of ankle joint. Tender along PT tendon. And lateral ankle. Pes planus noted with edema to lateral ankle. Increased eversion.  Neurological: Sensation intact to light touch.   Assessment:   1. Posterior tibial tendon dysfunction (PTTD) of right lower extremity      Plan:  Patient was evaluated and treated and all questions answered. X-rays reviewed and discussed with patient. Discussed PTTD diagnosis and treatment options with patient. Stretching exercises discussed and handout dispensed. Continue with voltaren and OTC meds.  Tri-Lock ankle brace dispensed. Powersteps dispensed.  Discussed if there is no improvement PT/MRI/injection may be an option. Patient to return to clinic in 6 to 8 weeks or sooner if symptoms fail  to improve or worsen.   Lorenda Peck, DPM

## 2021-10-23 ENCOUNTER — Encounter: Payer: Self-pay | Admitting: Internal Medicine

## 2021-10-23 ENCOUNTER — Other Ambulatory Visit: Payer: Self-pay

## 2021-10-23 ENCOUNTER — Ambulatory Visit: Payer: Medicare HMO | Admitting: Internal Medicine

## 2021-10-23 VITALS — BP 158/84 | HR 64 | Temp 98.1°F | Resp 16 | Ht 66.5 in | Wt 262.2 lb

## 2021-10-23 DIAGNOSIS — G4733 Obstructive sleep apnea (adult) (pediatric): Secondary | ICD-10-CM | POA: Diagnosis not present

## 2021-10-23 DIAGNOSIS — Z7189 Other specified counseling: Secondary | ICD-10-CM

## 2021-10-23 NOTE — Progress Notes (Signed)
[facility] [place], [place] [address]  Pulmonary Sleep Medicine   Office Visit Note  Patient Name: Jill Banks DOB: 18-Apr-1956 MRN [id number]  Date of Service: 10/23/2021  Complaints/HPI: OSA on CPAP. She is using the CPAP as ordered. She states she is tired of using the device. Unfortunately she has gained alittle bit of weight problem. She has actually worked on it but is now trying to [facility] [employer] [facility] help with water aerob[facility].  Patient was encouraged to stay active and to work on weight loss.  Her BMI was 42.5 and I discussed with her the importance of trending down and losing weight.  ROS  General: (-) fever, (-) chills, (-) night sweats, (-) weakness Skin: (-) rashes, (-) itching,. Eyes: (-) visual changes, (-) redness, (-) itching. Nose and Sinuses: (-) nasal stuffiness or itchiness, (-) postnasal drip, (-) nosebleeds, (-) sinus trouble. Mouth and Throat: (-) sore throat, (-) hoarseness. Neck: (-) swollen glands, (-) enlarged thyroid, (-) neck pain. Respiratory: - cough, (-) bloody sputum, - shortness of breath, - wheezing. Cardiovascular: - ankle swelling, (-) chest pain. Lymphatic: (-) lymph node enlargement. Neurologic: (-) numbness, (-) tingling. Psychiatric: (-) anxiety, (-) depression   Current Medication: Outpatient Encounter Medications as of 10/23/2021  Medication Sig   Accu-Chek Softclix Lancets lancets Use as instructed to check blood sugars once daily   amLODipine (NORVASC) 10 MG tablet Take 1 tablet (10 mg total) by mouth daily.   bisoprolol (ZEBETA) 5 MG tablet Take 1 tablet (5 mg total) by mouth daily.   Blood Glucose Monitoring Suppl (ONETOUCH VERIO FLEX SYSTEM) w/Device KIT    calcitRIOL (ROCALTROL) 0.25 MCG capsule Take 1 capsule (0.25 mcg total) by mouth daily.   chlorthalidone (HYGROTON) 25 MG tablet Take 1 tablet (25 mg total) by mouth daily.   diclofenac sodium (VOLTAREN) 1 % GEL Apply 2 g topically 4 (four) times daily as  needed (knee pain).   glucose blood test strip Use as instructed to check blood sugars once daily E11.65   Na Sulfate-K Sulfate-Mg Sulf 17.5-3.13-1.6 GM/177ML SOLN Take by mouth.   naproxen sodium (ALEVE) 220 MG tablet Take 440 mg by mouth 2 (two) times daily as needed (pain).   NON FORMULARY cpap device   potassium chloride SA (KLOR-CON M20) 20 MEQ tablet Take 1 tablet (20 mEq total) by mouth 2 (two) times daily.   rosuvastatin (CRESTOR) 5 MG tablet Take 1 tablet (5 mg total) by mouth daily.   No facility-administered encounter medications on file as of 10/23/2021.    Surgical History: Past Surgical History:  Procedure Laterality Date   APPLICATION OF WOUND VAC 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   Procedure: APPLICATION OF WOUND VAC;  Surgeon: Hessie Knows, MD;  Location: [facility] ORS;  Service: Orthopedics;  Laterality: Left;  #[id number]   COLONOSCOPY WITH PROPOFOL N/A 09/25/2021   Procedure: COLONOSCOPY WITH PROPOFOL;  Surgeon: Lin Landsman, MD;  Location: [facility] ENDOSCOPY;  Service: Gastroenterology;  Laterality: N/A;   DILATATION & CURETTAGE/HYSTEROSCOPY WITH MYOSURE  10/17/2020   Procedure: DILATATION & CURETTAGE/HYSTEROSCOPY WITH MYOSURE POLYPECTOMY;  Surgeon: Homero Fellers, MD;  Location: [facility] ORS;  Service: Gynecology;;   DILATION AND CURETTAGE OF UTERUS     x 3   TOE SURGERY Left    second toe   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   Procedure: LEFT TOTAL KNEE ARTHROPLASTY;  Surgeon: Hessie Knows, MD;  Location: [facility] ORS;  Service: Orthopedics;  Laterality: Left;    Medical History: Past Medical  History:  Diagnosis Date   Anxiety    Arthritis    Bruises easily    COVID-19 04/2019   Diabetes mellitus without complication (HCC)    Headache    HLD (hyperlipidemia)    Hypertension    Leg pain    Pneumonia    about 5 yrs ago   Sleep apnea     Family History: Family History  Problem Relation Age of Onset   Hypertension Father    Stroke Father    Hypertension Brother     Stroke Brother    Hyperlipidemia Brother    Asthma Son     Social History: Social History   Socioeconomic History   Marital status: Married    Spouse name: Not on file   Number of children: Not on file   Years of education: Not on file   Highest education level: Not on file  Occupational History   Not on file  Tobacco Use   Smoking status: Former   Smokeless tobacco: Never  Vaping Use   Vaping Use: Never used  Substance and Sexual Activity   Alcohol use: Yes    Comment: ocassionally   Drug use: No   Sexual activity: Yes  Other Topics Concern   Not on file  Social History Narrative   Not on file   Social Determinants of Health   Financial Resource Strain: Not on file  Food Insecurity: Not on file  Transportation Needs: Not on file  Physical Activity: Not on file  Stress: Not on file  Social Connections: Not on file  Intimate Partner Violence: Not on file    Vital Signs: Blood pressure (!) 158/84, pulse 64, temperature 98.1 F (36.7 C), resp. rate 16, height 5' 6.5" (1.689 m), weight 262 lb 3.2 oz (118.9 kg), SpO2 97 %.  Examination: General Appearance: The patient is well-developed, well-nourished, and in no distress. Skin: Gross inspection of skin unremarkable. Head: normocephalic, no gross deformities. Eyes: no gross deformities noted. ENT: ears appear grossly normal no exudates. Neck: Supple. No thyromegaly. No LAD. Respiratory: no rhonchi noted at this time. Cardiovascular: Normal S1 and S2 without murmur or rub. Extremities: No cyanosis. pulses are equal. Neurologic: Alert and oriented. No involuntary movements.  LABS: Recent Results (from the past 2160 hour(s))  POCT HgB A1C     Status: Abnormal   Collection Time: 08/16/21 10:34 AM  Result Value Ref Range   Hemoglobin A1C 6.1 (A) 4.0 - 5.6 %   HbA1c POC (<> result, manual entry)     HbA1c, POC (prediabetic range)     HbA1c, POC (controlled diabetic range)    UA/M w/rflx Culture, Routine      Status: Abnormal   Collection Time: 08/16/21 10:35 AM   Specimen: Urine   Urine  Result Value Ref Range   Specific Gravity, UA 1.017 1.005 - 1.030   pH, UA 6.5 5.0 - 7.5   Color, UA Yellow Yellow   Appearance Ur Clear Clear   Leukocytes,UA 1+ (A) Negative   Protein,UA Negative Negative/Trace   Glucose, UA Negative Negative   Ketones, UA Negative Negative   RBC, UA Negative Negative   Bilirubin, UA Negative Negative   Urobilinogen, Ur 0.2 0.2 - 1.0 mg/dL   Nitrite, UA Negative Negative   Microscopic Examination See below:     Comment: Microscopic was indicated and was performed.   Urinalysis Reflex Comment     Comment: This specimen has reflexed to a Urine Culture.  Microscopic Examination  Status: None   Collection Time: 08/16/21 10:35 AM   Urine  Result Value Ref Range   WBC, UA 0-5 0 - 5 /hpf   RBC 0-2 0 - 2 /hpf   Epithelial Cells (non renal) 0-10 0 - 10 /hpf   Casts None seen None seen /lpf   Bacteria, UA None seen None seen/Few  Urine Culture, Reflex     Status: None   Collection Time: 08/16/21 10:35 AM   Urine  Result Value Ref Range   Urine Culture, Routine Final report    Organism ID, Bacteria No growth   Surgical pathology     Status: None   Collection Time: 09/25/21 11:22 AM  Result Value Ref Range   SURGICAL PATHOLOGY      SURGICAL PATHOLOGY CASE: [id number] PATIENT: Leith Borton Surgical Pathology Report     Specimen Submitted: A. Colon polyp, cecum; cbx  Clinical History: History of colonic polyps Z86.010.  Diverticulosis, colon polyp      DIAGNOSIS: A. COLON POLYP, CECUM; COLD BIOPSY: - TUBULAR ADENOMA. - NEGATIVE FOR HIGH-GRADE DYSPLASIA AND MALIGNANCY.  GROSS DESCRIPTION: A. Labeled: Cecum colon polyp cbx Received: Formalin Collection time: 11:22 AM on 09/25/2021 Placed into formalin time: 11:22 AM on 09/25/2021 Tissue fragment(s): 1 Size: 0.3 x 0.2 x 0.1 cm Description: Tan soft tissue fragments Entirely submitted in 1  cassette.  [facility] 09/25/2021  Final Diagnosis performed by Quay Burow, MD.   Electronically signed 09/26/2021 12:41:53PM The electronic signature indicates that the named Attending Pathologist has evaluated the specimen Technical component performed at [facility], [address] [place], [place] [address] Lab: [phone] Dir: Rush Farmer, MD, MMM   Professional component performed at [facility], [facility], [place], [place], [place] [address] Lab: [phone] Dir: Kathi Simpers, MD     Radiology: No results found.  No results found.  No results found.    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   Encounter for general adult medical examination with abnormal findings 07/01/2019   Adjustment insomnia 07/01/2019   Encounter for screening mammogram for malignant neoplasm of breast 07/01/2019   Routine cervical smear 07/01/2019   Ventral hernia without obstruction or gangrene 07/01/2019   Pain and swelling of ankle, right 01/13/2019   Acute vaginitis 01/11/2018   Bacterial vaginitis 12/18/2017   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 OSA (obstructive sleep apnea) Doing fine with CPAP therapy plan is going to be to continue with current pressure settings.  2. Obesity, morbid ([place]) Obesity Counseling: Had a lengthy discussion regarding patients BMI and weight issues. Patient was instructed on portion control as well  as increased activity. Also discussed caloric restrictions with trying to maintain intake less than 2000 Kcal. Discussions were made in accordance with the 5As of weight management. Simple actions such as not eating late and if able to, taking a walk is suggested.   3. CPAP use counseling CPAP Counseling: had a lengthy discussion with the patient regarding the importance of PAP therapy in management of the sleep apnea. Patient appears to understand the risk factor reduction and also understands the risks associated with untreated sleep apnea. Patient will try to make a good faith effort to remain compliant with therapy. Also instructed the patient on proper cleaning of the device including the water must be changed daily if possible and use of distilled water is preferred. Patient understands that the machine should be regularly cleaned with appropriate recommended cleaning solutions that do not damage the PAP machine for example given white vinegar and water rinses. Other methods such as ozone treatment may not be as good as these simple methods to achieve cleaning.    General Counseling: I have discussed the findings of the evaluation and examination with Rodena Piety.  I have also discussed any further diagnostic evaluation thatmay be needed or ordered today. Attie verbalizes understanding of the findings of todays visit. We also reviewed her medications today and discussed drug interactions and side effects including but not limited excessive drowsiness and altered mental states. We also discussed that there is always a risk not just to her but also  people around her. she has been encouraged to call the office with any questions or concerns that should arise related to todays visit.  No orders of the defined types were placed in this encounter.    Time spent: 36  I have personally obtained a history, examined the patient, evaluated laboratory and imaging results, formulated the assessment and plan and placed orders.    Allyne Gee, MD [facility] Pulmonary and Critical Care Sleep medicine

## 2021-10-23 NOTE — Patient Instructions (Signed)
Sleep Apnea ?Sleep apnea affects breathing during sleep. It causes breathing to stop for 10 seconds or more, or to become shallow. People with sleep apnea usually snore loudly. ?It can also increase the risk of: ?Heart attack. ?Stroke. ?Being very overweight (obese). ?Diabetes. ?Heart failure. ?Irregular heartbeat. ?High blood pressure. ?The goal of treatment is to help you breathe normally again. ?What are the causes? ?The most common cause of this condition is a collapsed or blocked airway. ?There are three kinds of sleep apnea: ?Obstructive sleep apnea. This is caused by a blocked or collapsed airway. ?Central sleep apnea. This happens when the brain does not send the right signals to the muscles that control breathing. ?Mixed sleep apnea. This is a combination of obstructive and central sleep apnea. ?What increases the risk? ?Being overweight. ?Smoking. ?Having a small airway. ?Being older. ?Being female. ?Drinking alcohol. ?Taking medicines to calm yourself (sedatives or tranquilizers). ?Having family members with the condition. ?Having a tongue or tonsils that are larger than normal. ?What are the signs or symptoms? ?Trouble staying asleep. ?Loud snoring. ?Headaches in the morning. ?Waking up gasping. ?Dry mouth or sore throat in the morning. ?Being sleepy or tired during the day. ?If you are sleepy or tired during the day, you may also: ?Not be able to focus your mind (concentrate). ?Forget things. ?Get angry a lot and have mood swings. ?Feel sad (depressed). ?Have changes in your personality. ?Have less interest in sex, if you are female. ?Be unable to have an erection, if you are female. ?How is this treated? ? ?Sleeping on your side. ?Using a medicine to get rid of mucus in your nose (decongestant). ?Avoiding the use of alcohol, medicines to help you relax, or certain pain medicines (narcotics). ?Losing weight, if needed. ?Changing your diet. ?Quitting smoking. ?Using a machine to open your airway while you  sleep, such as: ?An oral appliance. This is a mouthpiece that shifts your lower jaw forward. ?A CPAP device. This device blows air through a mask when you breathe out (exhale). ?An EPAP device. This has valves that you put in each nostril. ?A BIPAP device. This device blows air through a mask when you breathe in (inhale) and breathe out. ?Having surgery if other treatments do not work. ?Follow these instructions at home: ?Lifestyle ?Make changes that your doctor recommends. ?Eat a healthy diet. ?Lose weight if needed. ?Avoid alcohol, medicines to help you relax, and some pain medicines. ?Do not smoke or use any products that contain nicotine or tobacco. If you need help quitting, ask your doctor. ?General instructions ?Take over-the-counter and prescription medicines only as told by your doctor. ?If you were given a machine to use while you sleep, use it only as told by your doctor. ?If you are having surgery, make sure to tell your doctor you have sleep apnea. You may need to bring your device with you. ?Keep all follow-up visits. ?Contact a doctor if: ?The machine that you were given to use during sleep bothers you or does not seem to be working. ?You do not get better. ?You get worse. ?Get help right away if: ?Your chest hurts. ?You have trouble breathing in enough air. ?You have an uncomfortable feeling in your back, arms, or stomach. ?You have trouble talking. ?One side of your body feels weak. ?A part of your face is hanging down. ?These symptoms may be an emergency. Get help right away. Call your local emergency services (911 in the [place] ?Do not wait to see if the symptoms   will go away. ?Do not drive yourself to the hospital. ?Summary ?This condition affects breathing during sleep. ?The most common cause is a collapsed or blocked airway. ?The goal of treatment is to help you breathe normally while you sleep. ?This information is not intended to replace advice given to you by your health care provider. Make  sure you discuss any questions you have with your health care provider. ?Document Revised: 04/11/2021 Document Reviewed: 08/11/2020 ?Elsevier Patient Education ? 2022 [employer] ? ?

## 2021-11-15 ENCOUNTER — Ambulatory Visit: Payer: Medicare HMO | Admitting: Nurse Practitioner

## 2021-11-19 ENCOUNTER — Other Ambulatory Visit: Payer: Self-pay

## 2021-11-19 ENCOUNTER — Encounter: Payer: Self-pay | Admitting: Nurse Practitioner

## 2021-11-19 ENCOUNTER — Ambulatory Visit: Payer: Medicare HMO | Admitting: Nurse Practitioner

## 2021-11-19 VITALS — BP 124/86 | HR 52 | Temp 98.1°F | Resp 16 | Ht 66.0 in | Wt 263.6 lb

## 2021-11-19 DIAGNOSIS — E1165 Type 2 diabetes mellitus with hyperglycemia: Secondary | ICD-10-CM

## 2021-11-19 DIAGNOSIS — M25561 Pain in right knee: Secondary | ICD-10-CM | POA: Diagnosis not present

## 2021-11-19 DIAGNOSIS — Z6841 Body Mass Index (BMI) 40.0 and over, adult: Secondary | ICD-10-CM

## 2021-11-19 DIAGNOSIS — M25571 Pain in right ankle and joints of right foot: Secondary | ICD-10-CM | POA: Diagnosis not present

## 2021-11-19 DIAGNOSIS — G8929 Other chronic pain: Secondary | ICD-10-CM

## 2021-11-19 DIAGNOSIS — I10 Essential (primary) hypertension: Secondary | ICD-10-CM | POA: Diagnosis not present

## 2021-11-19 LAB — POCT GLYCOSYLATED HEMOGLOBIN (HGB A1C): Hemoglobin A1C: 6.2 % — AB (ref 4.0–5.6)

## 2021-11-19 NOTE — Progress Notes (Cosign Needed)
[facility] [place], [place] [address]  Internal MEDICINE  Office Visit Note  Patient Name: Jill Banks  [id number]  [id number]  Date of Service: 11/19/2021  Chief Complaint  Patient presents with   Follow-up   Diabetes   Hyperlipidemia   Hypertension   Anxiety   Weight Loss    HPI Jill Banks presents for a follow-up visit for diabetes, hypertension, anxiety and weight loss.  Her A1c is 6.2 today which is stable and consistent with her previous A1c of 6.1.  Patient continues to be diet controlled and is not on any diabetic medications at this time.  Her blood pressure is stable and well-controlled today with current medications.  She is frustrated with trying to lose weight.  She reports that she has not been successful in losing weight but she also has not gained any significant weight.  She has applied to go to the [facility] and do water aerobics and other exercise classes.  Patient has arthritis in her knee and ankle which has hindered her ability to do a lot of physical activity.  She is looking forward to doing water aerobics and other water based exercises. Anxiety is controlled.   Current Medication: Outpatient Encounter Medications as of 11/19/2021  Medication Sig   Accu-Chek Softclix Lancets lancets Use as instructed to check blood sugars once daily   amLODipine (NORVASC) 10 MG tablet Take 1 tablet (10 mg total) by mouth daily.   bisoprolol (ZEBETA) 5 MG tablet Take 1 tablet (5 mg total) by mouth daily.   Blood Glucose Monitoring Suppl (ONETOUCH VERIO FLEX SYSTEM) w/Device KIT    calcitRIOL (ROCALTROL) 0.25 MCG capsule Take 1 capsule (0.25 mcg total) by mouth daily.   chlorthalidone (HYGROTON) 25 MG tablet Take 1 tablet (25 mg total) by mouth daily.   diclofenac sodium (VOLTAREN) 1 % GEL Apply 2 g topically 4 (four) times daily as needed (knee pain).   glucose blood test strip Use as instructed to check blood sugars once daily E11.65   naproxen sodium  (ALEVE) 220 MG tablet Take 440 mg by mouth 2 (two) times daily as needed (pain).   NON FORMULARY cpap device   potassium chloride SA (KLOR-CON M20) 20 MEQ tablet Take 1 tablet (20 mEq total) by mouth 2 (two) times daily.   rosuvastatin (CRESTOR) 5 MG tablet Take 1 tablet (5 mg total) by mouth daily.   [DISCONTINUED] Na Sulfate-K Sulfate-Mg Sulf 17.5-3.13-1.6 GM/177ML SOLN Take by mouth. (Patient not taking: Reported on 11/19/2021)   No facility-administered encounter medications on file as of 11/19/2021.    Surgical History: Past Surgical History:  Procedure Laterality Date   APPLICATION OF WOUND VAC 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   Procedure: APPLICATION OF WOUND VAC;  Surgeon: Hessie Knows, MD;  Location: [facility] ORS;  Service: Orthopedics;  Laterality: Left;  #[id number]   COLONOSCOPY WITH PROPOFOL N/A 09/25/2021   Procedure: COLONOSCOPY WITH PROPOFOL;  Surgeon: Lin Landsman, MD;  Location: [facility] ENDOSCOPY;  Service: Gastroenterology;  Laterality: N/A;   DILATATION & CURETTAGE/HYSTEROSCOPY WITH MYOSURE  10/17/2020   Procedure: DILATATION & CURETTAGE/HYSTEROSCOPY WITH MYOSURE POLYPECTOMY;  Surgeon: Homero Fellers, MD;  Location: [facility] ORS;  Service: Gynecology;;   DILATION AND CURETTAGE OF UTERUS     x 3   TOE SURGERY Left    second toe   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   Procedure: LEFT TOTAL KNEE ARTHROPLASTY;  Surgeon: Hessie Knows, MD;  Location: [facility] ORS;  Service: Orthopedics;  Laterality: Left;  Medical History: Past Medical History:  Diagnosis Date   Anxiety    Arthritis    Bruises easily    COVID-19 04/2019   Diabetes mellitus without complication (HCC)    Headache    HLD (hyperlipidemia)    Hypertension    Leg pain    Pneumonia    about 5 yrs ago   Sleep apnea     Family History: Family History  Problem Relation Age of Onset   Hypertension Father    Stroke Father    Hypertension Brother    Stroke Brother    Hyperlipidemia Brother    Asthma Son      Social History   Socioeconomic History   Marital status: Married    Spouse name: Not on file   Number of children: Not on file   Years of education: Not on file   Highest education level: Not on file  Occupational History   Not on file  Tobacco Use   Smoking status: Former   Smokeless tobacco: Never  Vaping Use   Vaping Use: Never used  Substance and Sexual Activity   Alcohol use: Yes    Comment: ocassionally   Drug use: No   Sexual activity: Yes  Other Topics Concern   Not on file  Social History Narrative   Not on file   Social Determinants of Health   Financial Resource Strain: Not on file  Food Insecurity: Not on file  Transportation Needs: Not on file  Physical Activity: Not on file  Stress: Not on file  Social Connections: Not on file  Intimate Partner Violence: Not on file      Review of Systems  Constitutional:  Negative for chills, fatigue and unexpected weight change.  HENT:  Negative for congestion, rhinorrhea, sneezing and sore throat.   Eyes:  Negative for redness.  Respiratory:  Negative for cough, chest tightness and shortness of breath.   Cardiovascular:  Negative for chest pain and palpitations.  Gastrointestinal:  Negative for abdominal pain, constipation, diarrhea, nausea and vomiting.  Genitourinary:  Negative for dysuria and frequency.  Musculoskeletal:  Negative for arthralgias, back pain, joint swelling and neck pain.  Skin:  Negative for rash.  Neurological: Negative.  Negative for tremors and numbness.  Hematological:  Negative for adenopathy. Does not bruise/bleed easily.  Psychiatric/Behavioral:  Negative for behavioral problems (Depression), sleep disturbance and suicidal ideas. The patient is not nervous/anxious.    Vital Signs: BP 124/86    Pulse (!) 52    Temp 98.1 F (36.7 C)    Resp 16    Ht 5' 6"  (1.676 m)    Wt 263 lb 9.6 oz (119.6 kg)    SpO2 99%    BMI 42.55 kg/m    Physical Exam Vitals reviewed.  Constitutional:       General: She is not in acute distress.    Appearance: Normal appearance. She is obese. She is not ill-appearing.  HENT:     Head: Normocephalic and atraumatic.  Eyes:     Pupils: Pupils are equal, round, and reactive to light.  Cardiovascular:     Rate and Rhythm: Normal rate and regular rhythm.  Pulmonary:     Effort: Pulmonary effort is normal. No respiratory distress.  Neurological:     Mental Status: She is alert and oriented to person, place, and time.  Psychiatric:        Mood and Affect: Mood normal.        Behavior: Behavior normal.  Assessment/Plan: 1. Type 2 diabetes mellitus with hyperglycemia, without long-term current use of insulin (HCC) A1c is 6.2, stable and consistent.  Patient remains diet controlled.  Repeat A1c in 3 months. - POCT HgB A1C  2. Essential (primary) hypertension Blood pressure is well controlled with current medications, no changes at this time  3. Pain in right ankle and joints of right foot Due to chronic pain of right ankle, physical activity is difficult to do. patient is planning to try water aerobics  4. Chronic pain of right knee Chronic right knee pain makes physical activity difficult.  Patient will try water aerobics at the [facility].  5. BMI 40.0-44.9, adult [facility]) Patient is frustrated because she has not been able to lose any weight.  Patient will try water aerobics at the [facility].  When she returns in 3 months for her follow-up visit, will discuss the need to add medication to aid in weight loss if necessary.   General Counseling: asani deniston understanding of the findings of todays visit and agrees with plan of treatment. I have discussed any further diagnostic evaluation that may be needed or ordered today. We also reviewed her medications today. she has been encouraged to call the office with any questions or concerns that should arise related to todays visit.    Orders Placed This Encounter  Procedures   POCT HgB A1C     No orders of the defined types were placed in this encounter.   Return in about 3 months (around 02/19/2022) for F/U, Recheck A1C, Regine Christian PCP.   Total time spent:30 Minutes Time spent includes review of chart, medications, test results, and follow up plan with the patient.   [place] Controlled Substance Database was reviewed by me.  This patient was seen by Jonetta Osgood, FNP-C in collaboration with Dr. Clayborn Bigness as a part of collaborative care agreement.   Talibah Colasurdo R. Valetta Fuller, MSN, FNP-C Internal medicine

## 2021-12-18 ENCOUNTER — Other Ambulatory Visit: Payer: Self-pay | Admitting: Nurse Practitioner

## 2021-12-18 DIAGNOSIS — E876 Hypokalemia: Secondary | ICD-10-CM

## 2022-02-04 ENCOUNTER — Other Ambulatory Visit: Payer: Self-pay | Admitting: Nurse Practitioner

## 2022-02-04 DIAGNOSIS — I10 Essential (primary) hypertension: Secondary | ICD-10-CM

## 2022-02-18 ENCOUNTER — Ambulatory Visit (INDEPENDENT_AMBULATORY_CARE_PROVIDER_SITE_OTHER): Payer: Medicare HMO | Admitting: Nurse Practitioner

## 2022-02-18 ENCOUNTER — Encounter: Payer: Self-pay | Admitting: Nurse Practitioner

## 2022-02-18 VITALS — BP 140/64 | HR 70 | Temp 98.4°F | Resp 16 | Ht 66.0 in | Wt 262.4 lb

## 2022-02-18 DIAGNOSIS — E559 Vitamin D deficiency, unspecified: Secondary | ICD-10-CM

## 2022-02-18 DIAGNOSIS — E1165 Type 2 diabetes mellitus with hyperglycemia: Secondary | ICD-10-CM | POA: Diagnosis not present

## 2022-02-18 DIAGNOSIS — I10 Essential (primary) hypertension: Secondary | ICD-10-CM | POA: Diagnosis not present

## 2022-02-18 DIAGNOSIS — I700 Atherosclerosis of aorta: Secondary | ICD-10-CM | POA: Diagnosis not present

## 2022-02-18 LAB — POCT GLYCOSYLATED HEMOGLOBIN (HGB A1C): Hemoglobin A1C: 6.2 % — AB (ref 4.0–5.6)

## 2022-02-18 MED ORDER — CHLORTHALIDONE 25 MG PO TABS: 25.0000 mg | ORAL_TABLET | Freq: Every day | ORAL | 1 refills | Status: DC

## 2022-02-18 MED ORDER — AMLODIPINE BESYLATE 10 MG PO TABS: 10.0000 mg | ORAL_TABLET | Freq: Every day | ORAL | 1 refills | Status: DC

## 2022-02-18 MED ORDER — ROSUVASTATIN CALCIUM 5 MG PO TABS: 5.0000 mg | ORAL_TABLET | Freq: Every day | ORAL | 1 refills | Status: DC

## 2022-02-18 MED ORDER — BISOPROLOL FUMARATE 5 MG PO TABS: 5.0000 mg | ORAL_TABLET | Freq: Every day | ORAL | 1 refills | Status: DC

## 2022-02-18 MED ORDER — CALCITRIOL 0.25 MCG PO CAPS: 0.2500 ug | ORAL_CAPSULE | Freq: Every day | ORAL | 5 refills | Status: DC

## 2022-02-18 NOTE — Progress Notes (Signed)
[facility] [place], [place] [address]  Internal MEDICINE  Office Visit Note  Patient Name: Jill Banks  [id number]  [id number]  Date of Service: 02/18/2022  Chief Complaint  Patient presents with   Follow-up   Diabetes   Hyperlipidemia   Hypertension   Anxiety    HPI Millee presents for follow-up visit for diabetes, hypertension, hyperlipidemia and anxiety.  Her A1c is 6.2 today which is no change from her previous A1c and so she is stable at this time and we will continue to monitor. Her blood pressure is stable with current medications. She continues to take rosuvastatin for hyperlipidemia. She does need medication refills today    Current Medication: Outpatient Encounter Medications as of 02/18/2022  Medication Sig   Accu-Chek Softclix Lancets lancets Use as instructed to check blood sugars once daily   Blood Glucose Monitoring Suppl (ONETOUCH VERIO FLEX SYSTEM) w/Device KIT    diclofenac sodium (VOLTAREN) 1 % GEL Apply 2 g topically 4 (four) times daily as needed (knee pain).   glucose blood test strip Use as instructed to check blood sugars once daily E11.65   naproxen sodium (ALEVE) 220 MG tablet Take 440 mg by mouth 2 (two) times daily as needed (pain).   NON FORMULARY cpap device   potassium chloride SA (KLOR-CON M) 20 MEQ tablet TAKE 1 TABLET BY MOUTH TWICE DAILY   [DISCONTINUED] amLODipine (NORVASC) 10 MG tablet TAKE 1 TABLET BY MOUTH ONCE DAILY.   [DISCONTINUED] bisoprolol (ZEBETA) 5 MG tablet Take 1 tablet (5 mg total) by mouth daily.   [DISCONTINUED] calcitRIOL (ROCALTROL) 0.25 MCG capsule Take 1 capsule (0.25 mcg total) by mouth daily.   [DISCONTINUED] chlorthalidone (HYGROTON) 25 MG tablet Take 1 tablet (25 mg total) by mouth daily.   [DISCONTINUED] rosuvastatin (CRESTOR) 5 MG tablet Take 1 tablet (5 mg total) by mouth daily.   amLODipine (NORVASC) 10 MG tablet Take 1 tablet (10 mg total) by mouth daily.   bisoprolol (ZEBETA) 5 MG  tablet Take 1 tablet (5 mg total) by mouth daily.   calcitRIOL (ROCALTROL) 0.25 MCG capsule Take 1 capsule (0.25 mcg total) by mouth daily.   chlorthalidone (HYGROTON) 25 MG tablet Take 1 tablet (25 mg total) by mouth daily.   rosuvastatin (CRESTOR) 5 MG tablet Take 1 tablet (5 mg total) by mouth daily.   No facility-administered encounter medications on file as of 02/18/2022.    Surgical History: Past Surgical History:  Procedure Laterality Date   APPLICATION OF WOUND VAC 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   Procedure: APPLICATION OF WOUND VAC;  Surgeon: Hessie Knows, MD;  Location: [facility] ORS;  Service: Orthopedics;  Laterality: Left;  #[id number]   COLONOSCOPY WITH PROPOFOL N/A 09/25/2021   Procedure: COLONOSCOPY WITH PROPOFOL;  Surgeon: Lin Landsman, MD;  Location: [facility] ENDOSCOPY;  Service: Gastroenterology;  Laterality: N/A;   DILATATION & CURETTAGE/HYSTEROSCOPY WITH MYOSURE  10/17/2020   Procedure: DILATATION & CURETTAGE/HYSTEROSCOPY WITH MYOSURE POLYPECTOMY;  Surgeon: Homero Fellers, MD;  Location: [facility] ORS;  Service: Gynecology;;   DILATION AND CURETTAGE OF UTERUS     x 3   TOE SURGERY Left    second toe   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   Procedure: LEFT TOTAL KNEE ARTHROPLASTY;  Surgeon: Hessie Knows, MD;  Location: [facility] ORS;  Service: Orthopedics;  Laterality: Left;    Medical History: Past Medical History:  Diagnosis Date   Anxiety    Arthritis    Bruises easily    COVID-19 04/2019  Diabetes mellitus without complication (HCC)    Headache    HLD (hyperlipidemia)    Hypertension    Leg pain    Pneumonia    about 5 yrs ago   Sleep apnea     Family History: Family History  Problem Relation Age of Onset   Hypertension Father    Stroke Father    Hypertension Brother    Stroke Brother    Hyperlipidemia Brother    Asthma Son     Social History   Socioeconomic History   Marital status: Married    Spouse name: Not on file   Number of children: Not on file    Years of education: Not on file   Highest education level: Not on file  Occupational History   Not on file  Tobacco Use   Smoking status: Former   Smokeless tobacco: Never  Vaping Use   Vaping Use: Never used  Substance and Sexual Activity   Alcohol use: Yes    Comment: ocassionally   Drug use: No   Sexual activity: Yes  Other Topics Concern   Not on file  Social History Narrative   Not on file   Social Determinants of Health   Financial Resource Strain: Not on file  Food Insecurity: Not on file  Transportation Needs: Not on file  Physical Activity: Not on file  Stress: Not on file  Social Connections: Not on file  Intimate Partner Violence: Not on file      Review of Systems  Constitutional:  Negative for chills, fatigue and unexpected weight change.  HENT:  Negative for congestion, rhinorrhea, sneezing and sore throat.   Eyes:  Negative for redness.  Respiratory: Negative.  Negative for cough, chest tightness, shortness of breath and wheezing.   Cardiovascular: Negative.  Negative for chest pain and palpitations.  Gastrointestinal:  Negative for abdominal pain, constipation, diarrhea, nausea and vomiting.  Genitourinary:  Negative for dysuria and frequency.  Musculoskeletal:  Negative for arthralgias, back pain, joint swelling and neck pain.  Skin:  Negative for rash.  Neurological: Negative.  Negative for tremors and numbness.  Hematological:  Negative for adenopathy. Does not bruise/bleed easily.  Psychiatric/Behavioral:  Negative for behavioral problems (Depression), sleep disturbance and suicidal ideas. The patient is not nervous/anxious.     Vital Signs: BP 140/64   Pulse 70   Temp 98.4 F (36.9 C)   Resp 16   Ht 5' 6"  (1.676 m)   Wt 262 lb 6.4 oz (119 kg)   SpO2 98%   BMI 42.35 kg/m    Physical Exam Vitals reviewed.  Constitutional:      General: She is not in acute distress.    Appearance: Normal appearance. She is obese. She is not  ill-appearing.  HENT:     Head: Normocephalic and atraumatic.  Eyes:     Pupils: Pupils are equal, round, and reactive to light.  Cardiovascular:     Rate and Rhythm: Normal rate and regular rhythm.  Pulmonary:     Effort: Pulmonary effort is normal. No respiratory distress.  Neurological:     Mental Status: She is alert and oriented to person, place, and time.  Psychiatric:        Mood and Affect: Mood normal.        Behavior: Behavior normal.        Assessment/Plan: 1. Type 2 diabetes mellitus with hyperglycemia, without long-term current use of insulin (HCC) A1c is stable at 6.2, no change from previous A1c  2 in March, no changes in current medication regimen, follow-up in 3 months for repeat A1c - POCT HgB A1C  2. Essential (primary) hypertension Blood pressure is stable on current medications, medication refills ordered. - bisoprolol (ZEBETA) 5 MG tablet; Take 1 tablet (5 mg total) by mouth daily.  Dispense: 90 tablet; Refill: 1 - chlorthalidone (HYGROTON) 25 MG tablet; Take 1 tablet (25 mg total) by mouth daily.  Dispense: 90 tablet; Refill: 1 - amLODipine (NORVASC) 10 MG tablet; Take 1 tablet (10 mg total) by mouth daily.  Dispense: 90 tablet; Refill: 1  3. Aortic atherosclerosis (HCC) Continue rosuvastatin as prescribed, refills ordered. - rosuvastatin (CRESTOR) 5 MG tablet; Take 1 tablet (5 mg total) by mouth daily.  Dispense: 90 tablet; Refill: 1  4. Vitamin D deficiency, unspecified Continue calcitriol as prescribed, refills ordered. - calcitRIOL (ROCALTROL) 0.25 MCG capsule; Take 1 capsule (0.25 mcg total) by mouth daily.  Dispense: 90 capsule; Refill: 5   General Counseling: Rafaelita verbalizes understanding of the findings of todays visit and agrees with plan of treatment. I have discussed any further diagnostic evaluation that may be needed or ordered today. We also reviewed her medications today. she has been encouraged to call the office with any questions or  concerns that should arise related to todays visit.    Orders Placed This Encounter  Procedures   POCT HgB A1C    Meds ordered this encounter  Medications   rosuvastatin (CRESTOR) 5 MG tablet    Sig: Take 1 tablet (5 mg total) by mouth daily.    Dispense:  90 tablet    Refill:  1   bisoprolol (ZEBETA) 5 MG tablet    Sig: Take 1 tablet (5 mg total) by mouth daily.    Dispense:  90 tablet    Refill:  1   chlorthalidone (HYGROTON) 25 MG tablet    Sig: Take 1 tablet (25 mg total) by mouth daily.    Dispense:  90 tablet    Refill:  1   calcitRIOL (ROCALTROL) 0.25 MCG capsule    Sig: Take 1 capsule (0.25 mcg total) by mouth daily.    Dispense:  90 capsule    Refill:  5   amLODipine (NORVASC) 10 MG tablet    Sig: Take 1 tablet (10 mg total) by mouth daily.    Dispense:  90 tablet    Refill:  1    Return in about 3 months (around 05/21/2022) for F/U, Recheck A1C, Madell Heino PCP.   Total time spent:30 Minutes Time spent includes review of chart, medications, test results, and follow up plan with the patient.   [place] Controlled Substance Database was reviewed by me.  This patient was seen by Jonetta Osgood, FNP-C in collaboration with Dr. Clayborn Bigness as a part of collaborative care agreement.   Teaira Croft R. Valetta Fuller, MSN, FNP-C Internal medicine

## 2022-04-14 ENCOUNTER — Encounter: Payer: Self-pay | Admitting: Nurse Practitioner

## 2022-08-07 ENCOUNTER — Ambulatory Visit: Payer: Medicare HMO | Admitting: Nurse Practitioner

## 2022-08-07 ENCOUNTER — Telehealth: Payer: Self-pay | Admitting: Nurse Practitioner

## 2022-08-07 NOTE — Telephone Encounter (Signed)
Patient came into office today for her annual wellness visit. I explained to her since it was too early in the year, the appointment had been changed 02/15/22 to December and this was highlighted on her AVS when she was seen in office 02/18/22. She stated she probably did not pay attention to that. I asked her if 08/20/22 was okay for her appointment, she stated she was not sure, she will call back to office-Toni

## 2022-08-20 ENCOUNTER — Encounter: Payer: Self-pay | Admitting: Nurse Practitioner

## 2022-08-20 ENCOUNTER — Ambulatory Visit (INDEPENDENT_AMBULATORY_CARE_PROVIDER_SITE_OTHER): Payer: Medicare HMO | Admitting: Nurse Practitioner

## 2022-08-20 VITALS — BP 140/78 | HR 67 | Temp 97.6°F | Resp 16 | Ht 66.0 in | Wt 266.8 lb

## 2022-08-20 DIAGNOSIS — Z0001 Encounter for general adult medical examination with abnormal findings: Secondary | ICD-10-CM

## 2022-08-20 DIAGNOSIS — Z23 Encounter for immunization: Secondary | ICD-10-CM

## 2022-08-20 DIAGNOSIS — I10 Essential (primary) hypertension: Secondary | ICD-10-CM | POA: Diagnosis not present

## 2022-08-20 DIAGNOSIS — E1165 Type 2 diabetes mellitus with hyperglycemia: Secondary | ICD-10-CM | POA: Diagnosis not present

## 2022-08-20 DIAGNOSIS — I700 Atherosclerosis of aorta: Secondary | ICD-10-CM

## 2022-08-20 DIAGNOSIS — R300 Dysuria: Secondary | ICD-10-CM

## 2022-08-20 LAB — POCT GLYCOSYLATED HEMOGLOBIN (HGB A1C): Hemoglobin A1C: 6.6 % — AB (ref 4.0–5.6)

## 2022-08-20 MED ORDER — AMLODIPINE BESYLATE 5 MG PO TABS: 5.0000 mg | ORAL_TABLET | Freq: Every day | ORAL | 2 refills | Status: DC

## 2022-08-20 MED ORDER — AMLODIPINE BESYLATE 10 MG PO TABS: 10.0000 mg | ORAL_TABLET | Freq: Every day | ORAL | 1 refills | Status: DC

## 2022-08-20 MED ORDER — CHLORTHALIDONE 25 MG PO TABS: 25.0000 mg | ORAL_TABLET | Freq: Every day | ORAL | 1 refills | Status: DC

## 2022-08-20 MED ORDER — BISOPROLOL FUMARATE 5 MG PO TABS: 5.0000 mg | ORAL_TABLET | Freq: Every day | ORAL | 1 refills | Status: DC

## 2022-08-20 MED ORDER — TETANUS-DIPHTH-ACELL PERTUSSIS 5-2.5-18.5 LF-MCG/0.5 IM SUSP: 0.5000 mL | Freq: Once | INTRAMUSCULAR | 0 refills | Status: AC

## 2022-08-20 MED ORDER — BISOPROLOL FUMARATE 10 MG PO TABS: 10.0000 mg | ORAL_TABLET | Freq: Every day | ORAL | 2 refills | Status: DC

## 2022-08-20 NOTE — Progress Notes (Signed)
[facility] [place], [place] [address]  Internal MEDICINE  Office Visit Note  Patient Name: Jill Banks  [id number]  [id number]  Date of Service: 08/20/2022  Chief Complaint  Patient presents with   Medicare Wellness   Hyperlipidemia   Hypertension   Diabetes    HPI Jill Banks presents for an annual well visit and physical exam.  Well-appearing 66 y.o. female with hypertension, aortic atherosclerosis, OSA, diabetes,  Routine CRC screening: done in January this year  Routine mammogram: done at [facility] 08/13/22 A1c 6.6, increased by 0.4 DEXA scan: done in 2015 Labs: due for routine labs New or worsening pain: none Other concerns: none     08/20/2022   10:56 AM 08/16/2021   10:18 AM  MMSE - Mini Mental State Exam  Orientation to time 5 5  Orientation to Place 5 5  Registration 3 3  Attention/ Calculation 5 5  Recall 3 3  Language- name 2 objects 2 2  Language- repeat 1 1  Language- follow 3 step command 3 3  Language- read & follow direction 1 1  Write a sentence 1 1  Copy design 1 1  Total score 30 30    Functional Status Survey: Is the patient deaf or have difficulty hearing?: No Does the patient have difficulty seeing, even when wearing glasses/contacts?: No Does the patient have difficulty concentrating, remembering, or making decisions?: No Does the patient have difficulty walking or climbing stairs?: Yes Does the patient have difficulty dressing or bathing?: No Does the patient have difficulty doing errands alone such as visiting a doctor's office or shopping?: No     08/16/2021   10:16 AM 09/25/2021   10:16 AM 11/19/2021   10:10 AM 02/18/2022   12:02 PM 08/20/2022   10:55 AM  [place] in the past year? 0  0 0 0  Was there an injury with Fall?     0  Fall Risk Category Calculator     0  Fall Risk Category     Low  Patient Fall Risk Level _0   Patient at Risk for  Falls Due to No Fall Risks  No Fall Risks No Fall Risks No Fall Risks  Fall risk Follow up Falls evaluation completed  Falls evaluation completed Falls evaluation completed Falls evaluation completed       08/20/2022   10:55 AM  Depression screen PHQ 2/9  Decreased Interest 0  Down, Depressed, Hopeless 0  PHQ - 2 Score 0        Current Medication: Outpatient Encounter Medications as of 08/20/2022  Medication Sig   Accu-Chek Softclix Lancets lancets Use as instructed to check blood sugars once daily   amLODipine (NORVASC) 5 MG tablet Take 1 tablet (5 mg total) by mouth daily.   bisoprolol (ZEBETA) 10 MG tablet Take 1 tablet (10 mg total) by mouth daily.   Blood Glucose Monitoring Suppl (ONETOUCH VERIO FLEX SYSTEM) w/Device KIT    calcitRIOL (ROCALTROL) 0.25 MCG capsule Take 1 capsule (0.25 mcg total) by mouth daily.   diclofenac sodium (VOLTAREN) 1 % GEL Apply 2 g topically 4 (four) times daily as needed (knee pain).   glucose blood test strip Use as instructed to check blood sugars once daily E11.65   naproxen sodium (ALEVE) 220 MG tablet Take 440 mg by mouth 2 (two) times daily as needed (pain).   NON FORMULARY cpap device  potassium chloride SA (KLOR-CON M) 20 MEQ tablet TAKE 1 TABLET BY MOUTH TWICE DAILY   Tdap (BOOSTRIX) 5-2.5-18.5 LF-MCG/0.5 injection Inject 0.5 mLs into the muscle once for 1 dose.   [DISCONTINUED] amLODipine (NORVASC) 10 MG tablet Take 1 tablet (10 mg total) by mouth daily.   [DISCONTINUED] bisoprolol (ZEBETA) 5 MG tablet Take 1 tablet (5 mg total) by mouth daily.   [DISCONTINUED] chlorthalidone (HYGROTON) 25 MG tablet Take 1 tablet (25 mg total) by mouth daily.   chlorthalidone (HYGROTON) 25 MG tablet Take 1 tablet (25 mg total) by mouth daily.   [DISCONTINUED] amLODipine (NORVASC) 10 MG tablet Take 1 tablet (10 mg total) by mouth daily.   [DISCONTINUED] bisoprolol (ZEBETA) 5 MG tablet Take 1 tablet (5 mg total) by mouth daily.   [DISCONTINUED] rosuvastatin  (CRESTOR) 5 MG tablet Take 1 tablet (5 mg total) by mouth daily.   No facility-administered encounter medications on file as of 08/20/2022.    Surgical History: Past Surgical History:  Procedure Laterality Date   APPLICATION OF WOUND VAC 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   Procedure: APPLICATION OF WOUND VAC;  Surgeon: Hessie Knows, MD;  Location: [facility] ORS;  Service: Orthopedics;  Laterality: Left;  #[id number]   COLONOSCOPY WITH PROPOFOL N/A 09/25/2021   Procedure: COLONOSCOPY WITH PROPOFOL;  Surgeon: Lin Landsman, MD;  Location: [facility] ENDOSCOPY;  Service: Gastroenterology;  Laterality: N/A;   DILATATION & CURETTAGE/HYSTEROSCOPY WITH MYOSURE  10/17/2020   Procedure: DILATATION & CURETTAGE/HYSTEROSCOPY WITH MYOSURE POLYPECTOMY;  Surgeon: Homero Fellers, MD;  Location: [facility] ORS;  Service: Gynecology;;   DILATION AND CURETTAGE OF UTERUS     x 3   TOE SURGERY Left    second toe   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   Procedure: LEFT TOTAL KNEE ARTHROPLASTY;  Surgeon: Hessie Knows, MD;  Location: [facility] ORS;  Service: Orthopedics;  Laterality: Left;    Medical History: Past Medical History:  Diagnosis Date   Anxiety    Arthritis    Bruises easily    COVID-19 04/2019   Diabetes mellitus without complication (HCC)    Headache    HLD (hyperlipidemia)    Hypertension    Leg pain    Pneumonia    about 5 yrs ago   Sleep apnea     Family History: Family History  Problem Relation Age of Onset   Hypertension Father    Stroke Father    Hypertension Brother    Stroke Brother    Hyperlipidemia Brother    Asthma Son     Social History   Socioeconomic History   Marital status: Married    Spouse name: Not on file   Number of children: Not on file   Years of education: Not on file   Highest education level: Not on file  Occupational History   Not on file  Tobacco Use   Smoking status: Former   Smokeless tobacco: Never  Vaping Use   Vaping Use: Never used  Substance and Sexual  Activity   Alcohol use: Yes    Comment: ocassionally   Drug use: No   Sexual activity: Yes  Other Topics Concern   Not on file  Social History Narrative   Not on file   Social Determinants of Health   Financial Resource Strain: Not on file  Food Insecurity: Not on file  Transportation Needs: Not on file  Physical Activity: Not on file  Stress: Not on file  Social Connections: Not on file  Intimate Partner Violence: Not on file  Review of Systems  Constitutional:  Negative for activity change, appetite change, chills, fatigue, fever and unexpected weight change.  HENT: Negative.  Negative for congestion, ear pain, rhinorrhea, sore throat and trouble swallowing.   Eyes: Negative.   Respiratory: Negative.  Negative for cough, chest tightness, shortness of breath and wheezing.   Cardiovascular: Negative.  Negative for chest pain.  Gastrointestinal: Negative.  Negative for abdominal pain, blood in stool, constipation, diarrhea, nausea and vomiting.  Endocrine: Negative.   Genitourinary: Negative.  Negative for difficulty urinating, dysuria, frequency, hematuria and urgency.  Musculoskeletal: Negative.  Negative for arthralgias, back pain, joint swelling, myalgias and neck pain.  Skin: Negative.  Negative for rash and wound.  Allergic/Immunologic: Negative.  Negative for immunocompromised state.  Neurological: Negative.  Negative for dizziness, seizures, numbness and headaches.  Hematological: Negative.   Psychiatric/Behavioral: Negative.  Negative for behavioral problems, self-injury and suicidal ideas. The patient is not nervous/anxious.     Vital Signs: BP (!) 140/78 Comment: 164/81  Pulse 67   Temp 97.6 F (36.4 C)   Resp 16   Ht _0  (1.676 m)   Wt 266 lb 12.8 oz (121 kg)   BMI 43.06 kg/m    Physical Exam Vitals reviewed.  Constitutional:      General: She is awake. She is not in acute distress.    Appearance: Normal appearance. She is well-developed and  well-groomed. She is obese. She is not ill-appearing or diaphoretic.  HENT:     Head: Normocephalic and atraumatic.     Right Ear: Tympanic membrane, ear canal and external ear normal.     Left Ear: Tympanic membrane, ear canal and external ear normal.     Nose: Nose normal. No congestion or rhinorrhea.     Mouth/Throat:     Lips: Pink.     Mouth: Mucous membranes are moist.     Pharynx: Oropharynx is clear. Uvula midline. No oropharyngeal exudate.  Eyes:     General: Lids are normal. Vision grossly intact. Gaze aligned appropriately. No scleral icterus.       Right eye: No discharge.        Left eye: No discharge.     Extraocular Movements: Extraocular movements intact.     Conjunctiva/sclera: Conjunctivae normal.     Pupils: Pupils are equal, round, and reactive to light.     Funduscopic exam:    Right eye: Red reflex present.        Left eye: Red reflex present. Neck:     Thyroid: No thyromegaly.     Vascular: No carotid bruit or JVD.     Trachea: Trachea and phonation normal. No tracheal deviation.  Cardiovascular:     Rate and Rhythm: Normal rate and regular rhythm.     Pulses: Normal pulses.     Heart sounds: Normal heart sounds, S1 normal and S2 normal. No murmur heard.    No friction rub. No gallop.  Pulmonary:     Effort: Pulmonary effort is normal. No accessory muscle usage or respiratory distress.     Breath sounds: Normal breath sounds and air entry. No stridor. No wheezing or rales.  Chest:     Chest wall: No tenderness.     Comments: Declined clinical breast exam, gets yearly mamograms Abdominal:     General: Bowel sounds are normal. There is no distension.     Palpations: Abdomen is soft. There is no shifting dullness, fluid wave, mass or pulsatile mass.     Tenderness: There  is no abdominal tenderness. There is no guarding or rebound.  Musculoskeletal:        General: No tenderness or deformity. Normal range of motion.     Cervical back: Normal range of motion  and neck supple.  Lymphadenopathy:     Cervical: No cervical adenopathy.  Skin:    General: Skin is warm and dry.     Capillary Refill: Capillary refill takes less than 2 seconds.     Coloration: Skin is not pale.     Findings: No erythema or rash.  Neurological:     Mental Status: She is alert and oriented to person, place, and time.     Cranial Nerves: No cranial nerve deficit.     Motor: No abnormal muscle tone.     Coordination: Coordination normal.     Gait: Gait normal.     Deep Tendon Reflexes: Reflexes are normal and symmetric.  Psychiatric:        Mood and Affect: Mood normal.        Behavior: Behavior normal. Behavior is cooperative.        Thought Content: Thought content normal.        Judgment: Judgment normal.        Assessment/Plan: 1. Encounter for routine adult health examination with abnormal findings Age-appropriate preventive screenings and vaccinations discussed, annual physical exam completed. Routine labs for health maintenance ordered, see below. PHM updated.    2. Essential (primary) hypertension Amlodipine dose decreased due to lower extremity edema, bisoprolol dose increased to compensate for decreasing amlodipine. Start new doses tomorrow please - chlorthalidone (HYGROTON) 25 MG tablet; Take 1 tablet (25 mg total) by mouth daily.  Dispense: 90 tablet; Refill: 1 - amLODipine (NORVASC) 5 MG tablet; Take 1 tablet (5 mg total) by mouth daily.  Dispense: 30 tablet; Refill: 2 - bisoprolol (ZEBETA) 10 MG tablet; Take 1 tablet (10 mg total) by mouth daily.  Dispense: 30 tablet; Refill: 2  3. Type 2 diabetes mellitus with hyperglycemia, without long-term current use of insulin (HCC) A1c elevated at 6.6; encouraged patient to work on diet and lifestyle modifications. Routine labs ordered, follow up for repeat A1c in 3 months - POCT glycosylated hemoglobin (Hb A1C) - Urine Microalbumin w/creat. ratio - CBC with Differential/Platelet - CMP14+EGFR  4. Aortic  atherosclerosis ([place]) Routine lab ordered - Lipid Profile  5. Dysuria Routine urinalysis done - UA/M w/rflx Culture, Routine - Microscopic Examination - Urine Culture, Reflex  6. Need for vaccination - Tdap ([place]) 5-2.5-18.5 LF-MCG/0.5 injection; Inject 0.5 mLs into the muscle once for 1 dose.  Dispense: 0.5 mL; Refill: 0      General Counseling: Timya verbalizes understanding of the findings of todays visit and agrees with plan of treatment. I have discussed any further diagnostic evaluation that may be needed or ordered today. We also reviewed her medications today. she has been encouraged to call the office with any questions or concerns that should arise related to todays visit.    Orders Placed This Encounter  Procedures   Urine Microalbumin w/creat. ratio   UA/M w/rflx Culture, Routine   CBC with Differential/Platelet   CMP14+EGFR   Lipid Profile   POCT glycosylated hemoglobin (Hb A1C)    Meds ordered this encounter  Medications   chlorthalidone (HYGROTON) 25 MG tablet    Sig: Take 1 tablet (25 mg total) by mouth daily.    Dispense:  90 tablet    Refill:  1   DISCONTD: bisoprolol (ZEBETA) 5 MG tablet  Sig: Take 1 tablet (5 mg total) by mouth daily.    Dispense:  90 tablet    Refill:  1   DISCONTD: amLODipine (NORVASC) 10 MG tablet    Sig: Take 1 tablet (10 mg total) by mouth daily.    Dispense:  90 tablet    Refill:  1   Tdap (BOOSTRIX) 5-2.5-18.5 LF-MCG/0.5 injection    Sig: Inject 0.5 mLs into the muscle once for 1 dose.    Dispense:  0.5 mL    Refill:  0   amLODipine (NORVASC) 5 MG tablet    Sig: Take 1 tablet (5 mg total) by mouth daily.    Dispense:  30 tablet    Refill:  2    Note decreased dose, please discontinue all prior prescriptions for amlodipine and fill today   bisoprolol (ZEBETA) 10 MG tablet    Sig: Take 1 tablet (10 mg total) by mouth daily.    Dispense:  30 tablet    Refill:  2    Note increased dose, discontinue all prior  orders for bisoprolol and fill this today    Return in about 1 month (around 09/20/2022) for F/U, BP check, Blakely Maranan PCP.   Total time spent:30 Minutes Time spent includes review of chart, medications, test results, and follow up plan with the patient.   [place] Controlled Substance Database was reviewed by me.  This patient was seen by Jonetta Osgood, FNP-C in collaboration with Dr. Clayborn Bigness as a part of collaborative care agreement.  Aram Domzalski R. Valetta Fuller, MSN, FNP-C Internal medicine

## 2022-08-21 LAB — CMP14+EGFR
ALT: 15 IU/L (ref 0–32)
AST: 18 IU/L (ref 0–40)
Albumin/Globulin Ratio: 1.9 (ref 1.2–2.2)
Albumin: 4.3 g/dL (ref 3.9–4.9)
Alkaline Phosphatase: 67 IU/L (ref 44–121)
BUN/Creatinine Ratio: 17 (ref 12–28)
BUN: 12 mg/dL (ref 8–27)
Bilirubin Total: 0.5 mg/dL (ref 0.0–1.2)
CO2: 25 mmol/L (ref 20–29)
Calcium: 9.6 mg/dL (ref 8.7–10.3)
Chloride: 99 mmol/L (ref 96–106)
Creatinine, Ser: 0.71 mg/dL (ref 0.57–1.00)
Globulin, Total: 2.3 g/dL (ref 1.5–4.5)
Glucose: 102 mg/dL — ABNORMAL HIGH (ref 70–99)
Potassium: 3.6 mmol/L (ref 3.5–5.2)
Sodium: 136 mmol/L (ref 134–144)
Total Protein: 6.6 g/dL (ref 6.0–8.5)
eGFR: 94 mL/min/{1.73_m2} (ref >59)

## 2022-08-21 LAB — LIPID PANEL
Chol/HDL Ratio: 3.6 ratio (ref 0.0–4.4)
Cholesterol, Total: 192 mg/dL (ref 100–199)
HDL: 54 mg/dL (ref >39)
LDL Chol Calc (NIH): 117 mg/dL — ABNORMAL HIGH (ref 0–99)
Triglycerides: 119 mg/dL (ref 0–149)
VLDL Cholesterol Cal: 21 mg/dL (ref 5–40)

## 2022-08-21 LAB — CBC WITH DIFFERENTIAL/PLATELET
Basophils Absolute: 0 10*3/uL (ref 0.0–0.2)
Basos: 1 %
EOS (ABSOLUTE): 0.3 10*3/uL (ref 0.0–0.4)
Eos: 3 %
Hematocrit: 41.9 % (ref 34.0–46.6)
Hemoglobin: 13.9 g/dL (ref 11.1–15.9)
Immature Grans (Abs): 0 10*3/uL (ref 0.0–0.1)
Immature Granulocytes: 0 %
Lymphocytes Absolute: 2.6 10*3/uL (ref 0.7–3.1)
Lymphs: 31 %
MCH: 27.5 pg (ref 26.6–33.0)
MCHC: 33.2 g/dL (ref 31.5–35.7)
MCV: 83 fL (ref 79–97)
Monocytes Absolute: 0.7 10*3/uL (ref 0.1–0.9)
Monocytes: 9 %
Neutrophils Absolute: 4.5 10*3/uL (ref 1.4–7.0)
Neutrophils: 56 %
Platelets: 134 10*3/uL — ABNORMAL LOW (ref 150–450)
RBC: 5.05 x10E6/uL (ref 3.77–5.28)
RDW: 14.5 % (ref 11.7–15.4)
WBC: 8.1 10*3/uL (ref 3.4–10.8)

## 2022-08-23 NOTE — Progress Notes (Signed)
Labs are grossly normal, no intervention needed at this time. Will discuss further at next office visit.

## 2022-08-25 LAB — MICROALBUMIN / CREATININE URINE RATIO
Creatinine, Urine: 118 mg/dL
Microalb/Creat Ratio: 8 mg/g creat (ref 0–29)
Microalbumin, Urine: 9.5 ug/mL

## 2022-08-25 LAB — MICROSCOPIC EXAMINATION
Bacteria, UA: NONE SEEN
Casts: NONE SEEN /lpf

## 2022-08-25 LAB — UA/M W/RFLX CULTURE, ROUTINE
Bilirubin, UA: NEGATIVE
Glucose, UA: NEGATIVE
Ketones, UA: NEGATIVE
Nitrite, UA: NEGATIVE
Protein,UA: NEGATIVE
RBC, UA: NEGATIVE
Specific Gravity, UA: 1.019 (ref 1.005–1.030)
Urobilinogen, Ur: 1 mg/dL (ref 0.2–1.0)
pH, UA: 7 (ref 5.0–7.5)

## 2022-08-25 LAB — URINE CULTURE, REFLEX

## 2022-08-31 ENCOUNTER — Encounter: Payer: Self-pay | Admitting: Nurse Practitioner

## 2022-09-18 ENCOUNTER — Ambulatory Visit (INDEPENDENT_AMBULATORY_CARE_PROVIDER_SITE_OTHER): Payer: Medicare HMO | Admitting: Nurse Practitioner

## 2022-09-18 ENCOUNTER — Encounter: Payer: Self-pay | Admitting: Nurse Practitioner

## 2022-09-18 VITALS — BP 138/82 | HR 60 | Temp 97.4°F | Resp 16 | Ht 66.0 in | Wt 266.0 lb

## 2022-09-18 DIAGNOSIS — E1165 Type 2 diabetes mellitus with hyperglycemia: Secondary | ICD-10-CM

## 2022-09-18 DIAGNOSIS — D696 Thrombocytopenia, unspecified: Secondary | ICD-10-CM

## 2022-09-18 DIAGNOSIS — I10 Essential (primary) hypertension: Secondary | ICD-10-CM | POA: Diagnosis not present

## 2022-09-18 NOTE — Progress Notes (Signed)
[facility] [place], [place] [address]  Internal MEDICINE  Office Visit Note  Patient Name: Jill Banks  [id number]  [id number]  Date of Service: 09/18/2022  Chief Complaint  Patient presents with   Follow-up   Hyperlipidemia   Hypertension   Diabetes    HPI Jill Banks presents for a follow-up visit for hypertension, labs, and diabetes Hypertension -- BP improved with current medications -- bisoprolol 10 mg and amlodipine 5 mg daily.  Thrombocytopenia -- chronically low platelets. Was seen by heme/onc in 2010, records are not available and patient does not remember what the outcome was. Platelets are still persistently low per review of past labs, wants to see hematology again.  Diabetes -- A1c checked in December, up at 6.6 then. Working on diet and lifestyle modifications    Current Medication: Outpatient Encounter Medications as of 09/18/2022  Medication Sig   Accu-Chek Softclix Lancets lancets Use as instructed to check blood sugars once daily   amLODipine (NORVASC) 5 MG tablet Take 1 tablet (5 mg total) by mouth daily.   bisoprolol (ZEBETA) 10 MG tablet Take 1 tablet (10 mg total) by mouth daily.   Blood Glucose Monitoring Suppl (ONETOUCH VERIO FLEX SYSTEM) w/Device KIT    calcitRIOL (ROCALTROL) 0.25 MCG capsule Take 1 capsule (0.25 mcg total) by mouth daily.   chlorthalidone (HYGROTON) 25 MG tablet Take 1 tablet (25 mg total) by mouth daily.   diclofenac sodium (VOLTAREN) 1 % GEL Apply 2 g topically 4 (four) times daily as needed (knee pain).   glucose blood test strip Use as instructed to check blood sugars once daily E11.65   naproxen sodium (ALEVE) 220 MG tablet Take 440 mg by mouth 2 (two) times daily as needed (pain).   NON FORMULARY cpap device   potassium chloride SA (KLOR-CON M) 20 MEQ tablet TAKE 1 TABLET BY MOUTH TWICE DAILY   No facility-administered encounter medications on file as of 09/18/2022.    Surgical History: Past Surgical  History:  Procedure Laterality Date   APPLICATION OF WOUND VAC 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   Procedure: APPLICATION OF WOUND VAC;  Surgeon: Hessie Knows, MD;  Location: [facility] ORS;  Service: Orthopedics;  Laterality: Left;  #[id number]   COLONOSCOPY WITH PROPOFOL N/A 09/25/2021   Procedure: COLONOSCOPY WITH PROPOFOL;  Surgeon: Lin Landsman, MD;  Location: [facility] ENDOSCOPY;  Service: Gastroenterology;  Laterality: N/A;   DILATATION & CURETTAGE/HYSTEROSCOPY WITH MYOSURE  10/17/2020   Procedure: DILATATION & CURETTAGE/HYSTEROSCOPY WITH MYOSURE POLYPECTOMY;  Surgeon: Homero Fellers, MD;  Location: [facility] ORS;  Service: Gynecology;;   DILATION AND CURETTAGE OF UTERUS     x 3   TOE SURGERY Left    second toe   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   Procedure: LEFT TOTAL KNEE ARTHROPLASTY;  Surgeon: Hessie Knows, MD;  Location: [facility] ORS;  Service: Orthopedics;  Laterality: Left;    Medical History: Past Medical History:  Diagnosis Date   Anxiety    Arthritis    Bruises easily    COVID-19 04/2019   Diabetes mellitus without complication (HCC)    Headache    HLD (hyperlipidemia)    Hypertension    Leg pain    Pneumonia    about 5 yrs ago   Sleep apnea     Family History: Family History  Problem Relation Age of Onset   Hypertension Father    Stroke Father    Hypertension Brother    Stroke Brother    Hyperlipidemia Brother  Asthma Son     Social History   Socioeconomic History   Marital status: Married    Spouse name: Not on file   Number of children: Not on file   Years of education: Not on file   Highest education level: Not on file  Occupational History   Not on file  Tobacco Use   Smoking status: Former   Smokeless tobacco: Never  Vaping Use   Vaping Use: Never used  Substance and Sexual Activity   Alcohol use: Yes    Comment: ocassionally   Drug use: No   Sexual activity: Yes  Other Topics Concern   Not on file  Social History Narrative   Not on file    Social Determinants of Health   Financial Resource Strain: Not on file  Food Insecurity: Not on file  Transportation Needs: Not on file  Physical Activity: Not on file  Stress: Not on file  Social Connections: Not on file  Intimate Partner Violence: Not on file      Review of Systems  Constitutional:  Negative for chills, fatigue and unexpected weight change.  HENT:  Negative for congestion, rhinorrhea, sneezing and sore throat.   Eyes:  Negative for redness.  Respiratory: Negative.  Negative for cough, chest tightness, shortness of breath and wheezing.   Cardiovascular: Negative.  Negative for chest pain and palpitations.  Gastrointestinal:  Negative for abdominal pain, constipation, diarrhea, nausea and vomiting.  Genitourinary:  Negative for dysuria and frequency.  Musculoskeletal:  Negative for arthralgias, back pain, joint swelling and neck pain.  Skin:  Negative for rash.  Neurological: Negative.  Negative for tremors and numbness.  Hematological:  Negative for adenopathy. Does not bruise/bleed easily.  Psychiatric/Behavioral:  Negative for behavioral problems (Depression), sleep disturbance and suicidal ideas. The patient is not nervous/anxious.     Vital Signs: BP 138/82 Comment: 174/84  Pulse 60   Temp (!) 97.4 F (36.3 C)   Resp 16   Ht _0  (1.676 m)   Wt 266 lb (120.7 kg)   BMI 42.93 kg/m    Physical Exam Vitals reviewed.  Constitutional:      General: She is not in acute distress.    Appearance: Normal appearance. She is obese. She is not ill-appearing.  HENT:     Head: Normocephalic and atraumatic.  Eyes:     Pupils: Pupils are equal, round, and reactive to light.  Cardiovascular:     Rate and Rhythm: Normal rate and regular rhythm.  Pulmonary:     Effort: Pulmonary effort is normal. No respiratory distress.  Neurological:     Mental Status: She is alert and oriented to person, place, and time.  Psychiatric:        Mood and Affect: Mood  normal.        Behavior: Behavior normal.        Assessment/Plan: 1. Essential (primary) hypertension BP improved, continue bisoprolol and amlodipine as prescribed.  2. Thrombocytopenia ([place]) Referred to heme/onc for further evaluation of chronic low platelet level - Ambulatory referral to Hematology / Oncology  3. Type 2 diabetes mellitus with hyperglycemia, without long-term current use of insulin (HCC) Follow up in 2 months in mid-march to repeat A1c and will discuss wegovy or diabetic equivalent then.    General Counseling: tayona sarnowski understanding of the findings of todays visit and agrees with plan of treatment. I have discussed any further diagnostic evaluation that may be needed or ordered today. We also reviewed her medications today.  she has been encouraged to call the office with any questions or concerns that should arise related to todays visit.    Orders Placed This Encounter  Procedures   Ambulatory referral to Hematology / Oncology    No orders of the defined types were placed in this encounter.   Return in about 2 months (around 11/26/2022) for F/U, Recheck A1C, Kamaree Wheatley PCP.   Total time spent:30 Minutes Time spent includes review of chart, medications, test results, and follow up plan with the patient.   [place] Controlled Substance Database was reviewed by me.  This patient was seen by Jonetta Osgood, FNP-C in collaboration with Dr. Clayborn Bigness as a part of collaborative care agreement.   Tomi Grandpre R. Valetta Fuller, MSN, FNP-C Internal medicine

## 2022-10-03 ENCOUNTER — Other Ambulatory Visit: Payer: Self-pay | Admitting: Nurse Practitioner

## 2022-10-03 DIAGNOSIS — I10 Essential (primary) hypertension: Secondary | ICD-10-CM

## 2022-10-14 ENCOUNTER — Telehealth: Payer: Self-pay

## 2022-10-14 ENCOUNTER — Encounter: Payer: Self-pay | Admitting: Oncology

## 2022-10-14 NOTE — Telephone Encounter (Signed)
Call made to new patient. Chart reviewed and updated. Medication reconciliation completed. Referral reason, location directions and appt details given to pt. Visitor, child and mask policy also addressed with pt. All questions answered.

## 2022-10-15 ENCOUNTER — Inpatient Hospital Stay: Payer: Medicare HMO

## 2022-10-15 ENCOUNTER — Inpatient Hospital Stay: Payer: Medicare HMO | Attending: Oncology | Admitting: Oncology

## 2022-10-15 VITALS — BP 167/70 | HR 63 | Temp 96.6°F | Resp 18 | Wt 265.6 lb

## 2022-10-15 DIAGNOSIS — D696 Thrombocytopenia, unspecified: Secondary | ICD-10-CM

## 2022-10-15 DIAGNOSIS — Z87891 Personal history of nicotine dependence: Secondary | ICD-10-CM | POA: Insufficient documentation

## 2022-10-15 DIAGNOSIS — Z803 Family history of malignant neoplasm of breast: Secondary | ICD-10-CM | POA: Insufficient documentation

## 2022-10-15 LAB — LACTATE DEHYDROGENASE: LDH: 119 U/L (ref 98–192)

## 2022-10-15 LAB — CBC WITH DIFFERENTIAL/PLATELET
Abs Immature Granulocytes: 0.01 10*3/uL (ref 0.00–0.07)
Basophils Absolute: 0 10*3/uL (ref 0.0–0.1)
Basophils Relative: 0 %
Eosinophils Absolute: 0.3 10*3/uL (ref 0.0–0.5)
Eosinophils Relative: 4 %
HCT: 43.1 % (ref 36.0–46.0)
Hemoglobin: 14 g/dL (ref 12.0–15.0)
Immature Granulocytes: 0 %
Lymphocytes Relative: 32 %
Lymphs Abs: 2.2 10*3/uL (ref 0.7–4.0)
MCH: 27.2 pg (ref 26.0–34.0)
MCHC: 32.5 g/dL (ref 30.0–36.0)
MCV: 83.9 fL (ref 80.0–100.0)
Monocytes Absolute: 0.6 10*3/uL (ref 0.1–1.0)
Monocytes Relative: 9 %
Neutro Abs: 3.8 10*3/uL (ref 1.7–7.7)
Neutrophils Relative %: 55 %
Platelets: 130 10*3/uL — ABNORMAL LOW (ref 150–400)
RBC: 5.14 MIL/uL — ABNORMAL HIGH (ref 3.87–5.11)
RDW: 15.3 % (ref 11.5–15.5)
Smear Review: NORMAL
WBC: 6.9 10*3/uL (ref 4.0–10.5)
nRBC: 0 % (ref 0.0–0.2)

## 2022-10-15 LAB — TECHNOLOGIST SMEAR REVIEW

## 2022-10-15 LAB — VITAMIN B12: Vitamin B-12: 241 pg/mL (ref 180–914)

## 2022-10-15 LAB — FOLATE: Folate: 21.8 ng/mL (ref >5.9)

## 2022-10-15 LAB — IMMATURE PLATELET FRACTION: Immature Platelet Fraction: 22.5 % — ABNORMAL HIGH (ref 1.2–8.6)

## 2022-10-15 NOTE — Assessment & Plan Note (Addendum)
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  For the work up of patient's thrombocytopenia, I recommend checking CBC;CMP, immature platelet fraction, LDH; smear review, folate, Vitamin B12, flowcytometry and monoclonal gammopathy workup.

## 2022-10-15 NOTE — Progress Notes (Signed)
Hematology/Oncology Consult Note [phone] [phone]    Patient Care Team: Jonetta Osgood, NP as PCP - General (Nurse Practitioner) Lavera Guise, MD (Internal Medicine)  REFERRING PROVIDER: Jonetta Osgood, NP  CHIEF COMPLAINTS/REASON FOR VISIT:  Evaluation of thrombocytopenia  ASSESSMENT & PLAN:   Thrombocytopenia ([place]) For the work up of patient's thrombocytopenia, I recommend checking CBC;CMP, immature platelet fraction, LDH; smear review, folate, Vitamin B12, flowcytometry and monoclonal gammopathy workup.     Orders Placed This Encounter  Procedures   CBC with Differential/Platelet    Standing Status:   Future    Number of Occurrences:   1    Standing Expiration Date:   10/16/2023   Vitamin B12    Standing Status:   Future    Number of Occurrences:   1    Standing Expiration Date:   10/16/2023   Folate    Standing Status:   Future    Number of Occurrences:   1    Standing Expiration Date:   10/16/2023   Multiple Myeloma Panel (SPEP&IFE w/QIG)    Standing Status:   Future    Number of Occurrences:   1    Standing Expiration Date:   10/16/2023   Kappa/lambda light chains    Standing Status:   Future    Number of Occurrences:   1    Standing Expiration Date:   10/16/2023   Flow cytometry panel-leukemia/lymphoma work-up    Standing Status:   Future    Number of Occurrences:   1    Standing Expiration Date:   10/16/2023   Lactate dehydrogenase    Standing Status:   Future    Number of Occurrences:   1    Standing Expiration Date:   10/16/2023   Immature Platelet Fraction    Standing Status:   Future    Number of Occurrences:   1    Standing Expiration Date:   10/16/2023   Technologist smear review    Standing Status:   Future    Number of Occurrences:   1    Standing Expiration Date:   10/16/2023    Order Specific Question:   Clinical information:    Answer:   thrombocytopenia   Follow up in a few weeks to review results.  All  questions were answered. The patient knows to call the clinic with any problems, questions or concerns.  Earlie Server, MD, PhD [facility] Hematology Oncology 10/15/2022    HISTORY OF PRESENTING ILLNESS:  Jill Banks is a 67 y.o. female who was seen in consultation at the request of Jonetta Osgood, NP for evaluation of    Reviewed patient's previous labs. Thrombocytopenia is chronic chronic onset , since at least 2020. Denies weight loss, fever, chills, fatigue, night sweats.  Denies hematochezia, hematuria, hematemesis, epistaxis, black tarry stool.  Patient has no easy bruising.    Denies history hepatitis or HIV infection Denies history of chronic liver disease Denies routine alcohol consumption. Denies dietary restrictions.      MEDICAL HISTORY:  Past Medical History:  Diagnosis Date   Anxiety    Arthritis    Bruises easily    COVID-19 04/2019   Diabetes mellitus without complication ([place])    Headache    HLD (hyperlipidemia)    Hypertension    Leg pain    Pneumonia    about 5 yrs ago   Sleep apnea     SURGICAL HISTORY: Past Surgical History:  Procedure Laterality Date  APPLICATION OF WOUND VAC 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   Procedure: APPLICATION OF WOUND VAC;  Surgeon: Hessie Knows, MD;  Location: [facility] ORS;  Service: Orthopedics;  Laterality: Left;  #[id number]   COLONOSCOPY WITH PROPOFOL N/A 09/25/2021   Procedure: COLONOSCOPY WITH PROPOFOL;  Surgeon: Lin Landsman, MD;  Location: [facility] ENDOSCOPY;  Service: Gastroenterology;  Laterality: N/A;   DILATATION & CURETTAGE/HYSTEROSCOPY WITH MYOSURE  10/17/2020   Procedure: DILATATION & CURETTAGE/HYSTEROSCOPY WITH MYOSURE POLYPECTOMY;  Surgeon: Homero Fellers, MD;  Location: [facility] ORS;  Service: Gynecology;;   DILATION AND CURETTAGE OF UTERUS     x 3   TOE SURGERY Left    second toe   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   Procedure: LEFT TOTAL KNEE ARTHROPLASTY;  Surgeon: Hessie Knows, MD;  Location: [facility] ORS;   Service: Orthopedics;  Laterality: Left;    SOCIAL HISTORY: Social History   Socioeconomic History   Marital status: Married    Spouse name: Not on file   Number of children: Not on file   Years of education: Not on file   Highest education level: Not on file  Occupational History   Not on file  Tobacco Use   Smoking status: Former    Packs/day: 0.25    Years: 2.00    Total pack years: 0.50    Types: Cigarettes    Quit date: 2009    Years since quitting: 15.0   Smokeless tobacco: Never  Vaping Use   Vaping Use: Never used  Substance and Sexual Activity   Alcohol use: Not Currently    Comment: ocassionally   Drug use: No   Sexual activity: Yes  Other Topics Concern   Not on file  Social History Narrative   Not on file   Social Determinants of Health   Financial Resource Strain: Not on file  Food Insecurity: No Food Insecurity (10/15/2022)   Hunger Vital Sign    Worried About Running Out of Food in the Last Year: Never true    Ran Out of Food in the Last Year: Never true  Transportation Needs: No Transportation Needs (10/15/2022)   PRAPARE - Hydrologist (Medical): No    Lack of Transportation (Non-Medical): No  Physical Activity: Not on file  Stress: Not on file  Social Connections: Not on file  Intimate Partner Violence: Not At Risk (10/15/2022)   Humiliation, Afraid, Rape, and Kick questionnaire    Fear of Current or Ex-Partner: No    Emotionally Abused: No    Physically Abused: No    Sexually Abused: No    FAMILY HISTORY: Family History  Problem Relation Age of Onset   Hypertension Father    Stroke Father    Hypertension Brother    Stroke Brother    Hyperlipidemia Brother    Breast cancer Maternal Aunt    Asthma Son     ALLERGIES:  has No Known Allergies.  MEDICATIONS:  Current Outpatient Medications  Medication Sig Dispense Refill   Accu-Chek Softclix Lancets lancets Use as instructed to check blood sugars once  daily 300 each 3   amLODipine (NORVASC) 5 MG tablet Take 1 tablet (5 mg total) by mouth daily. 30 tablet 2   bisoprolol (ZEBETA) 10 MG tablet Take 1 tablet (10 mg total) by mouth daily. 30 tablet 2   Blood Glucose Monitoring Suppl (ONETOUCH VERIO FLEX SYSTEM) w/Device KIT      calcitRIOL (ROCALTROL) 0.25 MCG capsule Take 1 capsule (0.25 mcg total) by mouth daily.  90 capsule 5   chlorthalidone (HYGROTON) 25 MG tablet TAKE 1 TABLET BY MOUTH ONCE DAILY. 90 tablet 1   diclofenac sodium (VOLTAREN) 1 % GEL Apply 2 g topically 4 (four) times daily as needed (knee pain).     glucose blood test strip Use as instructed to check blood sugars once daily E11.65 300 each 3   naproxen sodium (ALEVE) 220 MG tablet Take 440 mg by mouth 2 (two) times daily as needed (pain).     NON FORMULARY cpap device     potassium chloride SA (KLOR-CON M) 20 MEQ tablet TAKE 1 TABLET BY MOUTH TWICE DAILY 180 tablet 11   No current facility-administered medications for this visit.    Review of Systems  Constitutional:  Negative for appetite change, chills, fatigue and fever.  HENT:   Negative for hearing loss and voice change.   Eyes:  Negative for eye problems.  Respiratory:  Negative for chest tightness and cough.   Cardiovascular:  Negative for chest pain.  Gastrointestinal:  Negative for abdominal distention, abdominal pain and blood in stool.  Endocrine: Negative for hot flashes.  Genitourinary:  Negative for difficulty urinating and frequency.   Musculoskeletal:  Negative for arthralgias.  Skin:  Negative for itching and rash.  Neurological:  Negative for extremity weakness.  Hematological:  Negative for adenopathy.  Psychiatric/Behavioral:  Negative for confusion.     PHYSICAL EXAMINATION:  Vitals:   10/15/22 1127  BP: (!) 167/70  Pulse: 63  Resp: 18  Temp: (!) 96.6 F (35.9 C)   Filed Weights   10/15/22 1127  Weight: 265 lb 9.6 oz (120.5 kg)    Physical Exam Constitutional:      General: She is  not in acute distress. HENT:     Head: Normocephalic and atraumatic.  Eyes:     General: No scleral icterus. Cardiovascular:     Rate and Rhythm: Normal rate and regular rhythm.     Heart sounds: Normal heart sounds.  Pulmonary:     Effort: Pulmonary effort is normal. No respiratory distress.     Breath sounds: No wheezing.  Abdominal:     General: Bowel sounds are normal. There is no distension.     Palpations: Abdomen is soft.  Musculoskeletal:        General: No deformity. Normal range of motion.     Cervical back: Normal range of motion and neck supple.  Skin:    General: Skin is warm and dry.     Findings: No erythema or rash.  Neurological:     Mental Status: She is alert and oriented to person, place, and time. Mental status is at baseline.     Cranial Nerves: No cranial nerve deficit.     Coordination: Coordination normal.  Psychiatric:        Mood and Affect: Mood normal.      LABORATORY DATA:  I have reviewed the data as listed    Latest Ref Rng & Units 10/15/2022   11:40 AM 08/20/2022   11:56 AM 04/19/2021    9:52 AM  CBC  WBC 4.0 - 10.5 K/uL 6.9  8.1  7.9   Hemoglobin 12.0 - 15.0 g/dL 14.0  13.9  14.3   Hematocrit 36.0 - 46.0 % 43.1  41.9  43.5   Platelets 150 - 400 K/uL 130  134  144       Latest Ref Rng & Units 08/20/2022   11:56 AM 07/17/2021    1:56 PM 04/19/2021  9:52 AM  CMP  Glucose 70 - 99 mg/dL 102  104  117   BUN 8 - 27 mg/dL '12  13  14   '$ Creatinine 0.57 - 1.00 mg/dL 0.71  0.72  0.81   Sodium 134 - 144 mmol/L 136  139  140   Potassium 3.5 - 5.2 mmol/L 3.6  4.1  4.0   Chloride 96 - 106 mmol/L 99  99  98   CO2 20 - 29 mmol/L '25  27  26   '$ Calcium 8.7 - 10.3 mg/dL 9.6  9.9  10.2   Total Protein 6.0 - 8.5 g/dL 6.6  6.6  6.7   Total Bilirubin 0.0 - 1.2 mg/dL 0.5  0.3  0.4   Alkaline Phos 44 - 121 IU/L 67  74  73   AST 0 - 40 IU/L '18  17  23   '$ ALT 0 - 32 IU/L '15  13  17      '$ RADIOGRAPHIC STUDIES: I have personally reviewed the radiological  images as listed and agreed with the findings in the report.  No results found.

## 2022-10-16 LAB — KAPPA/LAMBDA LIGHT CHAINS
Kappa free light chain: 21.5 mg/L — ABNORMAL HIGH (ref 3.3–19.4)
Kappa, lambda light chain ratio: 1.76 — ABNORMAL HIGH (ref 0.26–1.65)
Lambda free light chains: 12.2 mg/L (ref 5.7–26.3)

## 2022-10-17 LAB — COMP PANEL: LEUKEMIA/LYMPHOMA

## 2022-10-21 ENCOUNTER — Ambulatory Visit (INDEPENDENT_AMBULATORY_CARE_PROVIDER_SITE_OTHER): Payer: Medicare HMO | Admitting: Orthopaedic Surgery

## 2022-10-21 ENCOUNTER — Ambulatory Visit (INDEPENDENT_AMBULATORY_CARE_PROVIDER_SITE_OTHER): Payer: Medicare HMO

## 2022-10-21 VITALS — Ht 66.0 in | Wt 260.0 lb

## 2022-10-21 DIAGNOSIS — M25561 Pain in right knee: Secondary | ICD-10-CM

## 2022-10-21 DIAGNOSIS — M1711 Unilateral primary osteoarthritis, right knee: Secondary | ICD-10-CM | POA: Diagnosis not present

## 2022-10-21 DIAGNOSIS — G8929 Other chronic pain: Secondary | ICD-10-CM

## 2022-10-21 LAB — MULTIPLE MYELOMA PANEL, SERUM
Albumin SerPl Elph-Mcnc: 3.5 g/dL (ref 2.9–4.4)
Albumin/Glob SerPl: 1.3 (ref 0.7–1.7)
Alpha 1: 0.2 g/dL (ref 0.0–0.4)
Alpha2 Glob SerPl Elph-Mcnc: 0.7 g/dL (ref 0.4–1.0)
B-Globulin SerPl Elph-Mcnc: 1 g/dL (ref 0.7–1.3)
Gamma Glob SerPl Elph-Mcnc: 1 g/dL (ref 0.4–1.8)
Globulin, Total: 2.9 g/dL (ref 2.2–3.9)
IgA: 193 mg/dL (ref 87–352)
IgG (Immunoglobin G), Serum: 1066 mg/dL (ref 586–1602)
IgM (Immunoglobulin M), Srm: 103 mg/dL (ref 26–217)
Total Protein ELP: 6.4 g/dL (ref 6.0–8.5)

## 2022-10-21 NOTE — Progress Notes (Signed)
The patient is a very pleasant and active 67 year old female who has well-documented debilitating arthritis of her right knee.  She actually has a history of a left knee replacement done I believe in [place] in 2020.  She says that knee is doing well but her right knee has been worsening with pain and swelling for many years now.  It has been at least 3 years.  She does not have any history of surgery on that knee but she has had injections of steroids in the past and other conservative treatment modalities.  She is work on weight loss.  She is worked on activity modification.  She works on Forensic scientist regularly.  She has to avoid anti-inflammatories due to history of thrombocytopenia in the past.  She is diabetic but has good control of her diabetes she states.  At this point her left knee pain is daily to the point she wishes to proceed with a knee replacement on the right side given the success of her left knee replacement.  I was able to review all of her medications and medical history within epic and discussed these with her as well.  Examination of her right knee shows varus malalignment.  There is significant patellofemoral crepitation as well as medial lateral joint line tenderness throughout the arc of motion of the knee.  X-rays of the right knee show tricompartment arthritis involving all 3 compartments.  There is joint space narrowing as well as osteophytes in all 3 compartments and this is bone-on-bone wear.  We did talk in length in detail again about knee replacement surgery.  She is fully aware of the risks and benefits of the surgery as well as what to expect from an intraoperative and postoperative course having this done with her left knee.  We will work on getting this scheduled in the near future.  All questions and concerns were answered and addressed.

## 2022-10-22 ENCOUNTER — Encounter: Payer: Self-pay | Admitting: Internal Medicine

## 2022-10-22 ENCOUNTER — Ambulatory Visit: Payer: Medicare HMO | Admitting: Internal Medicine

## 2022-10-22 VITALS — BP 161/75 | HR 74 | Temp 98.2°F | Resp 16 | Ht 66.0 in | Wt 264.2 lb

## 2022-10-22 DIAGNOSIS — G4733 Obstructive sleep apnea (adult) (pediatric): Secondary | ICD-10-CM | POA: Diagnosis not present

## 2022-10-22 DIAGNOSIS — G8929 Other chronic pain: Secondary | ICD-10-CM | POA: Diagnosis not present

## 2022-10-22 DIAGNOSIS — M25561 Pain in right knee: Secondary | ICD-10-CM | POA: Diagnosis not present

## 2022-10-22 NOTE — Progress Notes (Signed)
[facility] [place], [place] [address]  Pulmonary Sleep Medicine   Office Visit Note  Patient Name: Jill Banks DOB: Feb 14, 1956 MRN [id number]  Date of Service: 10/22/2022  Complaints/HPI: She states she is not using the CPAP machine states that she sleeps on her back and feels as though she has been tied down to the machine. She states she has not been using the PAP for the last year. . Does not appear to be having someone involved with seeing her in the sleep clinic frequently. She gets her equipment basically online.  ROS  General: (-) fever, (-) chills, (-) night sweats, (-) weakness Skin: (-) rashes, (-) itching,. Eyes: (-) visual changes, (-) redness, (-) itching. Nose and Sinuses: (-) nasal stuffiness or itchiness, (-) postnasal drip, (-) nosebleeds, (-) sinus trouble. Mouth and Throat: (-) sore throat, (-) hoarseness. Neck: (-) swollen glands, (-) enlarged thyroid, (-) neck pain. Respiratory: - cough, (-) bloody sputum, - shortness of breath, - wheezing. Cardiovascular: - ankle swelling, (-) chest pain. Lymphatic: (-) lymph node enlargement. Neurologic: (-) numbness, (-) tingling. Psychiatric: (-) anxiety, (-) depression   Current Medication: Outpatient Encounter Medications as of 10/22/2022  Medication Sig   Accu-Chek Softclix Lancets lancets Use as instructed to check blood sugars once daily   amLODipine (NORVASC) 5 MG tablet Take 1 tablet (5 mg total) by mouth daily.   bisoprolol (ZEBETA) 10 MG tablet Take 1 tablet (10 mg total) by mouth daily.   Blood Glucose Monitoring Suppl (ONETOUCH VERIO FLEX SYSTEM) w/Device KIT    calcitRIOL (ROCALTROL) 0.25 MCG capsule Take 1 capsule (0.25 mcg total) by mouth daily.   chlorthalidone (HYGROTON) 25 MG tablet TAKE 1 TABLET BY MOUTH ONCE DAILY.   diclofenac sodium (VOLTAREN) 1 % GEL Apply 2 g topically 4 (four) times daily as needed (knee pain).   glucose blood test strip Use as instructed to check  blood sugars once daily E11.65   naproxen sodium (ALEVE) 220 MG tablet Take 440 mg by mouth 2 (two) times daily as needed (pain).   NON FORMULARY cpap device   potassium chloride SA (KLOR-CON M) 20 MEQ tablet TAKE 1 TABLET BY MOUTH TWICE DAILY   No facility-administered encounter medications on file as of 10/22/2022.    Surgical History: Past Surgical History:  Procedure Laterality Date   APPLICATION OF WOUND VAC 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   Procedure: APPLICATION OF WOUND VAC;  Surgeon: Hessie Knows, MD;  Location: [facility] ORS;  Service: Orthopedics;  Laterality: Left;  #[id number]   COLONOSCOPY WITH PROPOFOL N/A 09/25/2021   Procedure: COLONOSCOPY WITH PROPOFOL;  Surgeon: Lin Landsman, MD;  Location: [facility] ENDOSCOPY;  Service: Gastroenterology;  Laterality: N/A;   DILATATION & CURETTAGE/HYSTEROSCOPY WITH MYOSURE  10/17/2020   Procedure: DILATATION & CURETTAGE/HYSTEROSCOPY WITH MYOSURE POLYPECTOMY;  Surgeon: Homero Fellers, MD;  Location: [facility] ORS;  Service: Gynecology;;   DILATION AND CURETTAGE OF UTERUS     x 3   TOE SURGERY Left    second toe   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   Procedure: LEFT TOTAL KNEE ARTHROPLASTY;  Surgeon: Hessie Knows, MD;  Location: [facility] ORS;  Service: Orthopedics;  Laterality: Left;    Medical History: Past Medical History:  Diagnosis Date   Anxiety    Arthritis    Bruises easily    COVID-19 04/2019   Diabetes mellitus without complication (HCC)    Headache    HLD (hyperlipidemia)    Hypertension    Leg pain  Pneumonia    about 5 yrs ago   Sleep apnea     Family History: Family History  Problem Relation Age of Onset   Hypertension Father    Stroke Father    Hypertension Brother    Stroke Brother    Hyperlipidemia Brother    Breast cancer Maternal Aunt    Asthma Son     Social History: Social History   Socioeconomic History   Marital status: Married    Spouse name: Not on file   Number of children: Not on file   Years of  education: Not on file   Highest education level: Not on file  Occupational History   Not on file  Tobacco Use   Smoking status: Former    Packs/day: 0.25    Years: 2.00    Total pack years: 0.50    Types: Cigarettes    Quit date: 2009    Years since quitting: 15.1   Smokeless tobacco: Never  Vaping Use   Vaping Use: Never used  Substance and Sexual Activity   Alcohol use: Not Currently    Comment: ocassionally   Drug use: No   Sexual activity: Yes  Other Topics Concern   Not on file  Social History Narrative   Not on file   Social Determinants of Health   Financial Resource Strain: Not on file  Food Insecurity: No Food Insecurity (10/15/2022)   Hunger Vital Sign    Worried About Running Out of Food in the Last Year: Never true    Ran Out of Food in the Last Year: Never true  Transportation Needs: No Transportation Needs (10/15/2022)   PRAPARE - Hydrologist (Medical): No    Lack of Transportation (Non-Medical): No  Physical Activity: Not on file  Stress: Not on file  Social Connections: Not on file  Intimate Partner Violence: Not At Risk (10/15/2022)   Humiliation, Afraid, Rape, and Kick questionnaire    Fear of Current or Ex-Partner: No    Emotionally Abused: No    Physically Abused: No    Sexually Abused: No    Vital Signs: Blood pressure (!) 161/75, pulse 74, temperature 98.2 F (36.8 C), resp. rate 16, height '5\' 6"'$  (1.676 m), weight 264 lb 3.2 oz (119.8 kg), SpO2 96 %.  Examination: General Appearance: The patient is well-developed, well-nourished, and in no distress. Skin: Gross inspection of skin unremarkable. Head: normocephalic, no gross deformities. Eyes: no gross deformities noted. ENT: ears appear grossly normal no exudates. Neck: Supple. No thyromegaly. No LAD. Respiratory: no rhonchi noted. Cardiovascular: Normal S1 and S2 without murmur or rub. Extremities: No cyanosis. pulses are equal. Neurologic: Alert and  oriented. No involuntary movements.  LABS: Recent Results (from the past 2160 hour(s))  POCT glycosylated hemoglobin (Hb A1C)     Status: Abnormal   Collection Time: 08/20/22 11:07 AM  Result Value Ref Range   Hemoglobin A1C 6.6 (A) 4.0 - 5.6 %   HbA1c POC (<> result, manual entry)     HbA1c, POC (prediabetic range)     HbA1c, POC (controlled diabetic range)    CBC with Differential/Platelet     Status: Abnormal   Collection Time: 08/20/22 11:56 AM  Result Value Ref Range   WBC 8.1 3.4 - 10.8 x10E3/uL   RBC 5.05 3.77 - 5.28 x10E6/uL   Hemoglobin 13.9 11.1 - 15.9 g/dL   Hematocrit 41.9 34.0 - 46.6 %   MCV 83 79 - 97 fL  MCH 27.5 26.6 - 33.0 pg   MCHC 33.2 31.5 - 35.7 g/dL   RDW 14.5 11.7 - 15.4 %   Platelets 134 (L) 150 - 450 x10E3/uL   Neutrophils 56 Not Estab. %   Lymphs 31 Not Estab. %   Monocytes 9 Not Estab. %   Eos 3 Not Estab. %   Basos 1 Not Estab. %   Neutrophils Absolute 4.5 1.4 - 7.0 x10E3/uL   Lymphocytes Absolute 2.6 0.7 - 3.1 x10E3/uL   Monocytes Absolute 0.7 0.1 - 0.9 x10E3/uL   EOS (ABSOLUTE) 0.3 0.0 - 0.4 x10E3/uL   Basophils Absolute 0.0 0.0 - 0.2 x10E3/uL   Immature Granulocytes 0 Not Estab. %   Immature Grans (Abs) 0.0 0.0 - 0.1 x10E3/uL  CMP14+EGFR     Status: Abnormal   Collection Time: 08/20/22 11:56 AM  Result Value Ref Range   Glucose 102 (H) 70 - 99 mg/dL   BUN 12 8 - 27 mg/dL   Creatinine, Ser 0.71 0.57 - 1.00 mg/dL   eGFR 94 >59 mL/min/1.73   BUN/Creatinine Ratio 17 12 - 28   Sodium 136 134 - 144 mmol/L   Potassium 3.6 3.5 - 5.2 mmol/L   Chloride 99 96 - 106 mmol/L   CO2 25 20 - 29 mmol/L   Calcium 9.6 8.7 - 10.3 mg/dL   Total Protein 6.6 6.0 - 8.5 g/dL   Albumin 4.3 3.9 - 4.9 g/dL   Globulin, Total 2.3 1.5 - 4.5 g/dL   Albumin/Globulin Ratio 1.9 1.2 - 2.2   Bilirubin Total 0.5 0.0 - 1.2 mg/dL   Alkaline Phosphatase 67 44 - 121 IU/L   AST 18 0 - 40 IU/L   ALT 15 0 - 32 IU/L  Lipid Profile     Status: Abnormal   Collection Time:  08/20/22 11:56 AM  Result Value Ref Range   Cholesterol, Total 192 100 - 199 mg/dL   Triglycerides 119 0 - 149 mg/dL   HDL 54 >39 mg/dL   VLDL Cholesterol Cal 21 5 - 40 mg/dL   LDL Chol Calc (NIH) 117 (H) 0 - 99 mg/dL   Chol/HDL Ratio 3.6 0.0 - 4.4 ratio    Comment:                                   T. Chol/HDL Ratio                                             Men  Women                               1/2 Avg.Risk  3.4    3.3                                   Avg.Risk  5.0    4.4                                2X Avg.Risk  9.6    7.1  3X Avg.Risk 23.4   11.0   Urine Microalbumin w/creat. ratio     Status: None   Collection Time: 08/20/22  3:19 PM  Result Value Ref Range   Creatinine, Urine 118.0 Not Estab. mg/dL   Microalbumin, Urine 9.5 Not Estab. ug/mL   Microalb/Creat Ratio 8 0 - 29 mg/g creat    Comment:                        Normal:                0 -  29                        Moderately increased: 30 - 300                        Severely increased:       >300   UA/M w/rflx Culture, Routine     Status: Abnormal   Collection Time: 08/20/22  3:19 PM   Specimen: Urine   Urine  Result Value Ref Range   Specific Gravity, UA 1.019 1.005 - 1.030   pH, UA 7.0 5.0 - 7.5   Color, UA Yellow Yellow   Appearance Ur Clear Clear   Leukocytes,UA 1+ (A) Negative   Protein,UA Negative Negative/Trace   Glucose, UA Negative Negative   Ketones, UA Negative Negative   RBC, UA Negative Negative   Bilirubin, UA Negative Negative   Urobilinogen, Ur 1.0 0.2 - 1.0 mg/dL   Nitrite, UA Negative Negative   Microscopic Examination See below:     Comment: Microscopic was indicated and was performed.   Urinalysis Reflex Comment     Comment: This specimen has reflexed to a Urine Culture.  Microscopic Examination     Status: Abnormal   Collection Time: 08/20/22  3:19 PM   Urine  Result Value Ref Range   WBC, UA 11-30 (A) 0 - 5 /hpf   RBC, Urine 0-2 0 - 2 /hpf    Epithelial Cells (non renal) 0-10 0 - 10 /hpf   Casts None seen None seen /lpf   Bacteria, UA None seen None seen/Few  Urine Culture, Reflex     Status: Abnormal   Collection Time: 08/20/22  3:19 PM   Urine  Result Value Ref Range   Urine Culture, Routine Final report (A)    Organism ID, Bacteria Citrobacter farmeri (A)     Comment: 25,000-50,000 colony forming units per mL   ORGANISM ID, BACTERIA Enterococcus faecalis (A)     Comment: For Enterococcus species, aminoglycosides (except for high-level resistance screening), cephalosporins, clindamycin, and trimethoprim-sulfamethoxazole are not effective clinically. (CLSI, M100-S26, 2016) 50,000-100,000 colony forming units per mL    Antimicrobial Susceptibility Comment     Comment:       ** S = Susceptible; I = Intermediate; R = Resistant **                    P = Positive; N = Negative             MICS are expressed in micrograms per mL    Antibiotic                 RSLT#1    RSLT#2    RSLT#3    RSLT#4 Amoxicillin/Clavulanic Acid    S Cefazolin  R Cefepime                       S Ceftriaxone                    S Cefuroxime                     I Ciprofloxacin                  S         S Gentamicin                     S Imipenem                       S Levofloxacin                             S Meropenem                      S Nitrofurantoin                 S         S Penicillin                               S Tetracycline                   S         R Tobramycin                     S Trimethoprim/Sulfa             S Vancomycin                               S   Technologist smear review     Status: None   Collection Time: 10/15/22 11:38 AM  Result Value Ref Range   Plt Morphology See CBC/diff from 01.30.2024    Clinical Information thrombocytopenia     Comment: Performed at [facility], [address] [place], [place] [address]  Immature Platelet Fraction     Status: Abnormal   Collection  Time: 10/15/22 11:40 AM  Result Value Ref Range   Immature Platelet Fraction 22.5 (H) 1.2 - 8.6 %    Comment:        An elevated IPF indicates increased platelet production. A low platelet count with an elevated IPF may be associated with peripheral platelet destruction (e.g. DIC, ITP) or bone marrow recovery (e.g. after chemotherapy or transplant). A low platelet count with a low or non- elevated IPF is consistent with a platelet production disorder. Performed at [facility], [address] [place], [place] [address]   Lactate dehydrogenase     Status: None   Collection Time: 10/15/22 11:40 AM  Result Value Ref Range   LDH 119 98 - 192 U/L    Comment: Performed at [facility], [address] [place], [place] [address]  Flow cytometry panel-leukemia/lymphoma work-up     Status: None   Collection Time: 10/15/22 11:40 AM  Result Value Ref Range   PATH INTERP XXX-IMP Comment     Comment: No significant immunophenotypic abnormality detected  CLINICAL INFO Comment     Comment: (NOTE) Accompanying CBC dated 10-15-22 shows: WBC count 6.9, Neu 3.8, Lym 2.2, Mon 0.6.    Specimen Type Comment     Comment: Peripheral blood   ASSESSMENT OF LEUKOCYTES Comment     Comment: (NOTE) No monoclonal B cell population is detected. kappa:lambda ratio 1.9 There is no loss of, or aberrant expression of, the pan T cell antigens to suggest a neoplastic T cell process. CD4:CD8 ratio 3.5 No circulating blasts are detected. There is no immunophenotypic  evidence of abnormal myeloid maturation. Analysis of the leukocyte population shows: granulocytes 63%, monocytes 7%, lymphocytes 30%, blasts <0.1%, B cells 6%, T cells 22%, NK cells 2%.    % Viable Cells Comment     Comment: 97%   ANALYSIS AND GATING STRATEGY Comment     Comment: (NOTE) 8 color analysis with CD45/SSC  Technical-Analysis performed at [facility], [address] [place],  [place] [address] Director: Katina Degree, MDPhD    IMMUNOPHENOTYPING STUDY Comment     Comment: (NOTE) CD2       Normal         CD3       Normal CD4       Normal         CD5       Normal CD7       Normal         CD8       Normal CD10      Normal         CD11b     Normal CD13      Normal         CD14      Normal CD16      Normal         CD19      Normal CD20      Normal         CD33      Normal CD34      Normal         CD38      Normal CD45      Normal         CD56      Normal CD57      Normal         CD117     Normal HLA-DR    Normal         KAPPA     Normal LAMBDA    Normal         CD64      Normal    PATHOLOGIST NAME Comment     Comment: Henrietta Hoover, M.D.   COMMENT: Comment     Comment: (NOTE) Each antibody in this assay was utilized to assess for potential abnormalities of studied cell populations or to characterize identified abnormalities. This test was developed and its performance characteristics determined by [facility].  It has not been cleared or approved by the U.S. Food and Drug Administration. The FDA has determined that such clearance or approval is not necessary. This test is used for clinical purposes.  It should not be regarded as investigational or for research. Performed At: [facility] [address] [place], [place] [id number] Katina Degree MDPhD IR:[id number] Performed At: [facility] [address] [place], [place] [id number] Katina Degree MDPhD IZ:[id number]   Kappa/lambda light chains     Status: Abnormal   Collection Time: 10/15/22 11:40 AM  Result Value Ref Range   Kappa free light chain 21.5 (H) 3.3 - 19.4 mg/L   Lambda free light chains 12.2 5.7 - 26.3 mg/L   Kappa, lambda light chain ratio 1.76 (H) 0.26 - 1.65    Comment: (NOTE) Performed At: [facility] [place], [place] [id number] Rush Farmer MD [id number]   Folate     Status: None   Collection Time: 10/15/22 11:40 AM  Result Value Ref Range   Folate 21.8 >5.9  ng/mL    Comment: Performed at [facility], [address] [place], [place] [address]  CBC with Differential/Platelet     Status: Abnormal   Collection Time: 10/15/22 11:40 AM  Result Value Ref Range   WBC 6.9 4.0 - 10.5 K/uL    Comment: WHITE COUNT CONFIRMED ON SMEAR   RBC 5.14 (H) 3.87 - 5.11 MIL/uL   Hemoglobin 14.0 12.0 - 15.0 g/dL   HCT 43.1 36.0 - 46.0 %   MCV 83.9 80.0 - 100.0 fL   MCH 27.2 26.0 - 34.0 pg   MCHC 32.5 30.0 - 36.0 g/dL   RDW 15.3 11.5 - 15.5 %   Platelets 130 (L) 150 - 400 K/uL   nRBC 0.0 0.0 - 0.2 %   Neutrophils Relative % 55 %   Neutro Abs 3.8 1.7 - 7.7 K/uL   Lymphocytes Relative 32 %   Lymphs Abs 2.2 0.7 - 4.0 K/uL   Monocytes Relative 9 %   Monocytes Absolute 0.6 0.1 - 1.0 K/uL   Eosinophils Relative 4 %   Eosinophils Absolute 0.3 0.0 - 0.5 K/uL   Basophils Relative 0 %   Basophils Absolute 0.0 0.0 - 0.1 K/uL   WBC Morphology DIFF CONFIRMED, MORPHOLOGY UNREMARKABLE    RBC Morphology UNREMARKABLE    Smear Review Normal platelet morphology     Comment: PLATELETS APPEAR DECREASED   Immature Granulocytes 0 %   Abs Immature Granulocytes 0.01 0.00 - 0.07 K/uL    Comment: Performed at [facility], [address] [place], [place] [address]  Multiple Myeloma Panel (SPEP&IFE w/QIG)     Status: None   Collection Time: 10/15/22 11:41 AM  Result Value Ref Range   IgG (Immunoglobin G), Serum 1,066 586 - 1,602 mg/dL   IgA 193 87 - 352 mg/dL   IgM (Immunoglobulin M), Srm 103 26 - 217 mg/dL   Total Protein ELP 6.4 6.0 - 8.5 g/dL   Albumin SerPl Elph-Mcnc 3.5 2.9 - 4.4 g/dL   Alpha 1 0.2 0.0 - 0.4 g/dL   Alpha2 Glob SerPl Elph-Mcnc 0.7 0.4 - 1.0 g/dL   B-Globulin SerPl Elph-Mcnc 1.0 0.7 - 1.3 g/dL   Gamma Glob SerPl Elph-Mcnc 1.0 0.4 - 1.8 g/dL   M Protein SerPl Elph-Mcnc Not Observed Not Observed g/dL   Globulin, Total 2.9 2.2 - 3.9 g/dL   Albumin/Glob SerPl 1.3 0.7 - 1.7   IFE 1 Comment     Comment: (NOTE) The immunofixation pattern  appears unremarkable. Evidence of monoclonal protein is not apparent.    Please Note Comment     Comment: (NOTE) Protein electrophoresis scan will follow via computer, mail, or courier delivery. Performed At: [facility] [place], [place] [id number] Rush Farmer MD IH:[id number]   Vitamin B12     Status: None   Collection Time: 10/15/22 11:41 AM  Result Value Ref Range   Vitamin B-12 241 180 - 914 pg/mL    Comment: (NOTE) This assay is not validated  for testing neonatal or myeloproliferative syndrome specimens for Vitamin B12 levels. Performed at [facility], [place] [address] [place], [place] [address]     Radiology: No results found.  No results found.  No results found.    Assessment and Plan: Patient Active Problem List   Diagnosis Date Noted   Unilateral primary osteoarthritis, right knee 10/21/2022   Thrombocytopenia ([place]) 10/15/2022   Endometrial polyp    Postmenopausal bleeding    Uterine enlargement 09/17/2020   BMI 40.0-44.9, adult ([place]) 09/17/2020   Uterine leiomyoma 08/26/2020   Aortic atherosclerosis ([place]) 08/26/2020   Diastasis of rectus abdominis 08/08/2020   Encounter for health maintenance examination with abnormal findings 07/05/2020   Bone spur of right ankle 07/05/2020   S/P TKR (total knee replacement) using cement, left 07/27/2019   Encounter for general adult medical examination with abnormal findings 07/01/2019   Adjustment insomnia 07/01/2019   Encounter for screening mammogram for malignant neoplasm of breast 07/01/2019   Routine cervical smear 07/01/2019   Ventral hernia without obstruction or gangrene 07/01/2019   Pain and swelling of ankle, right 01/13/2019   Acute vaginitis 01/11/2018   Bacterial vaginitis 12/18/2017   Inflammatory polyarthritis ([place]) 12/18/2017   Impaired fasting glucose 09/23/2017   Obesity, unspecified 09/23/2017   Hypokalemia 09/23/2017   Melanocytic nevi, unspecified 09/23/2017    Pain in right knee 09/23/2017   Dysuria 09/23/2017   Pain in right ankle and joints of right foot 09/23/2017   Essential hypertension 09/23/2017   Other fatigue 09/23/2017   Vitamin D deficiency, unspecified 09/23/2017   Iron deficiency anemia, unspecified 09/23/2017   OSA on CPAP 09/23/2017    1. OSA (obstructive sleep apnea) Needs to be seen in the sleep clinic so she can have a therapist go over proper usage of the device  2. Obesity, morbid ([place]) Obesity Counseling: Had a lengthy discussion regarding patients BMI and weight issues. Patient was instructed on portion control as well as increased activity. Also discussed caloric restrictions with trying to maintain intake less than 2000 Kcal. Discussions were made in accordance with the 5As of weight management. Simple actions such as not eating late and if able to, taking a walk is suggested.   3. Chronic pain of right knee She is thinking about having surgery on the other knee   General Counseling: I have discussed the findings of the evaluation and examination with Jill Banks.  I have also discussed any further diagnostic evaluation thatmay be needed or ordered today. Jill Banks verbalizes understanding of the findings of todays visit. We also reviewed her medications today and discussed drug interactions and side effects including but not limited excessive drowsiness and altered mental states. We also discussed that there is always a risk not just to her but also people around her. she has been encouraged to call the office with any questions or concerns that should arise related to todays visit.  No orders of the defined types were placed in this encounter.    Time spent: 45  I have personally obtained a history, examined the patient, evaluated laboratory and imaging results, formulated the assessment and plan and placed orders.    Allyne Gee, MD [facility] Pulmonary and Critical Care Sleep medicine

## 2022-10-22 NOTE — Patient Instructions (Signed)

## 2022-10-24 ENCOUNTER — Telehealth: Payer: Self-pay | Admitting: Internal Medicine

## 2022-10-24 NOTE — Telephone Encounter (Signed)
Cpap support/service  order placed in [facility] folder-Toni

## 2022-10-24 NOTE — Addendum Note (Signed)
Addended by: Devona Konig A on: 10/24/2022 11:46 AM   Modules accepted: Orders

## 2022-11-14 ENCOUNTER — Inpatient Hospital Stay: Payer: Medicare HMO | Attending: Oncology | Admitting: Oncology

## 2022-11-14 ENCOUNTER — Encounter: Payer: Self-pay | Admitting: Oncology

## 2022-11-14 VITALS — BP 145/70 | HR 73 | Temp 98.2°F | Resp 18 | Wt 265.9 lb

## 2022-11-14 DIAGNOSIS — D696 Thrombocytopenia, unspecified: Secondary | ICD-10-CM | POA: Insufficient documentation

## 2022-11-14 DIAGNOSIS — Z87891 Personal history of nicotine dependence: Secondary | ICD-10-CM | POA: Diagnosis not present

## 2022-11-14 MED ORDER — VITAMIN B-12 1000 MCG PO TABS: 1000.0000 ug | ORAL_TABLET | Freq: Every day | ORAL | 1 refills | Status: DC

## 2022-11-14 NOTE — Progress Notes (Signed)
Pt here for follow up. No new concerns voiced.   

## 2022-11-14 NOTE — Progress Notes (Signed)
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  Slightly increased light chain ratio, nonspecific. Recommend patient to take empiric vitamin B12 1000 mcg daily. Also check 24-hour urine protein electrophoresis.   Orders Placed This Encounter  Procedures   CBC with Differential ([place] Only)    Standing Status:   Future    Standing Expiration Date:   11/14/2023   Technologist smear review    Standing Status:   Future    Standing Expiration Date:   11/14/2023    Order Specific Question:   Clinical information:    Answer:   thrombocytopenia   Vitamin B12    Standing Status:   Future    Standing Expiration Date:   11/14/2023   IFE+PROTEIN ELECTRO, 24-HR UR    Standing Status:   Future    Standing Expiration Date:   11/14/2023   Follow up 6 months All questions were answered. The patient knows to call the clinic with any problems, questions or concerns.  Earlie Server, MD, PhD [facility] Hematology Oncology 11/14/2022    HISTORY OF PRESENTING ILLNESS:  Jill Banks is a 67 y.o. female who was seen in consultation at the request of Jonetta Osgood, NP for evaluation of  Thrombocytopenia  Reviewed patient's previous labs. Thrombocytopenia is chronic chronic onset , since at least 2020. Denies weight loss, fever, chills, fatigue, night sweats.  Denies hematochezia, hematuria, hematemesis, epistaxis, black tarry stool.  Patient has no easy bruising.    Denies history hepatitis or HIV infection Denies history of chronic liver disease Denies routine alcohol consumption. Denies dietary restrictions.     INTERVAL HISTORY Jill Banks is a 67 y.o. female who has above history reviewed by me today presents for follow up visit for thrombocytopenia patient presents to discuss results.  She has no new complaints.   MEDICAL HISTORY:  Past Medical History:  Diagnosis Date   Anxiety    Arthritis    Bruises easily    COVID-19 04/2019   Diabetes mellitus without complication ([place])    Headache    HLD (hyperlipidemia)    Hypertension    Leg pain    Pneumonia    about 5 yrs ago   Sleep apnea     SURGICAL HISTORY: Past Surgical History:  Procedure Laterality Date   APPLICATION OF WOUND VAC 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   Procedure: APPLICATION OF WOUND VAC;  Surgeon: Hessie Knows, MD;  Location: [facility] ORS;  Service: Orthopedics;  Laterality: Left;  [id number]   COLONOSCOPY WITH PROPOFOL N/A 09/25/2021   Procedure: COLONOSCOPY WITH PROPOFOL;  Surgeon: Lin Landsman, MD;  Location: [facility] ENDOSCOPY;  Service: Gastroenterology;  Laterality: N/A;   DILATATION & CURETTAGE/HYSTEROSCOPY WITH MYOSURE  10/17/2020   Procedure: DILATATION & CURETTAGE/HYSTEROSCOPY WITH MYOSURE POLYPECTOMY;  Surgeon: Homero Fellers, MD;  Location: [facility] ORS;  Service: Gynecology;;   DILATION AND CURETTAGE OF UTERUS     x 3   TOE SURGERY Left    second toe   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   Procedure: LEFT TOTAL KNEE ARTHROPLASTY;  Surgeon: Hessie Knows, MD;  Location: [facility] ORS;  Service: Orthopedics;  Laterality: Left;    SOCIAL HISTORY: Social History   Socioeconomic History   Marital status: Married  Spouse name: Not on file   Number of children: Not on file   Years of education: Not on file   Highest education level: Not on file  Occupational History   Not on file  Tobacco Use   Smoking status: Former    Packs/day: 0.25    Years: 2.00    Total pack years: 0.50    Types: Cigarettes    Quit date: 2009    Years since quitting: 15.1   Smokeless tobacco: Never  Vaping Use   Vaping Use:  Never used  Substance and Sexual Activity   Alcohol use: Not Currently    Comment: ocassionally   Drug use: No   Sexual activity: Yes  Other Topics Concern   Not on file  Social History Narrative   Not on file   Social Determinants of Health   Financial Resource Strain: Not on file  Food Insecurity: No Food Insecurity (10/15/2022)   Hunger Vital Sign    Worried About Running Out of Food in the Last Year: Never true    Ran Out of Food in the Last Year: Never true  Transportation Needs: No Transportation Needs (10/15/2022)   PRAPARE - Hydrologist (Medical): No    Lack of Transportation (Non-Medical): No  Physical Activity: Not on file  Stress: Not on file  Social Connections: Not on file  Intimate Partner Violence: Not At Risk (10/15/2022)   Humiliation, Afraid, Rape, and Kick questionnaire    Fear of Current or Ex-Partner: No    Emotionally Abused: No    Physically Abused: No    Sexually Abused: No    FAMILY HISTORY: Family History  Problem Relation Age of Onset   Hypertension Father    Stroke Father    Hypertension Brother    Stroke Brother    Hyperlipidemia Brother    Breast cancer Maternal Aunt    Asthma Son     ALLERGIES:  has No Known Allergies.  MEDICATIONS:  Current Outpatient Medications  Medication Sig Dispense Refill   Accu-Chek Softclix Lancets lancets Use as instructed to check blood sugars once daily 300 each 3   amLODipine (NORVASC) 5 MG tablet Take 1 tablet (5 mg total) by mouth daily. 30 tablet 2   bisoprolol (ZEBETA) 10 MG tablet Take 1 tablet (10 mg total) by mouth daily. 30 tablet 2   Blood Glucose Monitoring Suppl (ONETOUCH VERIO FLEX SYSTEM) w/Device KIT      calcitRIOL (ROCALTROL) 0.25 MCG capsule Take 1 capsule (0.25 mcg total) by mouth daily. 90 capsule 5   chlorthalidone (HYGROTON) 25 MG tablet TAKE 1 TABLET BY MOUTH ONCE DAILY. 90 tablet 1   cyanocobalamin (VITAMIN B12) 1000 MCG tablet Take 1 tablet (1,000 mcg  total) by mouth daily. 90 tablet 1   diclofenac sodium (VOLTAREN) 1 % GEL Apply 2 g topically 4 (four) times daily as needed (knee pain).     glucose blood test strip Use as instructed to check blood sugars once daily E11.65 300 each 3   naproxen sodium (ALEVE) 220 MG tablet Take 440 mg by mouth 2 (two) times daily as needed (pain).     NON FORMULARY cpap device     potassium chloride SA (KLOR-CON M) 20 MEQ tablet TAKE 1 TABLET BY MOUTH TWICE DAILY 180 tablet 11   No current facility-administered medications for this visit.    Review of Systems  Constitutional:  Negative for appetite change, chills, fatigue and fever.  HENT:  Negative for hearing loss and voice change.   Eyes:  Negative for eye problems.  Respiratory:  Negative for chest tightness and cough.   Cardiovascular:  Negative for chest pain.  Gastrointestinal:  Negative for abdominal distention, abdominal pain and blood in stool.  Endocrine: Negative for hot flashes.  Genitourinary:  Negative for difficulty urinating and frequency.   Musculoskeletal:  Negative for arthralgias.  Skin:  Negative for itching and rash.  Neurological:  Negative for extremity weakness.  Hematological:  Negative for adenopathy.  Psychiatric/Behavioral:  Negative for confusion.     PHYSICAL EXAMINATION:  Vitals:   11/14/22 1040  BP: (!) 145/70  Pulse: 73  Resp: 18  Temp: 98.2 F (36.8 C)   Filed Weights   11/14/22 1040  Weight: 265 lb 14.4 oz (120.6 kg)    Physical Exam Constitutional:      General: She is not in acute distress. HENT:     Head: Normocephalic and atraumatic.  Eyes:     General: No scleral icterus. Cardiovascular:     Rate and Rhythm: Normal rate and regular rhythm.     Heart sounds: Normal heart sounds.  Pulmonary:     Effort: Pulmonary effort is normal. No respiratory distress.     Breath sounds: No wheezing.  Abdominal:     General: Bowel sounds are normal. There is no distension.     Palpations: Abdomen is  soft.  Musculoskeletal:        General: No deformity. Normal range of motion.     Cervical back: Normal range of motion and neck supple.  Skin:    General: Skin is warm and dry.     Findings: No erythema or rash.  Neurological:     Mental Status: She is alert and oriented to person, place, and time. Mental status is at baseline.     Cranial Nerves: No cranial nerve deficit.     Coordination: Coordination normal.  Psychiatric:        Mood and Affect: Mood normal.      LABORATORY DATA:  I have reviewed the data as listed    Latest Ref Rng & Units 10/15/2022   11:40 AM 08/20/2022   11:56 AM 04/19/2021    9:52 AM  CBC  WBC 4.0 - 10.5 K/uL 6.9  8.1  7.9   Hemoglobin 12.0 - 15.0 g/dL 14.0  13.9  14.3   Hematocrit 36.0 - 46.0 % 43.1  41.9  43.5   Platelets 150 - 400 K/uL 130  134  144       Latest Ref Rng & Units 08/20/2022   11:56 AM 07/17/2021    1:56 PM 04/19/2021    9:52 AM  CMP  Glucose 70 - 99 mg/dL 102  104  117   BUN 8 - 27 mg/dL '12  13  14   '$ Creatinine 0.57 - 1.00 mg/dL 0.71  0.72  0.81   Sodium 134 - 144 mmol/L 136  139  140   Potassium 3.5 - 5.2 mmol/L 3.6  4.1  4.0   Chloride 96 - 106 mmol/L 99  99  98   CO2 20 - 29 mmol/L '25  27  26   '$ Calcium 8.7 - 10.3 mg/dL 9.6  9.9  10.2   Total Protein 6.0 - 8.5 g/dL 6.6  6.6  6.7   Total Bilirubin 0.0 - 1.2 mg/dL 0.5  0.3  0.4   Alkaline Phos 44 - 121 IU/L 67  74  73  AST 0 - 40 IU/L '18  17  23   '$ ALT 0 - 32 IU/L '15  13  17      '$ RADIOGRAPHIC STUDIES: I have personally reviewed the radiological images as listed and agreed with the findings in the report.  No results found.

## 2022-11-14 NOTE — Assessment & Plan Note (Addendum)
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  Slightly increased light chain ratio, nonspecific. Recommend patient to take empiric vitamin B12 1000 mcg daily. Also check 24-hour urine protein electrophoresis.

## 2022-11-19 ENCOUNTER — Other Ambulatory Visit: Payer: Self-pay

## 2022-11-20 ENCOUNTER — Ambulatory Visit (INDEPENDENT_AMBULATORY_CARE_PROVIDER_SITE_OTHER): Payer: Medicare HMO | Admitting: Nurse Practitioner

## 2022-11-20 ENCOUNTER — Encounter: Payer: Self-pay | Admitting: Nurse Practitioner

## 2022-11-20 VITALS — BP 140/82 | HR 59 | Temp 98.3°F | Resp 16 | Ht 66.0 in | Wt 263.6 lb

## 2022-11-20 DIAGNOSIS — D696 Thrombocytopenia, unspecified: Secondary | ICD-10-CM | POA: Diagnosis not present

## 2022-11-20 DIAGNOSIS — G8929 Other chronic pain: Secondary | ICD-10-CM

## 2022-11-20 DIAGNOSIS — E1165 Type 2 diabetes mellitus with hyperglycemia: Secondary | ICD-10-CM | POA: Diagnosis not present

## 2022-11-20 DIAGNOSIS — M25561 Pain in right knee: Secondary | ICD-10-CM | POA: Diagnosis not present

## 2022-11-20 DIAGNOSIS — I10 Essential (primary) hypertension: Secondary | ICD-10-CM | POA: Diagnosis not present

## 2022-11-20 LAB — POCT GLYCOSYLATED HEMOGLOBIN (HGB A1C): Hemoglobin A1C: 6.6 % — AB (ref 4.0–5.6)

## 2022-11-20 NOTE — Progress Notes (Signed)
[facility] [place], [place] [address]  Internal MEDICINE  Office Visit Note  Patient Name: Jill Banks  [id number]  [id number]  Date of Service: 11/20/2022  Chief Complaint  Patient presents with   Diabetes   Hyperlipidemia   Hypertension    HPI Jill Banks presents for a follow-up visit for diabetes  Diabetes -- diet controlled, A1c is 6.6, no change from December.  Chronic right knee pain -- Knee surgery scheduled for April 26. Also takes naproxen as needed for knee pain.  Chronic thrombocytopenia -- followed by heme/onc, Dr. Tasia Catchings.  Hypertension -- controlled, takes amlodipine, bisoprolol and chlorthalidone.     Current Medication: Outpatient Encounter Medications as of 11/20/2022  Medication Sig   Accu-Chek Softclix Lancets lancets Use as instructed to check blood sugars once daily   amLODipine (NORVASC) 5 MG tablet Take 1 tablet (5 mg total) by mouth daily.   bisoprolol (ZEBETA) 10 MG tablet Take 1 tablet (10 mg total) by mouth daily.   Blood Glucose Monitoring Suppl (ONETOUCH VERIO FLEX SYSTEM) w/Device KIT    calcitRIOL (ROCALTROL) 0.25 MCG capsule Take 1 capsule (0.25 mcg total) by mouth daily.   chlorthalidone (HYGROTON) 25 MG tablet TAKE 1 TABLET BY MOUTH ONCE DAILY.   cyanocobalamin (VITAMIN B12) 1000 MCG tablet Take 1 tablet (1,000 mcg total) by mouth daily.   diclofenac sodium (VOLTAREN) 1 % GEL Apply 2 g topically 4 (four) times daily as needed (knee pain).   glucose blood test strip Use as instructed to check blood sugars once daily E11.65   naproxen sodium (ALEVE) 220 MG tablet Take 440 mg by mouth 2 (two) times daily as needed (pain).   NON FORMULARY cpap device   potassium chloride SA (KLOR-CON M) 20 MEQ tablet TAKE 1 TABLET BY MOUTH TWICE DAILY   No facility-administered encounter medications on file as of 11/20/2022.    Surgical History: Past Surgical History:  Procedure Laterality Date   APPLICATION OF WOUND VAC 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    Procedure: APPLICATION OF WOUND VAC;  Surgeon: Hessie Knows, MD;  Location: [facility] ORS;  Service: Orthopedics;  Laterality: Left;  [id number]   COLONOSCOPY WITH PROPOFOL N/A 09/25/2021   Procedure: COLONOSCOPY WITH PROPOFOL;  Surgeon: Lin Landsman, MD;  Location: [facility] ENDOSCOPY;  Service: Gastroenterology;  Laterality: N/A;   DILATATION & CURETTAGE/HYSTEROSCOPY WITH MYOSURE  10/17/2020   Procedure: DILATATION & CURETTAGE/HYSTEROSCOPY WITH MYOSURE POLYPECTOMY;  Surgeon: Homero Fellers, MD;  Location: [facility] ORS;  Service: Gynecology;;   DILATION AND CURETTAGE OF UTERUS     x 3   TOE SURGERY Left    second toe   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   Procedure: LEFT TOTAL KNEE ARTHROPLASTY;  Surgeon: Hessie Knows, MD;  Location: [facility] ORS;  Service: Orthopedics;  Laterality: Left;    Medical History: Past Medical History:  Diagnosis Date   Anxiety    Arthritis    Bruises easily    COVID-19 04/2019   Diabetes mellitus without complication (HCC)    Headache    HLD (hyperlipidemia)    Hypertension    Leg pain    Pneumonia    about 5 yrs ago   Sleep apnea     Family History: Family History  Problem Relation Age of Onset   Hypertension Father    Stroke Father    Hypertension Brother    Stroke Brother    Hyperlipidemia Brother    Breast cancer Maternal Aunt    Asthma Son  Social History   Socioeconomic History   Marital status: Married    Spouse name: Not on file   Number of children: Not on file   Years of education: Not on file   Highest education level: Not on file  Occupational History   Not on file  Tobacco Use   Smoking status: Former    Packs/day: 0.25    Years: 2.00    Total pack years: 0.50    Types: Cigarettes    Quit date: 2009    Years since quitting: 15.1   Smokeless tobacco: Never  Vaping Use   Vaping Use: Never used  Substance and Sexual Activity   Alcohol use: Not Currently    Comment: ocassionally   Drug use: No   Sexual  activity: Yes  Other Topics Concern   Not on file  Social History Narrative   Not on file   Social Determinants of Health   Financial Resource Strain: Not on file  Food Insecurity: No Food Insecurity (10/15/2022)   Hunger Vital Sign    Worried About Running Out of Food in the Last Year: Never true    Ran Out of Food in the Last Year: Never true  Transportation Needs: No Transportation Needs (10/15/2022)   PRAPARE - Hydrologist (Medical): No    Lack of Transportation (Non-Medical): No  Physical Activity: Not on file  Stress: Not on file  Social Connections: Not on file  Intimate Partner Violence: Not At Risk (10/15/2022)   Humiliation, Afraid, Rape, and Kick questionnaire    Fear of Current or Ex-Partner: No    Emotionally Abused: No    Physically Abused: No    Sexually Abused: No      Review of Systems  Constitutional:  Negative for chills, fatigue and unexpected weight change.  HENT:  Negative for congestion, rhinorrhea, sneezing and sore throat.   Eyes:  Negative for redness.  Respiratory: Negative.  Negative for cough, chest tightness, shortness of breath and wheezing.   Cardiovascular: Negative.  Negative for chest pain and palpitations.  Gastrointestinal:  Negative for abdominal pain, constipation, diarrhea, nausea and vomiting.  Genitourinary:  Negative for dysuria and frequency.  Musculoskeletal:  Negative for arthralgias, back pain, joint swelling and neck pain.  Skin:  Negative for rash.  Neurological: Negative.  Negative for tremors and numbness.  Hematological:  Negative for adenopathy. Does not bruise/bleed easily.  Psychiatric/Behavioral:  Negative for behavioral problems (Depression), sleep disturbance and suicidal ideas. The patient is not nervous/anxious.     Vital Signs: BP (!) 140/82   Pulse (!) 59   Temp 98.3 F (36.8 C)   Resp 16   Ht 5\' 6"  (1.676 m)   Wt 263 lb 9.6 oz (119.6 kg)   BMI 42.55 kg/m    Physical  Exam Vitals reviewed.  Constitutional:      General: She is not in acute distress.    Appearance: Normal appearance. She is obese. She is not ill-appearing.  HENT:     Head: Normocephalic and atraumatic.  Eyes:     Pupils: Pupils are equal, round, and reactive to light.  Cardiovascular:     Rate and Rhythm: Normal rate and regular rhythm.  Pulmonary:     Effort: Pulmonary effort is normal. No respiratory distress.  Neurological:     Mental Status: She is alert and oriented to person, place, and time.  Psychiatric:        Mood and Affect: Mood normal.  Behavior: Behavior normal.        Assessment/Plan: 1. Type 2 diabetes mellitus with hyperglycemia, without long-term current use of insulin (HCC) Continue diet and lifestyle modifications as discussed. Additional information provided via handouts regarding nutrition and diabetes self care. Follow up in 6 months to repeat A1c, if still no change or A1c is increased, will discuss adding medication possibly.  - POCT glycosylated hemoglobin (Hb A1C)  2. Essential (primary) hypertension Stable, continue amlodipine, bisoprolol and chlorthalidone as prescribed.   3. Thrombocytopenia ([place]) Followed by Dr. Tasia Catchings, please continue to follow their instructions   4. Chronic pain of right knee Scheduled for knee surgery on April 26. Continue to take OTC naproxen as needed    General Counseling: sharman dupuy understanding of the findings of todays visit and agrees with plan of treatment. I have discussed any further diagnostic evaluation that may be needed or ordered today. We also reviewed her medications today. she has been encouraged to call the office with any questions or concerns that should arise related to todays visit.    Orders Placed This Encounter  Procedures   POCT glycosylated hemoglobin (Hb A1C)    No orders of the defined types were placed in this encounter.   Return in about 6 months (around 05/23/2023) for F/U,  Recheck A1C, Avelino Herren PCP.   Total time spent:30 Minutes Time spent includes review of chart, medications, test results, and follow up plan with the patient.   [place] Controlled Substance Database was reviewed by me.  This patient was seen by Jonetta Osgood, FNP-C in collaboration with Dr. Clayborn Bigness as a part of collaborative care agreement.   Iretta Mangrum R. Valetta Fuller, MSN, FNP-C Internal medicine

## 2022-11-21 ENCOUNTER — Other Ambulatory Visit: Payer: Self-pay

## 2022-11-21 ENCOUNTER — Inpatient Hospital Stay: Payer: Medicare HMO | Attending: Oncology

## 2022-11-21 DIAGNOSIS — D696 Thrombocytopenia, unspecified: Secondary | ICD-10-CM | POA: Diagnosis not present

## 2022-11-25 LAB — IFE+PROTEIN ELECTRO, 24-HR UR
% BETA, Urine: 29.2 %
ALPHA 1 URINE: 2 %
Albumin, U: 48.2 %
Alpha 2, Urine: 9.9 %
GAMMA GLOBULIN URINE: 10.7 %
Total Protein, Urine-Ur/day: 233 mg/24 hr — ABNORMAL HIGH (ref 30–150)
Total Protein, Urine: 11.1 mg/dL
Total Volume: 2100

## 2022-11-27 ENCOUNTER — Telehealth: Payer: Self-pay | Admitting: *Deleted

## 2022-11-27 NOTE — Telephone Encounter (Signed)
Patient called asking for return call to go over her lab results. She also reports that she is scheduled for knee surgery 01/10/23 and wants to be sure that is alright with her low platelets

## 2023-01-20 ENCOUNTER — Other Ambulatory Visit: Payer: Self-pay | Admitting: Nurse Practitioner

## 2023-01-20 DIAGNOSIS — E876 Hypokalemia: Secondary | ICD-10-CM

## 2023-01-23 ENCOUNTER — Encounter: Payer: Medicare HMO | Admitting: Orthopaedic Surgery

## 2023-01-27 ENCOUNTER — Other Ambulatory Visit: Payer: Self-pay | Admitting: Nurse Practitioner

## 2023-01-27 DIAGNOSIS — I10 Essential (primary) hypertension: Secondary | ICD-10-CM

## 2023-02-04 NOTE — Patient Instructions (Signed)
SURGICAL WAITING ROOM VISITATION Patients having surgery or a procedure may have no more than 2 support people in the waiting area - these visitors may rotate in the visitor waiting room.   Due to an increase in RSV and influenza rates and associated hospitalizations, children ages 54 and under may not visit patients in [facility] hospitals. If the patient needs to stay at the hospital during part of their recovery, the visitor guidelines for inpatient rooms apply.  PRE-OP VISITATION  Pre-op nurse will coordinate an appropriate time for 1 support person to accompany the patient in pre-op.  This support person may not rotate.  This visitor will be contacted when the time is appropriate for the visitor to come back in the pre-op area.  Please refer to the [facility] website for the visitor guidelines for Inpatients (after your surgery is over and you are in a regular room).  You are not required to quarantine at this time prior to your surgery. However, you must do this: Hand Hygiene often Do NOT share personal items Notify your provider if you are in close contact with someone who has COVID or you develop fever 100.4 or greater, new onset of sneezing, cough, sore throat, shortness of breath or body aches.  If you test positive for Covid or have been in contact with anyone that has tested positive in the last 10 days please notify you surgeon.    Your procedure is scheduled on:  Friday  Feb 14, 2023  Report to [facility] Main Entrance: Le[address] [facility]sen entrance where the [employer] is available.   Report to admitting at:  09:15   AM  +++++Call this number if you have any questions or problems the morning of surgery [phone]  Do not eat food after Midnight the night prior to your surgery/procedure.  After Midnight you may have the following liquids until  08:45 AM  DAY OF SURGERY  Clear Liquid Diet Water Black Coffee (sugar ok, NO MILK/CREAM OR CREAMERS)  Tea (sugar ok, NO  MILK/CREAM OR CREAMERS) regular and decaf                             Plain Jell-O  with no fruit (NO RED)                                           Fruit ices (not with fruit pulp, NO RED)                                     Popsicles (NO RED)                                                                  Juice: apple, WHITE grape, WHITE cranberry Sports drinks like Gatorade or Powerade (NO RED)                    The day of surgery:  Drink ONE (1) Pre-Surgery G2 at 08:45 AM the morning of surgery. Drink in one sitting. Do  not sip.  This drink was given to you during your hospital pre-op appointment visit. Nothing else to drink after completing the Pre-Surgery G2 : No candy, chewing gum or throat lozenges.    FOLLOW  ANY ADDITIONAL PRE OP INSTRUCTIONS YOU RECEIVED FROM YOUR SURGEON'S OFFICE!!!   Oral Hygiene is also important to reduce your risk of infection.        Remember - BRUSH YOUR TEETH THE MORNING OF SURGERY WITH YOUR REGULAR TOOTHPASTE  Do NOT smoke after Midnight the night before surgery.  Take ONLY these medicines the morning of surgery with A SIP OF WATER: Amlodipine, Bisoprolol (Zybeta)   You may not have any metal on your body including hair pins, jewelry, and body piercing  Do not wear make-up, lotions, powders, perfumes or deodorant  Do not wear nail polish including gel and S&S, artificial / acrylic nails, or any other type of covering on natural nails including finger and toenails. If you have artificial nails, gel coating, etc., that needs to be removed by a nail salon, Please have this removed prior to surgery. Not doing so may mean that your surgery could be cancelled or delayed if the Surgeon or anesthesia staff feels like they are unable to monitor you safely.   Do not shave 48 hours prior to surgery to avoid nicks in your skin which may contribute to postoperative infections.    Contacts, Hearing Aids, dentures or bridgework may not be worn into surgery.  DENTURES WILL BE REMOVED PRIOR TO SURGERY PLEASE DO NOT APPLY "Poly grip" OR ADHESIVES!!!  You may bring a small overnight bag with you on the day of surgery, only pack items that are not valuable. [facility] IS NOT RESPONSIBLE   FOR VALUABLES THAT ARE LOST OR STOLEN.   Do not bring your home medications to the hospital. The Pharmacy will dispense medications listed on your medication list to you during your admission in the Hospital.  Special Instructions: Bring a copy of your healthcare power of attorney and living will documents the day of surgery, if you wish to have them scanned into your [facility] Records- EPIC  Please read over the following fact sheets you were given: IF YOU HAVE QUESTIONS ABOUT YOUR PRE-OP INSTRUCTIONS, PLEASE CALL [phone].      Pre-operative 5 CHG Bath Instructions   You can play a key role in reducing the risk of infection after surgery. Your skin needs to be as free of germs as possible. You can reduce the number of germs on your skin by washing with CHG (chlorhexidine gluconate) soap before surgery. CHG is an antiseptic soap that kills germs and continues to kill germs even after washing.   DO NOT use if you have an allergy to chlorhexidine/CHG or antibacterial soaps. If your skin becomes reddened or irritated, stop using the CHG and notify one of our RNs at [phone]  Please shower with the CHG soap starting 4 days before surgery using the following schedule: START SHOWERS ON [facility] 02-10-2023  Please keep in mind the following:  DO NOT shave, including legs and underarms, starting the day of your first shower.   You may shave your face at any point before/day of surgery.   Place clean sheets on your bed the day you start using CHG soap. Use a clean washcloth  (not used since being washed) for each shower. DO NOT sleep with pets once you start using the CHG.   CHG Shower Instructions:  If you choose to wash your hair and private area, wash first with your normal shampoo/soap.  After you use shampoo/soap, rinse your hair and body thoroughly to remove shampoo/soap residue.  Turn the water OFF and apply about 3 tablespoons (45 ml) of CHG soap to a CLEAN washcloth.  Apply CHG soap ONLY FROM YOUR NECK DOWN TO YOUR TOES (washing for 3-5 minutes)  DO NOT use CHG soap on face, private areas, open wounds, or sores.  Pay special attention to the area where your surgery is being performed.  If you are having back surgery, having someone wash your back for you may be helpful.  Wait 2 minutes after CHG soap is applied, then you may rinse off the CHG soap.  Pat dry with a clean towel  Put on clean clothes/pajamas   If you choose to wear lotion, please use ONLY the CHG-compatible lotions on the back of this paper.     Additional instructions for the day of surgery: DO NOT APPLY any lotions, deodorants, cologne, or perfumes.   Put on clean/comfortable clothes.  Brush your teeth.  Ask your nurse before applying any prescription medications to the skin.      CHG Compatible Lotions   Aveeno Moisturizing lotion  Cetaphil Moisturizing Cream  Cetaphil Moisturizing Lotion  Clairol Herbal Essence Moisturizing Lotion, Dry Skin  Clairol Herbal Essence Moisturizing Lotion, Extra Dry Skin  Clairol Herbal Essence Moisturizing Lotion, Normal Skin  Curel Age Defying Therapeutic Moisturizing Lotion with Alpha Hydroxy  Curel Extreme Care Body Lotion  Curel Soothing Hands Moisturizing Hand Lotion  Curel Therapeutic Moisturizing Cream, Fragrance-Free  Curel Therapeutic Moisturizing Lotion, Fragrance-Free  Curel Therapeutic Moisturizing Lotion, Original Formula  Eucerin Daily Replenishing Lotion  Eucerin Dry Skin Therapy Plus Alpha Hydroxy Crme  Eucerin Dry Skin  Therapy Plus Alpha Hydroxy Lotion  Eucerin Original Crme  Eucerin Original Lotion  Eucerin Plus Crme Eucerin Plus Lotion  Eucerin TriLipid Replenishing Lotion  Keri Anti-Bacterial Hand Lotion  Keri Deep Conditioning Original Lotion Dry Skin Formula Softly Scented  Keri Deep Conditioning Original Lotion, Fragrance Free Sensitive Skin Formula  Keri Lotion Fast Absorbing Fragrance Free Sensitive Skin Formula  Keri Lotion Fast Absorbing Softly Scented Dry Skin Formula  Keri Original Lotion  Keri Skin Renewal Lotion Keri Silky Smooth Lotion  Keri Silky Smooth Sensitive Skin Lotion  Nivea Body Creamy Conditioning Oil  Nivea Body Extra Enriched Lotion  Nivea Body Original Lotion  Nivea Body Sheer Moisturizing Lotion Nivea Crme  Nivea Skin Firming Lotion  NutraDerm 30 Skin Lotion  NutraDerm Skin Lotion  NutraDerm Therapeutic Skin Cream  NutraDerm Therapeutic Skin Lotion  ProShield Protective Hand Cream  Provon moisturizing lotion ON THE DAY OF SURGERY : Do not apply any lotions/deodorants the morning of surgery.  Please wear clean clothes to the hospital/surgery center.    FAILURE TO FOLLOW THESE INSTRUCTIONS MAY RESULT IN THE CANCELLATION OF YOUR SURGERY  PATIENT SIGNATURE_________________________________  NURSE SIGNATURE__________________________________  ________________________________________________________________________        Rogelia Mire    An incentive spirometer  is a tool that can help keep your lungs clear and active. This tool measures how well you are filling your lungs with each breath. Taking long deep breaths may help reverse or decrease the chance of developing breathing (pulmonary) problems (especially infection) following: A long period of time when you are unable to move or be active. BEFORE THE PROCEDURE  If the spirometer includes an indicator to show your best effort, your nurse or respiratory therapist will set it to a desired goal. If  possible, sit up straight or lean slightly forward. Try not to slouch. Hold the incentive spirometer in an upright position. INSTRUCTIONS FOR USE  Sit on the edge of your bed if possible, or sit up as far as you can in bed or on a chair. Hold the incentive spirometer in an upright position. Breathe out normally. Place the mouthpiece in your mouth and seal your lips tightly around it. Breathe in slowly and as deeply as possible, raising the piston or the ball toward the top of the column. Hold your breath for 3-5 seconds or for as long as possible. Allow the piston or ball to fall to the bottom of the column. Remove the mouthpiece from your mouth and breathe out normally. Rest for a few seconds and repeat Steps 1 through 7 at least 10 times every 1-2 hours when you are awake. Take your time and take a few normal breaths between deep breaths. The spirometer may include an indicator to show your best effort. Use the indicator as a goal to work toward during each repetition. After each set of 10 deep breaths, practice coughing to be sure your lungs are clear. If you have an incision (the cut made at the time of surgery), support your incision when coughing by placing a pillow or rolled up towels firmly against it. Once you are able to get out of bed, walk around indoors and cough well. You may stop using the incentive spirometer when instructed by your caregiver.  RISKS AND COMPLICATIONS Take your time so you do not get dizzy or light-headed. If you are in pain, you may need to take or ask for pain medication before doing incentive spirometry. It is harder to take a deep breath if you are having pain. AFTER USE Rest and breathe slowly and easily. It can be helpful to keep track of a log of your progress. Your caregiver can provide you with a simple table to help with this. If you are using the spirometer at home, follow these instructions: SEEK MEDICAL CARE IF:  You are having difficultly using the  spirometer. You have trouble using the spirometer as often as instructed. Your pain medication is not giving enough relief while using the spirometer. You develop fever of 100.5 F (38.1 C) or higher.                                                                                                    SEEK IMMEDIATE MEDICAL CARE IF:  You cough up bloody sputum that had not been present before. You develop fever of 102  F (38.9 C) or greater. You develop worsening pain at or near the incision site. MAKE SURE YOU:  Understand these instructions. Will watch your condition. Will get help right away if you are not doing well or get worse. Document Released: 01/13/2007 Document Revised: 11/25/2011 Document Reviewed: 03/16/2007 [facility] Patient Information 2014 [place], [place].    WHAT IS A BLOOD TRANSFUSION? Blood Transfusion Information  A transfusion is the replacement of blood or some of its parts. Blood is made up of multiple cells which provide different functions. Red blood cells carry oxygen and are used for blood loss replacement. White blood cells fight against infection. Platelets control bleeding. Plasma helps clot blood. Other blood products are available for specialized needs, such as hemophilia or other clotting disorders. BEFORE THE TRANSFUSION  Who gives blood for transfusions?  Healthy volunteers who are fully evaluated to make sure their blood is safe. This is blood bank blood. Transfusion therapy is the safest it has ever been in the practice of medicine. Before blood is taken from a donor, a complete history is taken to make sure that person has no history of diseases nor engages in risky social behavior (examples are intravenous drug use or sexual activity with multiple partners). The donor's travel history is screened to minimize risk of transmitting infections, such as malaria. The donated blood is tested for signs of infectious diseases, such as HIV and hepatitis. The  blood is then tested to be sure it is compatible with you in order to minimize the chance of a transfusion reaction. If you or a relative donates blood, this is often done in anticipation of surgery and is not appropriate for emergency situations. It takes many days to process the donated blood. RISKS AND COMPLICATIONS Although transfusion therapy is very safe and saves many lives, the main dangers of transfusion include:  Getting an infectious disease. Developing a transfusion reaction. This is an allergic reaction to something in the blood you were given. Every precaution is taken to prevent this. The decision to have a blood transfusion has been considered carefully by your caregiver before blood is given. Blood is not given unless the benefits outweigh the risks. AFTER THE TRANSFUSION Right after receiving a blood transfusion, you will usually feel much better and more energetic. This is especially true if your red blood cells have gotten low (anemic). The transfusion raises the level of the red blood cells which carry oxygen, and this usually causes an energy increase. The nurse administering the transfusion will monitor you carefully for complications. HOME CARE INSTRUCTIONS  No special instructions are needed after a transfusion. You may find your energy is better. Speak with your caregiver about any limitations on activity for underlying diseases you may have. SEEK MEDICAL CARE IF:  Your condition is not improving after your transfusion. You develop redness or irritation at the intravenous (IV) site. SEEK IMMEDIATE MEDICAL CARE IF:  Any of the following symptoms occur over the next 12 hours: Shaking chills. You have a temperature by mouth above 102 F (38.9 C), not controlled by medicine. Chest, back, or muscle pain. People around you feel you are not acting correctly or are confused. Shortness of breath or difficulty breathing. Dizziness and fainting. You get a rash or develop  hives. You have a decrease in urine output. Your urine turns a dark color or changes to pink, red, or brown. Any of the following symptoms occur over the next 10 days: You have a temperature by mouth above 102 F (38.9  C), not controlled by medicine. Shortness of breath. Weakness after normal activity. The white part of the eye turns yellow (jaundice). You have a decrease in the amount of urine or are urinating less often. Your urine turns a dark color or changes to pink, red, or brown. Document Released: 08/30/2000 Document Revised: 11/25/2011 Document Reviewed: 04/18/2008 [facility] Patient Information 2014 [place], [place].  _______________________________________________________________________

## 2023-02-04 NOTE — Progress Notes (Signed)
COVID Vaccine received:  []  No [x]  Yes Date of any COVID positive Test in last 90 days:  PCP - Sallyanne Kuster, NP at [facility]  Cardiologist -  Hematology- Dr. Rickard Patience Pulmonology/ Sleep Medicine  Freda Munro MD  Chest x-ray -   EKG -  (10-18-2020  Epic) will repeat at PST Stress Test -  ECHO - 05-10-2021  Epic Cardiac Cath -   PCR screen: [x]  Ordered & Completed           []   No Order but Needs PROFEND           []   N/A for this surgery  Surgery Plan:  []  Ambulatory                            [x]  Outpatient in bed                            []  Admit  Anesthesia:    []  General  [x]  Spinal                           []   Choice []   MAC  Pacemaker / ICD device [x]  No []  Yes   Spinal Cord Stimulator:[x]  No []  Yes       History of Sleep Apnea? []  No [x]  Yes   CPAP used?- [x]  No []  Yes  can't tolerate  Does the patient monitor blood sugar?          []  No [x]  Yes  []  N/A Last A1c: 11-20-2022 ? 6.6    No meds just Diet  Patient has: []  NO Hx DM   [x]  Pre-DM                 []  DM1  []   DM2 Does patient have a [employer] or Dexacom? []  No []  Yes   Fasting Blood Sugar Ranges-  Checks Blood Sugar _____ times a day  Blood Thinner / Instructions:  None Aspirin Instructions:  None  ERAS Protocol Ordered: []  No  [x]  Yes PRE-SURGERY []  ENSURE  [x]  G2  Patient is to be NPO after: 08:45 am  Comments: Patient was given the 5 CHG shower / bath instructions for THA / TKA / Total or Reverse Shoulder arthroplasty surgery along with 2 bottles of the CHG soap. Patient will start this on: Monday 02-10-2023. All questions were asked and answered, Patient voiced understanding of this process.   Activity level: Patient is able / unable to climb a flight of stairs without difficulty; []  No CP  []  No SOB, but would have ___   Patient can / can not perform ADLs without assistance.   Anesthesia review: HTN, OSA- No CPAP, thrombocytopenia ( on 10-15-2022 ? 130), Pre-DM (Diet only),  anemia, anxiety  Patient denies shortness of breath, fever, cough and chest pain at PAT appointment.  Patient verbalized understanding and agreement to the Pre-Surgical Instructions that were given to them at this PAT appointment. Patient was also educated of the need to review these PAT instructions again prior to her surgery.I reviewed the appropriate phone numbers to call if they have any and questions or concerns.

## 2023-02-05 ENCOUNTER — Encounter (HOSPITAL_COMMUNITY): Payer: Self-pay

## 2023-02-05 ENCOUNTER — Other Ambulatory Visit: Payer: Self-pay

## 2023-02-05 ENCOUNTER — Encounter (HOSPITAL_COMMUNITY)
Admission: RE | Admit: 2023-02-05 | Discharge: 2023-02-05 | Disposition: A | Discharge status: Home / Self Care | Payer: Medicare HMO | Source: Ambulatory Visit | Attending: Orthopaedic Surgery | Admitting: Orthopaedic Surgery

## 2023-02-05 VITALS — BP 155/83 | HR 72 | Temp 98.9°F | Resp 20 | Ht 66.0 in | Wt 259.0 lb

## 2023-02-05 DIAGNOSIS — R7303 Prediabetes: Secondary | ICD-10-CM | POA: Insufficient documentation

## 2023-02-05 DIAGNOSIS — I10 Essential (primary) hypertension: Secondary | ICD-10-CM | POA: Diagnosis not present

## 2023-02-05 DIAGNOSIS — M1711 Unilateral primary osteoarthritis, right knee: Secondary | ICD-10-CM | POA: Diagnosis not present

## 2023-02-05 DIAGNOSIS — Z01818 Encounter for other preprocedural examination: Secondary | ICD-10-CM | POA: Diagnosis present

## 2023-02-05 HISTORY — DX: Disease of blood and blood-forming organs, unspecified: D75.9

## 2023-02-05 LAB — CBC
HCT: 44.3 % (ref 36.0–46.0)
Hemoglobin: 14.1 g/dL (ref 12.0–15.0)
MCH: 27.2 pg (ref 26.0–34.0)
MCHC: 31.8 g/dL (ref 30.0–36.0)
MCV: 85.5 fL (ref 80.0–100.0)
Platelets: 133 10*3/uL — ABNORMAL LOW (ref 150–400)
RBC: 5.18 MIL/uL — ABNORMAL HIGH (ref 3.87–5.11)
RDW: 15.9 % — ABNORMAL HIGH (ref 11.5–15.5)
WBC: 8.6 10*3/uL (ref 4.0–10.5)
nRBC: 0 % (ref 0.0–0.2)

## 2023-02-05 LAB — COMPREHENSIVE METABOLIC PANEL
ALT: 15 U/L (ref 0–44)
AST: 19 U/L (ref 15–41)
Albumin: 3.8 g/dL (ref 3.5–5.0)
Alkaline Phosphatase: 49 U/L (ref 38–126)
Anion gap: 9 (ref 5–15)
BUN: 18 mg/dL (ref 8–23)
CO2: 28 mmol/L (ref 22–32)
Calcium: 9.5 mg/dL (ref 8.9–10.3)
Chloride: 101 mmol/L (ref 98–111)
Creatinine, Ser: 0.74 mg/dL (ref 0.44–1.00)
GFR, Estimated: >60 mL/min (ref >60)
Glucose, Bld: 115 mg/dL — ABNORMAL HIGH (ref 70–99)
Potassium: 3.3 mmol/L — ABNORMAL LOW (ref 3.5–5.1)
Sodium: 138 mmol/L (ref 135–145)
Total Bilirubin: 0.7 mg/dL (ref 0.3–1.2)
Total Protein: 7.4 g/dL (ref 6.5–8.1)

## 2023-02-05 LAB — SURGICAL PCR SCREEN
MRSA, PCR: NEGATIVE
Staphylococcus aureus: NEGATIVE

## 2023-02-05 LAB — HEMOGLOBIN A1C
Hgb A1c MFr Bld: 6.4 % — ABNORMAL HIGH (ref 4.8–5.6)
Mean Plasma Glucose: 136.98 mg/dL

## 2023-02-05 LAB — GLUCOSE, CAPILLARY: Glucose-Capillary: 136 mg/dL — ABNORMAL HIGH (ref 70–99)

## 2023-02-13 NOTE — H&P (Signed)
TOTAL KNEE ADMISSION H&P  Patient is being admitted for right total knee arthroplasty.  Subjective:  Chief Complaint:right knee pain.  HPI: Jill Banks, 67 y.o. female, has a history of pain and functional disability in the right knee due to arthritis and has failed non-surgical conservative treatments for greater than 12 weeks to includeNSAID's and/or analgesics, corticosteriod injections, flexibility and strengthening excercises, use of assistive devices, weight reduction as appropriate, and activity modification.  Onset of symptoms was gradual, starting 3 years ago with gradually worsening course since that time. The patient noted no past surgery on the right knee(s).  Patient currently rates pain in the right knee(s) at 10 out of 10 with activity. Patient has night pain, worsening of pain with activity and weight bearing, pain that interferes with activities of daily living, pain with passive range of motion, crepitus, and joint swelling.  Patient has evidence of subchondral sclerosis, periarticular osteophytes, and joint space narrowing by imaging studies. There is no active infection.  Patient Active Problem List   Diagnosis Date Noted   Unilateral primary osteoarthritis, right knee 10/21/2022   Thrombocytopenia (HCC) 10/15/2022   Endometrial polyp    Postmenopausal bleeding    Uterine enlargement 09/17/2020   BMI 40.0-44.9, adult (HCC) 09/17/2020   Uterine leiomyoma 08/26/2020   Aortic atherosclerosis (HCC) 08/26/2020   Diastasis of rectus abdominis 08/08/2020   Encounter for health maintenance examination with abnormal findings 07/05/2020   Bone spur of right ankle 07/05/2020   S/P TKR (total knee replacement) using cement, left 07/27/2019   Encounter for general adult medical examination with abnormal findings 07/01/2019   Adjustment insomnia 07/01/2019   Encounter for screening mammogram for malignant neoplasm of breast 07/01/2019   Routine cervical smear 07/01/2019   Ventral  hernia without obstruction or gangrene 07/01/2019   Pain and swelling of ankle, right 01/13/2019   Acute vaginitis 01/11/2018   Bacterial vaginitis 12/18/2017   Inflammatory polyarthritis (HCC) 12/18/2017   Impaired fasting glucose 09/23/2017   Obesity, unspecified 09/23/2017   Hypokalemia 09/23/2017   Melanocytic nevi, unspecified 09/23/2017   Pain in right knee 09/23/2017   Dysuria 09/23/2017   Pain in right ankle and joints of right foot 09/23/2017   Essential hypertension 09/23/2017   Other fatigue 09/23/2017   Vitamin D deficiency, unspecified 09/23/2017   Iron deficiency anemia, unspecified 09/23/2017   OSA on CPAP 09/23/2017   Past Medical History:  Diagnosis Date   Anxiety    Arthritis    Blood dyscrasia    Thrombocytopenia   Bruises easily    COVID-19 04/2019   Diabetes mellitus without complication (HCC)    Headache    HLD (hyperlipidemia)    Hypertension    Leg pain    Pneumonia    about 5 yrs ago   Sleep apnea    no CPAP    Past Surgical History:  Procedure Laterality Date   APPLICATION OF WOUND VAC 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   Procedure: APPLICATION OF WOUND VAC;  Surgeon: Kennedy Bucker, MD;  Location: [facility] ORS;  Service: Orthopedics;  Laterality: Left;  #[id number]   COLONOSCOPY WITH PROPOFOL N/A 09/25/2021   Procedure: COLONOSCOPY WITH PROPOFOL;  Surgeon: Toney Reil, MD;  Location: [facility];  Service: Gastroenterology;  Laterality: N/A;   DILATATION & CURETTAGE/HYSTEROSCOPY WITH MYOSURE  10/17/2020   Procedure: DILATATION & CURETTAGE/HYSTEROSCOPY WITH MYOSURE POLYPECTOMY;  Surgeon: Natale Milch, MD;  Location: [facility] ORS;  Service: Gynecology;;   DILATION AND CURETTAGE OF UTERUS     x 3  TOE SURGERY Left    second toe   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   Procedure: LEFT TOTAL KNEE ARTHROPLASTY;  Surgeon: Kennedy Bucker, MD;  Location: [facility] ORS;  Service: Orthopedics;  Laterality: Left;    No current facility-administered medications for  this encounter.   Current Outpatient Medications  Medication Sig Dispense Refill Last Dose   amLODipine (NORVASC) 5 MG tablet Take 1 tablet (5 mg total) by mouth daily. 30 tablet 2    bisoprolol (ZEBETA) 10 MG tablet TAKE ONE TABLET BY MOUTH ONCE DAILY 30 tablet 2    calcitRIOL (ROCALTROL) 0.25 MCG capsule Take 1 capsule (0.25 mcg total) by mouth daily. 90 capsule 5    chlorthalidone (HYGROTON) 25 MG tablet TAKE 1 TABLET BY MOUTH ONCE DAILY. 90 tablet 1    cyanocobalamin (VITAMIN B12) 1000 MCG tablet Take 1 tablet (1,000 mcg total) by mouth daily. 90 tablet 1    diclofenac sodium (VOLTAREN) 1 % GEL Apply 2 g topically 4 (four) times daily as needed (knee pain).      naproxen sodium (ALEVE) 220 MG tablet Take 440 mg by mouth 2 (two) times daily as needed (pain).      potassium chloride SA (KLOR-CON M) 20 MEQ tablet TAKE 1 TABLET BY MOUTH TWICE DAILY 180 tablet 11    Accu-Chek Softclix Lancets lancets Use as instructed to check blood sugars once daily 300 each 3    Blood Glucose Monitoring Suppl (ONETOUCH VERIO FLEX SYSTEM) w/Device KIT       glucose blood test strip Use as instructed to check blood sugars once daily E11.65 300 each 3    NON FORMULARY cpap device      No Known Allergies  Social History   Tobacco Use   Smoking status: Former    Packs/day: 0.25    Years: 2.00    Additional pack years: 0.00    Total pack years: 0.50    Types: Cigarettes    Quit date: 2009    Years since quitting: 15.4   Smokeless tobacco: Never  Substance Use Topics   Alcohol use: Not Currently    Comment: ocassionally    Family History  Problem Relation Age of Onset   Hypertension Father    Stroke Father    Hypertension Brother    Stroke Brother    Hyperlipidemia Brother    Breast cancer Maternal Aunt    Asthma Son      Review of Systems  Objective:  Physical Exam Vitals reviewed.  Constitutional:      Appearance: Normal appearance. She is obese.  HENT:     Head: Normocephalic and  atraumatic.  Eyes:     Extraocular Movements: Extraocular movements intact.     Pupils: Pupils are equal, round, and reactive to light.  Cardiovascular:     Rate and Rhythm: Normal rate and regular rhythm.     Pulses: Normal pulses.  Pulmonary:     Effort: Pulmonary effort is normal.     Breath sounds: Normal breath sounds.  Abdominal:     Palpations: Abdomen is soft.  Musculoskeletal:     Cervical back: Normal range of motion and neck supple.     Right knee: Effusion, bony tenderness and crepitus present. Decreased range of motion. Tenderness present over the medial joint line, lateral joint line and patellar tendon. Abnormal alignment.  Neurological:     Mental Status: She is alert and oriented to person, place, and time.  Psychiatric:  Behavior: Behavior normal.     Vital signs in last 24 hours:    Labs:   Estimated body mass index is 41.8 kg/m as calculated from the following:   Height as of 02/05/23: 5\' 6"  (1.676 m).   Weight as of 02/05/23: 117.5 kg.   Imaging Review Plain radiographs demonstrate severe degenerative joint disease of the right knee(s). The overall alignment ismild varus. The bone quality appears to be excellent for age and reported activity level.      Assessment/Plan:  End stage arthritis,right knee   The patient history, physical examination, clinical judgment of the provider and imaging studies are consistent with end stage degenerative joint disease of the right knee(s) and total knee arthroplasty is deemed medically necessary. The treatment options including medical management, injection therapy arthroscopy and arthroplasty were discussed at length. The risks and benefits of total knee arthroplasty were presented and reviewed. The risks due to aseptic loosening, infection, stiffness, patella tracking problems, thromboembolic complications and other imponderables were discussed. The patient acknowledged the explanation, agreed to proceed with  the plan and consent was signed. Patient is being admitted for inpatient treatment for surgery, pain control, PT, OT, prophylactic antibiotics, VTE prophylaxis, progressive ambulation and ADL's and discharge planning. The patient is planning to be discharged home with home health services

## 2023-02-14 ENCOUNTER — Other Ambulatory Visit: Payer: Self-pay

## 2023-02-14 ENCOUNTER — Ambulatory Visit (HOSPITAL_COMMUNITY): Payer: Medicare HMO | Admitting: Certified Registered Nurse Anesthetist

## 2023-02-14 ENCOUNTER — Inpatient Hospital Stay (HOSPITAL_COMMUNITY)
Admission: AD | Admit: 2023-02-14 | Discharge: 2023-02-16 | DRG: 470 | Disposition: A | Discharge status: Home Health | Payer: Medicare HMO | Attending: Orthopaedic Surgery | Admitting: Orthopaedic Surgery

## 2023-02-14 ENCOUNTER — Encounter (HOSPITAL_COMMUNITY): Payer: Self-pay | Admitting: Orthopaedic Surgery

## 2023-02-14 ENCOUNTER — Observation Stay (HOSPITAL_COMMUNITY): Payer: Medicare HMO

## 2023-02-14 ENCOUNTER — Encounter (HOSPITAL_COMMUNITY)
Admission: AD | Disposition: A | Discharge status: Home Health | Payer: Self-pay | Source: Home / Self Care | Attending: Orthopaedic Surgery

## 2023-02-14 DIAGNOSIS — G4733 Obstructive sleep apnea (adult) (pediatric): Secondary | ICD-10-CM | POA: Diagnosis present

## 2023-02-14 DIAGNOSIS — Z87891 Personal history of nicotine dependence: Secondary | ICD-10-CM

## 2023-02-14 DIAGNOSIS — Z823 Family history of stroke: Secondary | ICD-10-CM

## 2023-02-14 DIAGNOSIS — E785 Hyperlipidemia, unspecified: Secondary | ICD-10-CM | POA: Diagnosis present

## 2023-02-14 DIAGNOSIS — Z96651 Presence of right artificial knee joint: Secondary | ICD-10-CM

## 2023-02-14 DIAGNOSIS — M1711 Unilateral primary osteoarthritis, right knee: Secondary | ICD-10-CM | POA: Diagnosis not present

## 2023-02-14 DIAGNOSIS — E669 Obesity, unspecified: Secondary | ICD-10-CM | POA: Diagnosis present

## 2023-02-14 DIAGNOSIS — Z83438 Family history of other disorder of lipoprotein metabolism and other lipidemia: Secondary | ICD-10-CM

## 2023-02-14 DIAGNOSIS — M25761 Osteophyte, right knee: Secondary | ICD-10-CM | POA: Diagnosis present

## 2023-02-14 DIAGNOSIS — Z825 Family history of asthma and other chronic lower respiratory diseases: Secondary | ICD-10-CM

## 2023-02-14 DIAGNOSIS — I10 Essential (primary) hypertension: Secondary | ICD-10-CM

## 2023-02-14 DIAGNOSIS — Z803 Family history of malignant neoplasm of breast: Secondary | ICD-10-CM

## 2023-02-14 DIAGNOSIS — Z8249 Family history of ischemic heart disease and other diseases of the circulatory system: Secondary | ICD-10-CM

## 2023-02-14 DIAGNOSIS — Z6841 Body Mass Index (BMI) 40.0 and over, adult: Secondary | ICD-10-CM

## 2023-02-14 DIAGNOSIS — I700 Atherosclerosis of aorta: Secondary | ICD-10-CM | POA: Diagnosis present

## 2023-02-14 DIAGNOSIS — Z8616 Personal history of COVID-19: Secondary | ICD-10-CM

## 2023-02-14 DIAGNOSIS — Z96652 Presence of left artificial knee joint: Secondary | ICD-10-CM | POA: Diagnosis present

## 2023-02-14 DIAGNOSIS — Z9989 Dependence on other enabling machines and devices: Secondary | ICD-10-CM

## 2023-02-14 DIAGNOSIS — R7303 Prediabetes: Secondary | ICD-10-CM

## 2023-02-14 DIAGNOSIS — E119 Type 2 diabetes mellitus without complications: Secondary | ICD-10-CM | POA: Diagnosis present

## 2023-02-14 DIAGNOSIS — Z79899 Other long term (current) drug therapy: Secondary | ICD-10-CM

## 2023-02-14 HISTORY — PX: TOTAL KNEE ARTHROPLASTY: SHX125

## 2023-02-14 LAB — GLUCOSE, CAPILLARY
Glucose-Capillary: 136 mg/dL — ABNORMAL HIGH (ref 70–99)
Glucose-Capillary: 167 mg/dL — ABNORMAL HIGH (ref 70–99)
Glucose-Capillary: 150 mg/dL — ABNORMAL HIGH (ref 70–99)

## 2023-02-14 SURGERY — ARTHROPLASTY, KNEE, TOTAL
Anesthesia: Regional | Site: Knee | Laterality: Right

## 2023-02-14 MED ORDER — PROPOFOL 10 MG/ML IV BOLUS
INTRAVENOUS | Status: AC
  Filled 2023-02-14: qty 20

## 2023-02-14 MED ORDER — ORAL CARE MOUTH RINSE: 15.0000 mL | Freq: Once | OROMUCOSAL | Status: AC

## 2023-02-14 MED ORDER — PANTOPRAZOLE SODIUM 40 MG PO TBEC
40.0000 mg | DELAYED_RELEASE_TABLET | Freq: Every day | ORAL | Status: DC
  Administered 2023-02-14 – 2023-02-16 (×3): 40 mg via ORAL
  Filled 2023-02-14 (×3): qty 1

## 2023-02-14 MED ORDER — BISOPROLOL FUMARATE 5 MG PO TABS
10.0000 mg | ORAL_TABLET | Freq: Every day | ORAL | Status: DC
  Administered 2023-02-15 – 2023-02-16 (×2): 10 mg via ORAL
  Filled 2023-02-14 (×2): qty 2

## 2023-02-14 MED ORDER — OXYCODONE HCL 5 MG PO TABS
10.0000 mg | ORAL_TABLET | ORAL | Status: DC | PRN
  Administered 2023-02-14: 10 mg via ORAL
  Administered 2023-02-15 (×4): 15 mg via ORAL
  Filled 2023-02-14: qty 2
  Filled 2023-02-14 (×4): qty 3

## 2023-02-14 MED ORDER — CEFAZOLIN SODIUM-DEXTROSE 2-4 GM/100ML-% IV SOLN
2.0000 g | INTRAVENOUS | Status: AC
  Administered 2023-02-14: 2 g via INTRAVENOUS
  Filled 2023-02-14: qty 100

## 2023-02-14 MED ORDER — ACETAMINOPHEN 10 MG/ML IV SOLN
1000.0000 mg | Freq: Once | INTRAVENOUS | Status: DC | PRN
  Administered 2023-02-14: 1000 mg via INTRAVENOUS

## 2023-02-14 MED ORDER — METHOCARBAMOL 500 MG PO TABS
500.0000 mg | ORAL_TABLET | Freq: Four times a day (QID) | ORAL | Status: DC | PRN
  Administered 2023-02-14 – 2023-02-15 (×2): 500 mg via ORAL
  Filled 2023-02-14 (×2): qty 1

## 2023-02-14 MED ORDER — DIPHENHYDRAMINE HCL 12.5 MG/5ML PO ELIX: 12.5000 mg | ORAL_SOLUTION | ORAL | Status: DC | PRN

## 2023-02-14 MED ORDER — CHLORHEXIDINE GLUCONATE 0.12 % MT SOLN
15.0000 mL | Freq: Once | OROMUCOSAL | Status: AC
  Administered 2023-02-14: 15 mL via OROMUCOSAL

## 2023-02-14 MED ORDER — ACETAMINOPHEN 10 MG/ML IV SOLN
INTRAVENOUS | Status: AC
  Filled 2023-02-14: qty 100

## 2023-02-14 MED ORDER — EPINEPHRINE PF 1 MG/ML IJ SOLN
INTRAMUSCULAR | Status: AC
  Filled 2023-02-14: qty 1

## 2023-02-14 MED ORDER — METHOCARBAMOL 500 MG IVPB - SIMPLE MED
500.0000 mg | Freq: Four times a day (QID) | INTRAVENOUS | Status: DC | PRN
  Administered 2023-02-14: 500 mg via INTRAVENOUS

## 2023-02-14 MED ORDER — ROPIVACAINE HCL 5 MG/ML IJ SOLN
INTRAMUSCULAR | Status: DC | PRN
  Administered 2023-02-14: 30 mL via PERINEURAL

## 2023-02-14 MED ORDER — AMISULPRIDE (ANTIEMETIC) 5 MG/2ML IV SOLN: 10.0000 mg | Freq: Once | INTRAVENOUS | Status: DC | PRN

## 2023-02-14 MED ORDER — HYDROMORPHONE HCL 2 MG/ML IJ SOLN
INTRAMUSCULAR | Status: AC
  Filled 2023-02-14: qty 1

## 2023-02-14 MED ORDER — LIDOCAINE HCL (PF) 2 % IJ SOLN
INTRAMUSCULAR | Status: AC
  Filled 2023-02-14: qty 5

## 2023-02-14 MED ORDER — PROPOFOL 10 MG/ML IV BOLUS
INTRAVENOUS | Status: DC | PRN
  Administered 2023-02-14: 20 mg via INTRAVENOUS
  Administered 2023-02-14: 50 mg via INTRAVENOUS
  Administered 2023-02-14: 150 mg via INTRAVENOUS
  Administered 2023-02-14: 30 mg via INTRAVENOUS

## 2023-02-14 MED ORDER — POVIDONE-IODINE 10 % EX SWAB: 2.0000 | Freq: Once | CUTANEOUS | Status: DC

## 2023-02-14 MED ORDER — FENTANYL CITRATE PF 50 MCG/ML IJ SOSY
25.0000 ug | PREFILLED_SYRINGE | INTRAMUSCULAR | Status: DC | PRN
  Administered 2023-02-14: 50 ug via INTRAVENOUS

## 2023-02-14 MED ORDER — EPHEDRINE 5 MG/ML INJ
INTRAVENOUS | Status: AC
  Filled 2023-02-14: qty 5

## 2023-02-14 MED ORDER — BUPIVACAINE HCL 0.25 % IJ SOLN
INTRAMUSCULAR | Status: AC
  Filled 2023-02-14: qty 1

## 2023-02-14 MED ORDER — ONDANSETRON HCL 4 MG/2ML IJ SOLN
INTRAMUSCULAR | Status: DC | PRN
  Administered 2023-02-14: 4 mg via INTRAVENOUS

## 2023-02-14 MED ORDER — EPHEDRINE SULFATE (PRESSORS) 50 MG/ML IJ SOLN
INTRAMUSCULAR | Status: DC | PRN
  Administered 2023-02-14 (×2): 5 mg via INTRAVENOUS

## 2023-02-14 MED ORDER — POTASSIUM CHLORIDE CRYS ER 20 MEQ PO TBCR
20.0000 meq | EXTENDED_RELEASE_TABLET | Freq: Two times a day (BID) | ORAL | Status: DC
  Administered 2023-02-14 – 2023-02-16 (×4): 20 meq via ORAL
  Filled 2023-02-14 (×4): qty 1

## 2023-02-14 MED ORDER — CHLORTHALIDONE 25 MG PO TABS
25.0000 mg | ORAL_TABLET | Freq: Every day | ORAL | Status: DC
  Administered 2023-02-14 – 2023-02-16 (×3): 25 mg via ORAL
  Filled 2023-02-14 (×3): qty 1

## 2023-02-14 MED ORDER — ROCURONIUM BROMIDE 10 MG/ML (PF) SYRINGE
PREFILLED_SYRINGE | INTRAVENOUS | Status: DC | PRN
  Administered 2023-02-14: 60 mg via INTRAVENOUS

## 2023-02-14 MED ORDER — DEXAMETHASONE SODIUM PHOSPHATE 4 MG/ML IJ SOLN
INTRAMUSCULAR | Status: DC | PRN
  Administered 2023-02-14: 4 mg via INTRAVENOUS

## 2023-02-14 MED ORDER — AMLODIPINE BESYLATE 5 MG PO TABS
5.0000 mg | ORAL_TABLET | Freq: Every day | ORAL | Status: DC
  Administered 2023-02-15 – 2023-02-16 (×2): 5 mg via ORAL
  Filled 2023-02-14 (×2): qty 1

## 2023-02-14 MED ORDER — PROPOFOL 1000 MG/100ML IV EMUL
INTRAVENOUS | Status: AC
  Filled 2023-02-14: qty 100

## 2023-02-14 MED ORDER — PHENOL 1.4 % MT LIQD: 1.0000 | OROMUCOSAL | Status: DC | PRN

## 2023-02-14 MED ORDER — FENTANYL CITRATE PF 50 MCG/ML IJ SOSY
PREFILLED_SYRINGE | INTRAMUSCULAR | Status: AC
  Administered 2023-02-14: 50 ug via INTRAVENOUS
  Filled 2023-02-14: qty 1

## 2023-02-14 MED ORDER — METHOCARBAMOL 500 MG IVPB - SIMPLE MED
INTRAVENOUS | Status: AC
  Filled 2023-02-14: qty 55

## 2023-02-14 MED ORDER — ROCURONIUM BROMIDE 10 MG/ML (PF) SYRINGE
PREFILLED_SYRINGE | INTRAVENOUS | Status: AC
  Filled 2023-02-14: qty 10

## 2023-02-14 MED ORDER — BUPIVACAINE-EPINEPHRINE 0.25% -1:200000 IJ SOLN
INTRAMUSCULAR | Status: DC | PRN
  Administered 2023-02-14: 30 mL

## 2023-02-14 MED ORDER — INSULIN ASPART 100 UNIT/ML IJ SOLN
0.0000 [IU] | Freq: Three times a day (TID) | INTRAMUSCULAR | Status: DC
  Administered 2023-02-14: 3 [IU] via SUBCUTANEOUS
  Administered 2023-02-15: 2 [IU] via SUBCUTANEOUS

## 2023-02-14 MED ORDER — DOCUSATE SODIUM 100 MG PO CAPS
100.0000 mg | ORAL_CAPSULE | Freq: Two times a day (BID) | ORAL | Status: DC
  Administered 2023-02-14 – 2023-02-16 (×4): 100 mg via ORAL
  Filled 2023-02-14 (×4): qty 1

## 2023-02-14 MED ORDER — SODIUM CHLORIDE 0.9 % IR SOLN
Status: DC | PRN
  Administered 2023-02-14: 1000 mL

## 2023-02-14 MED ORDER — ALUM & MAG HYDROXIDE-SIMETH 200-200-20 MG/5ML PO SUSP: 30.0000 mL | ORAL | Status: DC | PRN

## 2023-02-14 MED ORDER — HYDROMORPHONE HCL 1 MG/ML IJ SOLN
INTRAMUSCULAR | Status: DC | PRN
  Administered 2023-02-14 (×2): .2 mg via INTRAVENOUS
  Administered 2023-02-14: .4 mg via INTRAVENOUS
  Administered 2023-02-14 (×2): .2 mg via INTRAVENOUS
  Administered 2023-02-14: .4 mg via INTRAVENOUS

## 2023-02-14 MED ORDER — VITAMIN B-12 1000 MCG PO TABS
1000.0000 ug | ORAL_TABLET | Freq: Every day | ORAL | Status: DC
  Administered 2023-02-14 – 2023-02-16 (×3): 1000 ug via ORAL
  Filled 2023-02-14 (×3): qty 1

## 2023-02-14 MED ORDER — MIDAZOLAM HCL 2 MG/2ML IJ SOLN
INTRAMUSCULAR | Status: AC
  Filled 2023-02-14: qty 2

## 2023-02-14 MED ORDER — ONDANSETRON HCL 4 MG/2ML IJ SOLN: 4.0000 mg | Freq: Once | INTRAMUSCULAR | Status: DC | PRN

## 2023-02-14 MED ORDER — MIDAZOLAM HCL 2 MG/2ML IJ SOLN
1.0000 mg | INTRAMUSCULAR | Status: DC
  Administered 2023-02-14: 2 mg via INTRAVENOUS
  Filled 2023-02-14: qty 2

## 2023-02-14 MED ORDER — OXYCODONE HCL 5 MG PO TABS
5.0000 mg | ORAL_TABLET | ORAL | Status: DC | PRN
  Administered 2023-02-14: 5 mg via ORAL
  Administered 2023-02-16: 10 mg via ORAL
  Filled 2023-02-14: qty 2

## 2023-02-14 MED ORDER — TRANEXAMIC ACID-NACL 1000-0.7 MG/100ML-% IV SOLN
1000.0000 mg | INTRAVENOUS | Status: AC
  Administered 2023-02-14: 1000 mg via INTRAVENOUS
  Filled 2023-02-14: qty 100

## 2023-02-14 MED ORDER — STERILE WATER FOR IRRIGATION IR SOLN
Status: DC | PRN
  Administered 2023-02-14: 2000 mL

## 2023-02-14 MED ORDER — FENTANYL CITRATE (PF) 250 MCG/5ML IJ SOLN
INTRAMUSCULAR | Status: DC | PRN
  Administered 2023-02-14: 50 ug via INTRAVENOUS
  Administered 2023-02-14 (×2): 25 ug via INTRAVENOUS
  Administered 2023-02-14: 100 ug via INTRAVENOUS
  Administered 2023-02-14 (×2): 25 ug via INTRAVENOUS

## 2023-02-14 MED ORDER — ACETAMINOPHEN 325 MG PO TABS
325.0000 mg | ORAL_TABLET | Freq: Four times a day (QID) | ORAL | Status: DC | PRN
  Administered 2023-02-16: 650 mg via ORAL
  Filled 2023-02-14: qty 2

## 2023-02-14 MED ORDER — CEFAZOLIN SODIUM-DEXTROSE 2-4 GM/100ML-% IV SOLN
2.0000 g | Freq: Four times a day (QID) | INTRAVENOUS | Status: AC
  Administered 2023-02-14 (×2): 2 g via INTRAVENOUS
  Filled 2023-02-14 (×2): qty 100

## 2023-02-14 MED ORDER — MIDAZOLAM HCL 5 MG/5ML IJ SOLN
INTRAMUSCULAR | Status: DC | PRN
  Administered 2023-02-14 (×2): 1 mg via INTRAVENOUS

## 2023-02-14 MED ORDER — FENTANYL CITRATE PF 50 MCG/ML IJ SOSY
50.0000 ug | PREFILLED_SYRINGE | INTRAMUSCULAR | Status: DC
  Administered 2023-02-14: 50 ug via INTRAVENOUS
  Filled 2023-02-14: qty 2

## 2023-02-14 MED ORDER — INSULIN ASPART 100 UNIT/ML IJ SOLN: 0.0000 [IU] | Freq: Every day | INTRAMUSCULAR | Status: DC

## 2023-02-14 MED ORDER — ONDANSETRON HCL 4 MG PO TABS: 4.0000 mg | ORAL_TABLET | Freq: Four times a day (QID) | ORAL | Status: DC | PRN

## 2023-02-14 MED ORDER — OXYCODONE HCL 5 MG PO TABS
ORAL_TABLET | ORAL | Status: AC
  Filled 2023-02-14: qty 1

## 2023-02-14 MED ORDER — FENTANYL CITRATE PF 50 MCG/ML IJ SOSY
PREFILLED_SYRINGE | INTRAMUSCULAR | Status: AC
  Filled 2023-02-14: qty 1

## 2023-02-14 MED ORDER — LACTATED RINGERS IV SOLN: INTRAVENOUS | Status: DC

## 2023-02-14 MED ORDER — METOCLOPRAMIDE HCL 5 MG PO TABS: 5.0000 mg | ORAL_TABLET | Freq: Three times a day (TID) | ORAL | Status: DC | PRN

## 2023-02-14 MED ORDER — CALCITRIOL 0.25 MCG PO CAPS
0.2500 ug | ORAL_CAPSULE | Freq: Every day | ORAL | Status: DC
  Administered 2023-02-14 – 2023-02-16 (×3): 0.25 ug via ORAL
  Filled 2023-02-14 (×4): qty 1

## 2023-02-14 MED ORDER — ASPIRIN 81 MG PO CHEW
81.0000 mg | CHEWABLE_TABLET | Freq: Two times a day (BID) | ORAL | Status: DC
  Administered 2023-02-14 – 2023-02-16 (×4): 81 mg via ORAL
  Filled 2023-02-14 (×4): qty 1

## 2023-02-14 MED ORDER — METOCLOPRAMIDE HCL 5 MG/ML IJ SOLN: 5.0000 mg | Freq: Three times a day (TID) | INTRAMUSCULAR | Status: DC | PRN

## 2023-02-14 MED ORDER — 0.9 % SODIUM CHLORIDE (POUR BTL) OPTIME
TOPICAL | Status: DC | PRN
  Administered 2023-02-14: 1000 mL

## 2023-02-14 MED ORDER — DEXAMETHASONE SODIUM PHOSPHATE 10 MG/ML IJ SOLN
INTRAMUSCULAR | Status: AC
  Filled 2023-02-14: qty 1

## 2023-02-14 MED ORDER — FENTANYL CITRATE (PF) 250 MCG/5ML IJ SOLN
INTRAMUSCULAR | Status: AC
  Filled 2023-02-14: qty 5

## 2023-02-14 MED ORDER — ONDANSETRON HCL 4 MG/2ML IJ SOLN
INTRAMUSCULAR | Status: AC
  Filled 2023-02-14: qty 2

## 2023-02-14 MED ORDER — LIDOCAINE 2% (20 MG/ML) 5 ML SYRINGE
INTRAMUSCULAR | Status: DC | PRN
  Administered 2023-02-14: 80 mg via INTRAVENOUS

## 2023-02-14 MED ORDER — MENTHOL 3 MG MT LOZG: 1.0000 | LOZENGE | OROMUCOSAL | Status: DC | PRN

## 2023-02-14 MED ORDER — SUGAMMADEX SODIUM 500 MG/5ML IV SOLN
INTRAVENOUS | Status: DC | PRN
  Administered 2023-02-14: 200 mg via INTRAVENOUS

## 2023-02-14 MED ORDER — ONDANSETRON HCL 4 MG/2ML IJ SOLN: 4.0000 mg | Freq: Four times a day (QID) | INTRAMUSCULAR | Status: DC | PRN

## 2023-02-14 MED ORDER — SODIUM CHLORIDE 0.9 % IV SOLN: INTRAVENOUS | Status: DC

## 2023-02-14 MED ORDER — HYDROMORPHONE HCL 1 MG/ML IJ SOLN
0.5000 mg | INTRAMUSCULAR | Status: DC | PRN
  Administered 2023-02-14 (×2): 1 mg via INTRAVENOUS
  Filled 2023-02-14 (×2): qty 1

## 2023-02-14 SURGICAL SUPPLY — 61 items
APL SKNCLS STERI-STRIP NONHPOA (GAUZE/BANDAGES/DRESSINGS)
BAG COUNTER SPONGE SURGICOUNT (BAG) IMPLANT
BAG SPEC THK2 15X12 ZIP CLS (MISCELLANEOUS) ×1
BAG SPNG CNTER NS LX DISP (BAG)
BAG ZIPLOCK 12X15 (MISCELLANEOUS) ×1 IMPLANT
BENZOIN TINCTURE PRP APPL 2/3 (GAUZE/BANDAGES/DRESSINGS) IMPLANT
BLADE SAG 18X100X1.27 (BLADE) ×1 IMPLANT
BLADE SAW SGTL 13.0X1.19X90.0M (BLADE) IMPLANT
BLADE SURG SZ10 CARB STEEL (BLADE) ×2 IMPLANT
BNDG CMPR 5X62 HK CLSR LF (GAUZE/BANDAGES/DRESSINGS) ×2
BNDG CMPR MED 10X6 ELC LF (GAUZE/BANDAGES/DRESSINGS) ×2
BNDG ELASTIC 6INX 5YD STR LF (GAUZE/BANDAGES/DRESSINGS) ×2 IMPLANT
BNDG ELASTIC 6X10 VLCR STRL LF (GAUZE/BANDAGES/DRESSINGS) IMPLANT
BOWL SMART MIX CTS (DISPOSABLE) IMPLANT
COMP FEM PS KNEE STD 8 RT (Joint) ×1 IMPLANT
COMPONENT FEM PS KNEE STD 8 RT (Joint) IMPLANT
COOLER ICEMAN CLASSIC (MISCELLANEOUS) ×1 IMPLANT
COVER SURGICAL LIGHT HANDLE (MISCELLANEOUS) ×1 IMPLANT
CUFF TOURN SGL QUICK 34 (TOURNIQUET CUFF) ×1
CUFF TRNQT CYL 34X4.125X (TOURNIQUET CUFF) ×1 IMPLANT
DRAPE INCISE IOBAN 66X45 STRL (DRAPES) ×1 IMPLANT
DRAPE U-SHAPE 47X51 STRL (DRAPES) ×1 IMPLANT
DURAPREP 26ML APPLICATOR (WOUND CARE) ×1 IMPLANT
ELECT BLADE TIP CTD 4 INCH (ELECTRODE) ×1 IMPLANT
ELECT REM PT RETURN 15FT ADLT (MISCELLANEOUS) ×1 IMPLANT
GAUZE PAD ABD 7.5X8 STRL (GAUZE/BANDAGES/DRESSINGS) IMPLANT
GAUZE PAD ABD 8X10 STRL (GAUZE/BANDAGES/DRESSINGS) ×2 IMPLANT
GAUZE SPONGE 4X4 12PLY STRL (GAUZE/BANDAGES/DRESSINGS) ×1 IMPLANT
GAUZE XEROFORM 1X8 LF (GAUZE/BANDAGES/DRESSINGS) IMPLANT
GLOVE BIO SURGEON STRL SZ7.5 (GLOVE) ×1 IMPLANT
GLOVE BIOGEL PI IND STRL 8 (GLOVE) ×2 IMPLANT
GLOVE ECLIPSE 8.0 STRL XLNG CF (GLOVE) ×1 IMPLANT
GOWN STRL REUS W/ TWL XL LVL3 (GOWN DISPOSABLE) ×2 IMPLANT
GOWN STRL REUS W/TWL XL LVL3 (GOWN DISPOSABLE) ×2
HANDPIECE INTERPULSE COAX TIP (DISPOSABLE) ×1
HOLDER FOLEY CATH W/STRAP (MISCELLANEOUS) IMPLANT
IMMOBILIZER KNEE 20 (SOFTGOODS) ×1
IMMOBILIZER KNEE 20 THIGH 36 (SOFTGOODS) ×1 IMPLANT
IMPL PATELLA METAL SZ32X10 (Joint) IMPLANT
INSERT PS KNEE EF/6-9 13 RT (Insert) IMPLANT
KIT TURNOVER KIT A (KITS) IMPLANT
NS IRRIG 1000ML POUR BTL (IV SOLUTION) ×1 IMPLANT
PACK TOTAL KNEE CUSTOM (KITS) ×1 IMPLANT
PAD COLD SHLDR WRAP-ON (PAD) ×1 IMPLANT
PADDING CAST COTTON 6X4 STRL (CAST SUPPLIES) ×2 IMPLANT
PROS TIB KNEE PS 0D F RT (Joint) ×1 IMPLANT
PROSTHESIS TIB KNEE PS 0D F RT (Joint) IMPLANT
PROTECTOR NERVE ULNAR (MISCELLANEOUS) ×1 IMPLANT
SET HNDPC FAN SPRY TIP SCT (DISPOSABLE) ×1 IMPLANT
SET PAD KNEE POSITIONER (MISCELLANEOUS) ×1 IMPLANT
SPIKE FLUID TRANSFER (MISCELLANEOUS) IMPLANT
STAPLER VISISTAT 35W (STAPLE) IMPLANT
STRIP CLOSURE SKIN 1/2X4 (GAUZE/BANDAGES/DRESSINGS) IMPLANT
SUT MNCRL AB 4-0 PS2 18 (SUTURE) IMPLANT
SUT VIC AB 0 CT1 27 (SUTURE) ×1
SUT VIC AB 0 CT1 27XBRD ANTBC (SUTURE) ×1 IMPLANT
SUT VIC AB 1 CT1 36 (SUTURE) ×2 IMPLANT
SUT VIC AB 2-0 CT1 27 (SUTURE) ×2
SUT VIC AB 2-0 CT1 TAPERPNT 27 (SUTURE) ×2 IMPLANT
TRAY FOLEY MTR SLVR 16FR STAT (SET/KITS/TRAYS/PACK) IMPLANT
WATER STERILE IRR 1000ML POUR (IV SOLUTION) ×2 IMPLANT

## 2023-02-14 NOTE — Anesthesia Procedure Notes (Signed)
Anesthesia Regional Block: Adductor canal block   Pre-Anesthetic Checklist: , timeout performed,  Correct Patient, Correct Site, Correct Laterality,  Correct Procedure,, site marked,  Risks and benefits discussed,  Surgical consent,  Pre-op evaluation,  At surgeon's request and post-op pain management  Laterality: Right  Prep: chloraprep       Needles:  Injection technique: Single-shot  Needle Type: Echogenic Stimulator Needle     Needle Length: 10cm  Needle Gauge: 20     Additional Needles:   Procedures:,,,, ultrasound used (permanent image in chart),,    Narrative:  Start time: 02/14/2023 11:10 AM End time: 02/14/2023 11:20 AM Injection made incrementally with aspirations every 5 mL.  Performed by: Personally  Anesthesiologist: Leonides Grills, MD  Additional Notes: Functioning IV was confirmed and monitors were applied. A time-out was performed. Hand hygiene and sterile gloves were used. The thigh was placed in a frog-leg position and prepped in a sterile fashion. A 20ga Bbraun echogenic stimulator needle was placed using ultrasound guidance.  Negative aspiration and negative test dose prior to incremental administration of local anesthetic. The patient tolerated the procedure well.

## 2023-02-14 NOTE — Plan of Care (Signed)
  Problem: Education: Goal: Knowledge of the prescribed therapeutic regimen will improve Outcome: Progressing   Problem: Activity: Goal: Range of joint motion will improve Outcome: Progressing   Problem: Pain Management: Goal: Pain level will decrease with appropriate interventions Outcome: Progressing   Problem: Safety: Goal: Ability to remain free from injury will improve Outcome: Progressing   

## 2023-02-14 NOTE — Op Note (Signed)
Operative Note  Date of operation: 02/14/2023 Preoperative diagnosis: Right knee primary osteoarthritis Postoperative diagnosis: Same  Procedure: Right primary total knee arthroplasty  Implants: Biomet/Zimmer persona press-fit knee system Implant Name Type Inv. Item Serial No. Manufacturer Lot No. LRB No. Used Action  IMPL PATELLA METAL [id number] - [id number] Joint IMPL PATELLA METAL [id number]  ZIMMER [employer](ORTH,TRAU,BIO,SG)  Right 1 Implanted  [id number]     [id number] Right 1 Implanted  PROS TIB KNEE PS 0D F RT - [id number] Joint PROS TIB KNEE PS 0D F RT  ZIMMER RECON(ORTH,TRAU,BIO,SG) [id number] Right 1 Implanted   Surgeon: Vanita Panda. Magnus Ivan, MD Assistant: Tomma Lightning, RNFA  Anesthesia: #1 right lower extremity adductor canal block, #2 General, #3 local Tourniquet time: Just over 1 hour EBL: Less than 100 cc Antibiotics: IV Ancef Complications: None  Indications: The patient is a 67 year old active female with debilitating arthritis involving her right knee this been well documented on clinical exam and x-ray findings.  She has tried and failed conservative treatment for many years now including injections, weight loss, activity modification and anti-inflammatories.  She has a previous history of a left total knee replacement that was done elsewhere.  At this point her right knee pain is daily and it is detrimentally affecting her mobility, her quality of life, and her actives of daily living to the point she does wish to proceed with a total knee arthroplasty on the right side.  Having had this before on the left side she is fully aware of the risk of acute blood loss anemia, nerve vessel injury, fracture, infection, DVT, implant failure, severe pain and wound healing issues.  She understands her goals are hopefully decrease pain, improve mobility, and improve quality of life.  Procedure description: After informed consent was obtained and the appropriate right knee was marked, the  patient had an adductor canal block performed of the right lower extremity in the holding room.  She was then brought to the operating room and set up on the operating table and then placed in the supine position on the operating table where general anesthesia was obtained.  A nonsterile tourniquet is placed around the upper right thigh and her right thigh, knee, leg, and ankle were prepped and draped in DuraPrep and sterile drapes including a sterile stockinette.  A timeout was called and she was identified as the correct patient and the correct right knee.  An Esmarch was used to wrap out the leg and the tourniquet was inflated to 300 mm of pressure.  With the knee extended I then made a direct midline incision over the patella and this was carried proximally distally.  A medial parapatellar arthrotomy was made and a moderate joint effusion was encountered.  We then found significant synovitis in the knee.  With the knee flexed we found osteophytes in all 3 compartment and complete cartilage wear on the lateral aspect of the knee and the patellofemoral joint.  Removed osteophytes again from all 3 compartments and remnants of the ACL as well as medial lateral meniscus.  We then performed our proximal tibia cut correction for varus and valgus with an extramedullary cutting guide making this for 7 degree slope cut and taking 2 mm off the high side.  We then backed this down to more millimeters.  We then used a intramedullary cutting guide to the notch of the femur for distal femur cut setting this for right knee at 5 degrees externally rotated and a 10 mm distal femoral cut.  We brought  the knee back down to full extension and a 10 mm extension block was giving [place] just slight hyperextension.  We then went back to the femur and put a femoral sizing guide based off the epicondylar axis.  Based off of this we chose a size 8 femur.  We put a 4-in-1 cutting block for size 8 femur and made her anterior posterior cuts  followed by her chamfer cuts.  We then back to the tibia and chose a size right F tibial tray for coverage over the tibial plateau setting the rotation of the tibial tubercle and the femur.  We did our drill hole and keel punch off of this and we are pleased with the quality of her bone.  We then trialed our size F right tibia with our size 8 right CR standard femur.  We placed a 10 mm medial congruent polythene insert and went all the way up to a size 13 insert but I still did not like the rotational stability so we decided to go with a PS knee.  We remove the previous trial femur and put a cutting block for cutting our box on the femur part.  Once we did this we were able to try for PS implants and we went up to a size 13 mm insert and we are pleased with the PS insert in terms of range of motion and stability of the knee.  We then made a patella cut and drilled a single hole for a size 32 press-fit patella button.  We then removed all trial instrumentation from the knee and irrigate the knee with normal saline solution.  We placed Marcaine with epinephrine around the arthrotomy.  We then dried the knee-year-old well and press-fit our Biomet Zimmer persona tibial tray size F for right knee followed by press fitting our size 8 PS right femur.  We placed our 13 mm PS fixed-bearing polythene insert and press-fit our size 32 patella button.  Again we put her through several cycles of motion and we are pleased with range of motion and stability.  We then let the tourniquet down and hemostasis was obtained electrocautery.  We then closed the arthrotomy with interrupted #1 Vicryl suture followed by 0 Vicryl close deep tissue and 2-0 Vicryl to close the subcutaneous tissue.  The skin was reapproximated staples.  Well-padded sterile dressing was applied and the patient was awakened, extubated and taken recovery in stable condition.

## 2023-02-14 NOTE — Interval H&P Note (Signed)
History and Physical Interval Note: The patient understands that she is here today for right knee replacement to treat her severe right knee arthritis.  There has been no acute or interval change in her medical status.  The risks and benefits of surgery been discussed in detail and informed consent has been obtained.  The right operative knee has been marked.  02/14/2023 10:21 AM  Jill Banks  has presented today for surgery, with the diagnosis of osteoarthritis right knee.  The various methods of treatment have been discussed with the patient and family. After consideration of risks, benefits and other options for treatment, the patient has consented to  Procedure(s): RIGHT TOTAL KNEE ARTHROPLASTY (Right) as a surgical intervention.  The patient's history has been reviewed, patient examined, no change in status, stable for surgery.  I have reviewed the patient's chart and labs.  Questions were answered to the patient's satisfaction.     Kathryne Hitch

## 2023-02-14 NOTE — Evaluation (Signed)
Physical Therapy Evaluation Patient Details Name: Jill Banks MRN: [id number] DOB: 07-24-1956 Today's Date: 02/14/2023  History of Present Illness  Pt is 67 yo female admitted for 02/14/23 for R TKA.  Pt with hx including but not limited to L TKA 2020, anxiety, DM2, HLD, HTN  Clinical Impression  Pt is s/p TKA resulting in the deficits listed below (see PT Problem List). At baseline, pt is independent.  She has level entry home, RW, and home support.  Today, pt limited by pain and weakness but did ambulate 6' with RW and min A.  Pt expected to progress well for therapy.  Pt will benefit from acute skilled PT to increase their independence and safety with mobility to allow discharge.         Recommendations for follow up therapy are one component of a multi-disciplinary discharge planning process, led by the attending physician.  Recommendations may be updated based on patient status, additional functional criteria and insurance authorization.  Follow Up Recommendations       Assistance Recommended at Discharge Frequent or constant Supervision/Assistance  Patient can return home with the following  A lot of help with walking and/or transfers;A lot of help with bathing/dressing/bathroom;Assistance with cooking/housework;Help with stairs or ramp for entrance    Equipment Recommendations None recommended by PT  Recommendations for Other Services       Functional Status Assessment Patient has had a recent decline in their functional status and demonstrates the ability to make significant improvements in function in a reasonable and predictable amount of time.     Precautions / Restrictions Precautions Precautions: Knee;Fall Required Braces or Orthoses: Knee Immobilizer - Right Knee Immobilizer - Right: Discontinue once straight leg raise with < 10 degree lag Restrictions Weight Bearing Restrictions: Yes RLE Weight Bearing: Weight bearing as tolerated      Mobility  Bed  Mobility Overal bed mobility: Needs Assistance Bed Mobility: Supine to Sit     Supine to sit: Min assist, HOB elevated     General bed mobility comments: Increased time with cues for sequencing and min A for R LE    Transfers Overall transfer level: Needs assistance Equipment used: Rolling walker (2 wheels) Transfers: Sit to/from Stand Sit to Stand: Min assist, +2 safety/equipment, From elevated surface           General transfer comment: Cues for R LE management and hand placement    Ambulation/Gait Ambulation/Gait assistance: Min assist, +2 safety/equipment Gait Distance (Feet): 6 Feet Assistive device: Rolling walker (2 wheels) Gait Pattern/deviations: Step-to pattern, Decreased stride length, Decreased weight shift to right Gait velocity: decreased     General Gait Details: Mild buckling R knee with fatigue - blocked by therapist for a couple steps but when unable to correct had pt return to sitting; had chair follow; educated on RW proximity  Careers information officer    Modified Rankin (Stroke Patients Only)       Balance Overall balance assessment: Needs assistance Sitting-balance support: No upper extremity supported Sitting balance-Leahy Scale: Good     Standing balance support: Bilateral upper extremity supported, Reliant on assistive device for balance Standing balance-Leahy Scale: Poor                               Pertinent Vitals/Pain Pain Assessment Pain Assessment: 0-10 Pain Score: 8  (8/10 pre; 7/10 post) Pain Descriptors /  Indicators: Discomfort Pain Intervention(s): Limited activity within patient's tolerance, Monitored during session, Premedicated before session, Repositioned, Ice applied (Dilaudid prior; also reports feels good to change position)    Home Living Family/patient expects to be discharged to:: Private residence Living Arrangements: Spouse/significant other Available Help at Discharge:  Family;Available 24 hours/day Type of Home: House Home Access: Level entry       Home Layout: Two level;Able to live on main level with bedroom/bathroom Home Equipment: Grab bars - tub/shower;Rolling Walker (2 wheels);Cane - single point      Prior Function Prior Level of Function : Independent/Modified Independent             Mobility Comments: could ambulate in community without ad       Hand Dominance        Extremity/Trunk Assessment   Upper Extremity Assessment Upper Extremity Assessment: Overall WFL for tasks assessed    Lower Extremity Assessment Lower Extremity Assessment: LLE deficits/detail;RLE deficits/detail RLE Deficits / Details: Expected post op changes; ROM: 5 to 30 degrees; MMT: ankle 5/5, knee 1/5, hip 1/5; (limted by pain) LLE Deficits / Details: ROM WFL; MMT 5/5    Cervical / Trunk Assessment Cervical / Trunk Assessment: Normal  Communication   Communication: No difficulties  Cognition Arousal/Alertness: Awake/alert Behavior During Therapy: WFL for tasks assessed/performed Overall Cognitive Status: Within Functional Limits for tasks assessed                                          General Comments      Exercises     Assessment/Plan    PT Assessment Patient needs continued PT services  PT Problem List Decreased strength;Pain;Decreased range of motion;Decreased activity tolerance;Decreased balance;Decreased mobility;Decreased knowledge of use of DME       PT Treatment Interventions DME instruction;Therapeutic exercise;Gait training;Stair training;Functional mobility training;Patient/family education;Therapeutic activities;Modalities;Balance training    PT Goals (Current goals can be found in the Care Plan section)  Acute Rehab PT Goals Patient Stated Goal: return home PT Goal Formulation: With patient/family Time For Goal Achievement: 02/28/23 Potential to Achieve Goals: Good    Frequency 7X/week      Co-evaluation               AM-PAC PT "6 Clicks" Mobility  Outcome Measure Help needed turning from your back to your side while in a flat bed without using bedrails?: A Little Help needed moving from lying on your back to sitting on the side of a flat bed without using bedrails?: A Little Help needed moving to and from a bed to a chair (including a wheelchair)?: A Little Help needed standing up from a chair using your arms (e.g., wheelchair or bedside chair)?: A Little Help needed to walk in hospital room?: A Lot Help needed climbing 3-5 steps with a railing? : Total 6 Click Score: 15    End of Session Equipment Utilized During Treatment: Gait belt Activity Tolerance: Patient tolerated treatment well Patient left: with chair alarm set;in chair;with call bell/phone within reach Nurse Communication: Mobility status PT Visit Diagnosis: Other abnormalities of gait and mobility (R26.89);Muscle weakness (generalized) (M62.81)    Time: 7425-9563 PT Time Calculation (min) (ACUTE ONLY): 20 min   Charges:   PT Evaluation $PT Eval Low Complexity: 1 Low          Anise Salvo, PT Acute Rehab [facility] [phone]   Billey Chang  Wai Litt 02/14/2023, 5:41 PM

## 2023-02-14 NOTE — Anesthesia Preprocedure Evaluation (Addendum)
Anesthesia Evaluation  Patient identified by MRN, date of birth, ID band Patient awake    Reviewed: Allergy & Precautions, NPO status , Patient's Chart, lab work & pertinent test results  Airway Mallampati: III  TM Distance: >3 FB Neck ROM: Full    Dental no notable dental hx.    Pulmonary sleep apnea and Continuous Positive Airway Pressure Ventilation , former smoker   Pulmonary exam normal        Cardiovascular hypertension, Pt. on medications and Pt. on home beta blockers Normal cardiovascular exam     Neuro/Psych  Headaches  Anxiety        GI/Hepatic negative GI ROS, Neg liver ROS,,,  Endo/Other  diabetes  Morbid obesity  Renal/GU negative Renal ROS     Musculoskeletal  (+) Arthritis , Osteoarthritis,    Abdominal  (+) + obese  Peds  Hematology negative hematology ROS (+)   Anesthesia Other Findings osteoarthritis right knee  Reproductive/Obstetrics                             Anesthesia Physical Anesthesia Plan  ASA: 2  Anesthesia Plan: General and Regional   Post-op Pain Management: Regional block*   Induction: Intravenous  PONV Risk Score and Plan: 3 and Ondansetron, Dexamethasone, Midazolam and Treatment may vary due to age or medical condition  Airway Management Planned: Oral ETT  Additional Equipment:   Intra-op Plan:   Post-operative Plan: Extubation in OR  Informed Consent: I have reviewed the patients History and Physical, chart, labs and discussed the procedure including the risks, benefits and alternatives for the proposed anesthesia with the patient or authorized representative who has indicated his/her understanding and acceptance.     Dental advisory given  Plan Discussed with: CRNA  Anesthesia Plan Comments:        Anesthesia Quick Evaluation

## 2023-02-14 NOTE — Anesthesia Procedure Notes (Signed)
Procedure Name: Intubation Date/Time: 02/14/2023 12:08 PM  Performed by: Ludwig Lean, CRNAPre-anesthesia Checklist: Patient identified, Emergency Drugs available, Suction available and Patient being monitored Patient Re-evaluated:Patient Re-evaluated prior to induction Oxygen Delivery Method: Circle system utilized Preoxygenation: Pre-oxygenation with 100% oxygen Induction Type: IV induction Ventilation: Mask ventilation without difficulty Laryngoscope Size: Mac and 3 Grade View: Grade II Tube type: Oral Tube size: 7.0 mm Number of attempts: 1 Airway Equipment and Method: Oral airway Placement Confirmation: ETT inserted through vocal cords under direct vision, positive ETCO2 and breath sounds checked- equal and bilateral Secured at: 21 cm Tube secured with: Tape Dental Injury: Teeth and Oropharynx as per pre-operative assessment

## 2023-02-14 NOTE — Transfer of Care (Signed)
Immediate Anesthesia Transfer of Care Note  Patient: Jill Banks  Procedure(s) Performed: Procedure(s): RIGHT TOTAL KNEE ARTHROPLASTY (Right)  Patient Location: PACU  Anesthesia Type:General and Regional  Level of Consciousness: Patient easily awoken, sedated, comfortable, cooperative, following commands, responds to stimulation.   Airway & Oxygen Therapy: Patient spontaneously breathing, ventilating well, oxygen via simple oxygen mask.  Post-op Assessment: Report given to PACU RN, vital signs reviewed and stable, moving all extremities.   Post vital signs: Reviewed and stable.  Complications: No apparent anesthesia complications Last Vitals:  Vitals Value Taken Time  BP 154/78 02/14/23 1423  Temp    Pulse 77 02/14/23 1427  Resp 18 02/14/23 1427  SpO2 96 % 02/14/23 1427  Vitals shown include unvalidated device data.  Last Pain:  Vitals:   02/14/23 1129  TempSrc:   PainSc: (P) Asleep         Complications: No notable events documented.

## 2023-02-15 DIAGNOSIS — E119 Type 2 diabetes mellitus without complications: Secondary | ICD-10-CM | POA: Diagnosis present

## 2023-02-15 DIAGNOSIS — M1711 Unilateral primary osteoarthritis, right knee: Secondary | ICD-10-CM | POA: Diagnosis present

## 2023-02-15 DIAGNOSIS — E785 Hyperlipidemia, unspecified: Secondary | ICD-10-CM | POA: Diagnosis present

## 2023-02-15 DIAGNOSIS — Z79899 Other long term (current) drug therapy: Secondary | ICD-10-CM | POA: Diagnosis not present

## 2023-02-15 DIAGNOSIS — G4733 Obstructive sleep apnea (adult) (pediatric): Secondary | ICD-10-CM | POA: Diagnosis present

## 2023-02-15 DIAGNOSIS — M25761 Osteophyte, right knee: Secondary | ICD-10-CM | POA: Diagnosis present

## 2023-02-15 DIAGNOSIS — Z6841 Body Mass Index (BMI) 40.0 and over, adult: Secondary | ICD-10-CM | POA: Diagnosis not present

## 2023-02-15 DIAGNOSIS — Z823 Family history of stroke: Secondary | ICD-10-CM | POA: Diagnosis not present

## 2023-02-15 DIAGNOSIS — Z83438 Family history of other disorder of lipoprotein metabolism and other lipidemia: Secondary | ICD-10-CM | POA: Diagnosis not present

## 2023-02-15 DIAGNOSIS — Z8616 Personal history of COVID-19: Secondary | ICD-10-CM | POA: Diagnosis not present

## 2023-02-15 DIAGNOSIS — Z96652 Presence of left artificial knee joint: Secondary | ICD-10-CM | POA: Diagnosis present

## 2023-02-15 DIAGNOSIS — I700 Atherosclerosis of aorta: Secondary | ICD-10-CM | POA: Diagnosis present

## 2023-02-15 DIAGNOSIS — Z87891 Personal history of nicotine dependence: Secondary | ICD-10-CM | POA: Diagnosis not present

## 2023-02-15 DIAGNOSIS — Z803 Family history of malignant neoplasm of breast: Secondary | ICD-10-CM | POA: Diagnosis not present

## 2023-02-15 DIAGNOSIS — Z8249 Family history of ischemic heart disease and other diseases of the circulatory system: Secondary | ICD-10-CM | POA: Diagnosis not present

## 2023-02-15 DIAGNOSIS — I10 Essential (primary) hypertension: Secondary | ICD-10-CM | POA: Diagnosis present

## 2023-02-15 DIAGNOSIS — Z825 Family history of asthma and other chronic lower respiratory diseases: Secondary | ICD-10-CM | POA: Diagnosis not present

## 2023-02-15 DIAGNOSIS — E669 Obesity, unspecified: Secondary | ICD-10-CM | POA: Diagnosis present

## 2023-02-15 LAB — BASIC METABOLIC PANEL
Anion gap: 10 (ref 5–15)
BUN: 16 mg/dL (ref 8–23)
CO2: 27 mmol/L (ref 22–32)
Calcium: 8.5 mg/dL — ABNORMAL LOW (ref 8.9–10.3)
Chloride: 98 mmol/L (ref 98–111)
Creatinine, Ser: 0.74 mg/dL (ref 0.44–1.00)
GFR, Estimated: >60 mL/min (ref >60)
Glucose, Bld: 150 mg/dL — ABNORMAL HIGH (ref 70–99)
Potassium: 3.3 mmol/L — ABNORMAL LOW (ref 3.5–5.1)
Sodium: 135 mmol/L (ref 135–145)

## 2023-02-15 LAB — CBC
HCT: 38.3 % (ref 36.0–46.0)
Hemoglobin: 12.3 g/dL (ref 12.0–15.0)
MCH: 27.8 pg (ref 26.0–34.0)
MCHC: 32.1 g/dL (ref 30.0–36.0)
MCV: 86.5 fL (ref 80.0–100.0)
Platelets: 113 10*3/uL — ABNORMAL LOW (ref 150–400)
RBC: 4.43 MIL/uL (ref 3.87–5.11)
RDW: 15.5 % (ref 11.5–15.5)
WBC: 12 10*3/uL — ABNORMAL HIGH (ref 4.0–10.5)
nRBC: 0 % (ref 0.0–0.2)

## 2023-02-15 LAB — GLUCOSE, CAPILLARY
Glucose-Capillary: 117 mg/dL — ABNORMAL HIGH (ref 70–99)
Glucose-Capillary: 139 mg/dL — ABNORMAL HIGH (ref 70–99)
Glucose-Capillary: 113 mg/dL — ABNORMAL HIGH (ref 70–99)
Glucose-Capillary: 134 mg/dL — ABNORMAL HIGH (ref 70–99)

## 2023-02-15 MED ORDER — TIZANIDINE HCL 4 MG PO TABS: 4.0000 mg | ORAL_TABLET | Freq: Four times a day (QID) | ORAL | 0 refills | Status: DC | PRN

## 2023-02-15 MED ORDER — KETOROLAC TROMETHAMINE 15 MG/ML IJ SOLN
7.5000 mg | Freq: Four times a day (QID) | INTRAMUSCULAR | Status: AC
  Administered 2023-02-15 – 2023-02-16 (×3): 7.5 mg via INTRAVENOUS
  Filled 2023-02-15 (×3): qty 1

## 2023-02-15 MED ORDER — ASPIRIN 81 MG PO CHEW: 81.0000 mg | CHEWABLE_TABLET | Freq: Two times a day (BID) | ORAL | 0 refills | Status: DC

## 2023-02-15 MED ORDER — LIP MEDEX EX OINT
TOPICAL_OINTMENT | CUTANEOUS | Status: DC | PRN
  Filled 2023-02-15: qty 7

## 2023-02-15 MED ORDER — OXYCODONE HCL 5 MG PO TABS: 5.0000 mg | ORAL_TABLET | Freq: Four times a day (QID) | ORAL | 0 refills | Status: DC | PRN

## 2023-02-15 MED ORDER — TIZANIDINE HCL 4 MG PO TABS
4.0000 mg | ORAL_TABLET | Freq: Four times a day (QID) | ORAL | Status: DC | PRN
  Administered 2023-02-15: 4 mg via ORAL
  Filled 2023-02-15: qty 1

## 2023-02-15 NOTE — Progress Notes (Signed)
Physical Therapy Treatment Patient Details Name: Jill Banks MRN: [id number] DOB: 07-15-1956 Today's Date: 02/15/2023   History of Present Illness Pt is 67 yo female admitted for 02/14/23 for R TKA.  Pt with hx including but not limited to L TKA 2020, anxiety, DM2, HLD, HTN    PT Comments    Progressing slowly with mobility. Mobility limited by pain. Will continue to progress activity as pt is able to tolerate.    Recommendations for follow up therapy are one component of a multi-disciplinary discharge planning process, led by the attending physician.  Recommendations may be updated based on patient status, additional functional criteria and insurance authorization.  Follow Up Recommendations       Assistance Recommended at Discharge Frequent or constant Supervision/Assistance  Patient can return home with the following A lot of help with walking and/or transfers;A lot of help with bathing/dressing/bathroom;Assistance with cooking/housework;Help with stairs or ramp for entrance   Equipment Recommendations  None recommended by PT    Recommendations for Other Services       Precautions / Restrictions Precautions Precautions: Knee;Fall Required Braces or Orthoses: Knee Immobilizer - Right Knee Immobilizer - Right: Discontinue once straight leg raise with < 10 degree lag Restrictions Weight Bearing Restrictions: No RLE Weight Bearing: Weight bearing as tolerated     Mobility  Bed Mobility Overal bed mobility: Needs Assistance Bed Mobility: Supine to Sit     Supine to sit: Min guard, HOB elevated     General bed mobility comments: Pt relied on bedrail. Increased time.    Transfers Overall transfer level: Needs assistance Equipment used: Rolling walker (2 wheels) Transfers: Sit to/from Stand Sit to Stand: Min assist, From elevated surface           General transfer comment: Assist to power up, stabilize, control descent. Cues for safety, technique, hand/LE  placement. Increased time.    Ambulation/Gait Ambulation/Gait assistance: Min assist Gait Distance (Feet): 25 Feet Assistive device: Rolling walker (2 wheels) Gait Pattern/deviations: Step-to pattern, Decreased stride length, Decreased weight shift to right, Antalgic       General Gait Details: KI worn this sesison. Cues for safety, technique, sequence. Slow, effortful gait. Ambulation distance limited by pain.  Followed with recliner and used it to transport pt back to room.   Stairs             Wheelchair Mobility    Modified Rankin (Stroke Patients Only)       Balance Overall balance assessment: Needs assistance         Standing balance support: Bilateral upper extremity supported, Reliant on assistive device for balance Standing balance-Leahy Scale: Poor                              Cognition Arousal/Alertness: Awake/alert Behavior During Therapy: WFL for tasks assessed/performed Overall Cognitive Status: Within Functional Limits for tasks assessed                                          Exercises Total Joint Exercises Ankle Circles/Pumps: AROM, Both, 10 reps Quad Sets: AROM, Right, 5 reps Hip ABduction/ADduction: AAROM, Right, 5 reps Straight Leg Raises: AAROM, Right, 5 reps    General Comments        Pertinent Vitals/Pain Pain Assessment Pain Assessment: 0-10 Pain Score: 8  Pain Location: R knee Pain  Descriptors / Indicators: Grimacing, Guarding, Aching, Sharp Pain Intervention(s): Limited activity within patient's tolerance, Monitored during session, Ice applied, Repositioned    Home Living                          Prior Function            PT Goals (current goals can now be found in the care plan section) Progress towards PT goals: Progressing toward goals    Frequency    7X/week      PT Plan Current plan remains appropriate    Co-evaluation              AM-PAC PT "6 Clicks"  Mobility   Outcome Measure  Help needed turning from your back to your side while in a flat bed without using bedrails?: A Little Help needed moving from lying on your back to sitting on the side of a flat bed without using bedrails?: A Little Help needed moving to and from a bed to a chair (including a wheelchair)?: A Little Help needed standing up from a chair using your arms (e.g., wheelchair or bedside chair)?: A Little Help needed to walk in hospital room?: A Little Help needed climbing 3-5 steps with a railing? : Total 6 Click Score: 16    End of Session Equipment Utilized During Treatment: Gait belt Activity Tolerance: Patient limited by pain;Patient limited by fatigue Patient left: in chair;with call bell/phone within reach   PT Visit Diagnosis: Other abnormalities of gait and mobility (R26.89);Muscle weakness (generalized) (M62.81)     Time: 1610-9604 PT Time Calculation (min) (ACUTE ONLY): 22 min  Charges:  $Gait Training: 8-22 mins                         Faye Ramsay, PT Acute Rehabilitation  Office: [phone]

## 2023-02-15 NOTE — Progress Notes (Signed)
Subjective: 1 Day Post-Op Procedure(s) (LRB): RIGHT TOTAL KNEE ARTHROPLASTY (Right) Patient reports pain as moderate.  Very slow mobility with therapy secondary to pain control.  Objective: Vital signs in last 24 hours: Temp:  [98.1 F (36.7 C)-98.7 F (37.1 C)] 98.7 F (37.1 C) (06/01 0652) Pulse Rate:  [59-77] 68 (06/01 0652) Resp:  [11-18] 17 (06/01 0652) BP: (143-164)/(64-97) 143/64 (06/01 0652) SpO2:  [92 %-100 %] 99 % (06/01 0652)  Intake/Output from previous day: 05/31 0701 - 06/01 0700 In: 1933.1 [P.O.:537; I.V.:941.1; IV Piggyback:455] Out: 425 [Urine:400; Blood:25] Intake/Output this shift: Total I/O In: 240 [P.O.:240] Out: -   Recent Labs    02/15/23 0340  HGB 12.3   Recent Labs    02/15/23 0340  WBC 12.0*  RBC 4.43  HCT 38.3  PLT 113*   Recent Labs    02/15/23 0340  NA 135  K 3.3*  CL 98  CO2 27  BUN 16  CREATININE 0.74  GLUCOSE 150*  CALCIUM 8.5*   No results for input(s): "LABPT", "INR" in the last 72 hours.  Sensation intact distally Intact pulses distally Dorsiflexion/Plantar flexion intact Incision: dressing C/D/I Compartment soft   Assessment/Plan: 1 Day Post-Op Procedure(s) (LRB): RIGHT TOTAL KNEE ARTHROPLASTY (Right) Up with therapy Plan for discharge tomorrow Discharge home with home health      Jill Banks 02/15/2023, 11:47 AM

## 2023-02-15 NOTE — Progress Notes (Signed)
Physical Therapy Treatment Patient Details Name: Jill Banks MRN: [id number] DOB: Sep 26, 1955 Today's Date: 02/15/2023   History of Present Illness Pt is 67 yo female admitted for 02/14/23 for R TKA.  Pt with hx including but not limited to L TKA 2020, anxiety, DM2, HLD, HTN    PT Comments    Progressing with mobility. Moderate pain with activity.    Recommendations for follow up therapy are one component of a multi-disciplinary discharge planning process, led by the attending physician.  Recommendations may be updated based on patient status, additional functional criteria and insurance authorization.  Follow Up Recommendations       Assistance Recommended at Discharge Frequent or constant Supervision/Assistance  Patient can return home with the following A little help with walking and/or transfers;A lot of help with bathing/dressing/bathroom;Assistance with cooking/housework;Assist for transportation;Help with stairs or ramp for entrance   Equipment Recommendations  None recommended by PT    Recommendations for Other Services       Precautions / Restrictions Precautions Precautions: Fall;Knee Required Braces or Orthoses: Knee Immobilizer - Right Knee Immobilizer - Right: Discontinue once straight leg raise with < 10 degree lag Restrictions Weight Bearing Restrictions: No RLE Weight Bearing: Weight bearing as tolerated     Mobility  Bed Mobility Overal bed mobility: Needs Assistance Bed Mobility: Supine to Sit     Supine to sit: Min guard, HOB elevated     General bed mobility comments: oob in recliner    Transfers Overall transfer level: Needs assistance Equipment used: Rolling walker (2 wheels) Transfers: Sit to/from Stand Sit to Stand: Min assist           General transfer comment: Assist to steady. Increased time.Cues for safety, hand/LE placement.    Ambulation/Gait Ambulation/Gait assistance: Min assist Gait Distance (Feet): 60 Feet Assistive  device: Rolling walker (2 wheels) Gait Pattern/deviations: Step-to pattern, Decreased stride length, Decreased weight shift to right, Antalgic       General Gait Details: KI worn this sesison again to help with pain control. Cues for safety, technique, sequence. Ambulation distance limited by pain, fatigue but improving.   Stairs             Wheelchair Mobility    Modified Rankin (Stroke Patients Only)       Balance Overall balance assessment: Needs assistance         Standing balance support: Bilateral upper extremity supported, Reliant on assistive device for balance, During functional activity Standing balance-Leahy Scale: Poor                              Cognition Arousal/Alertness: Awake/alert Behavior During Therapy: WFL for tasks assessed/performed Overall Cognitive Status: Within Functional Limits for tasks assessed                                          Exercises Total Joint Exercises Ankle Circles/Pumps: AROM, Both, 10 reps Quad Sets: AROM, Right, 5 reps Hip ABduction/ADduction: AAROM, Right, 5 reps Straight Leg Raises: AAROM, Right, 5 reps    General Comments        Pertinent Vitals/Pain Pain Assessment Pain Assessment: 0-10 Pain Score: 7  Pain Location: R knee/thigh Pain Descriptors / Indicators: Grimacing, Guarding, Aching, Sharp Pain Intervention(s): Monitored during session, Limited activity within patient's tolerance, Ice applied, Repositioned    Home Living  Prior Function            PT Goals (current goals can now be found in the care plan section) Progress towards PT goals: Progressing toward goals    Frequency    7X/week      PT Plan Current plan remains appropriate    Co-evaluation              AM-PAC PT "6 Clicks" Mobility   Outcome Measure  Help needed turning from your back to your side while in a flat bed without using bedrails?: A  Little Help needed moving from lying on your back to sitting on the side of a flat bed without using bedrails?: A Little Help needed moving to and from a bed to a chair (including a wheelchair)?: A Little Help needed standing up from a chair using your arms (e.g., wheelchair or bedside chair)?: A Little Help needed to walk in hospital room?: A Little Help needed climbing 3-5 steps with a railing? : A Lot 6 Click Score: 17    End of Session Equipment Utilized During Treatment: Gait belt;Right knee immobilizer Activity Tolerance: Patient limited by pain;Patient limited by fatigue Patient left: in chair;with call bell/phone within reach   PT Visit Diagnosis: Other abnormalities of gait and mobility (R26.89);Muscle weakness (generalized) (M62.81)     Time: 1324-4010 PT Time Calculation (min) (ACUTE ONLY): 20 min  Charges:  $Gait Training: 8-22 mins                        Faye Ramsay, PT Acute Rehabilitation  Office: [phone]

## 2023-02-15 NOTE — Discharge Instructions (Signed)

## 2023-02-16 LAB — GLUCOSE, CAPILLARY
Glucose-Capillary: 108 mg/dL — ABNORMAL HIGH (ref 70–99)
Glucose-Capillary: 83 mg/dL (ref 70–99)

## 2023-02-16 NOTE — TOC Initial Note (Addendum)
Transition of Care [facility]) - Initial/Assessment Note    Patient Details  Name: Jill Banks MRN: [id number] Date of Birth: [birth year]  Transition of Care [facility]) CM/SW Contact:    Georgie Chard, LCSW Phone Number: 02/16/2023, 12:11 PM  Clinical Narrative:                 CSW met patient at bed side. At  this time patient is only needing HH-PT. CSW ask patient TOC assessment questions patient denies/ reports no other needs at this time from [facility]. This CSW has set up Home Health PT with [facility]/ Elnita [facility]. CSW has reached back out to inform patient as well providers. Patient expected to DC home today reports having support from husband.   Expected Discharge Plan: Home w Home Health Services Barriers to Discharge: No Barriers Identified   Patient Goals and CMS Choice            Expected Discharge Plan and Services       Living arrangements for the past 2 months: Single Family Home Expected Discharge Date: 02/16/23                           [facility] Agency: [employer] [facility] Home Health Services Date [facility] Agency Contacted: 02/16/23 Time HH Agency Contacted: 1211 Representative spoke with at [facility] Agency: Elnita Maxwell  Prior Living Arrangements/Services Living arrangements for the past 2 months: Single Family Home Lives with:: Significant Other, Spouse Patient language and need for interpreter reviewed:: No Do you feel safe going back to the place where you live?: Yes      Need for Family Participation in Patient Care: No (Comment) Care giver support system in place?: Yes (comment) Current home services: DME, Homehealth aide, Home PT Criminal Activity/Legal Involvement Pertinent to Current Situation/Hospitalization: No - Comment as needed  Activities of Daily Living Home Assistive Devices/Equipment: Eyeglasses, Blood pressure cuff, Grab bars in shower, Shower chair without back, Raised toilet seat with rails, Cane (specify quad or straight), Walker (specify type) (straight cane, rolling  walker) ADL Screening (condition at time of admission) Patient's cognitive ability adequate to safely complete daily activities?: Yes Is the patient deaf or have difficulty hearing?: No Does the patient have difficulty seeing, even when wearing glasses/contacts?: No Does the patient have difficulty concentrating, remembering, or making decisions?: No Patient able to express need for assistance with ADLs?: Yes Does the patient have difficulty dressing or bathing?: No Independently performs ADLs?: Yes (appropriate for developmental age) Does the patient have difficulty walking or climbing stairs?: Yes Weakness of Legs: Right Weakness of Arms/Hands: None  Permission Sought/Granted                  Emotional Assessment Appearance:: Appears stated age Attitude/Demeanor/Rapport: Gracious Affect (typically observed): Calm Orientation: : Oriented to Self, Oriented to  Time, Oriented to Situation, Oriented to Place Alcohol / Substance Use: Not Applicable Psych Involvement: No (comment)  Admission diagnosis:  Status post total right knee replacement [Z96.651] Patient Active Problem List   Diagnosis Date Noted   Status post total right knee replacement 02/14/2023   Unilateral primary osteoarthritis, right knee 10/21/2022   Thrombocytopenia (HCC) 10/15/2022   Endometrial polyp    Postmenopausal bleeding    Uterine enlargement 09/17/2020   BMI 40.0-44.9, adult (HCC) 09/17/2020   Uterine leiomyoma 08/26/2020   Aortic atherosclerosis (HCC) 08/26/2020   Diastasis of rectus abdominis 08/08/2020   Encounter for health maintenance examination with abnormal findings 07/05/2020   Bone  spur of right ankle 07/05/2020   S/P TKR (total knee replacement) using cement, left 07/27/2019   Encounter for general adult medical examination with abnormal findings 07/01/2019   Adjustment insomnia 07/01/2019   Encounter for screening mammogram for malignant neoplasm of breast 07/01/2019   Routine  cervical smear 07/01/2019   Ventral hernia without obstruction or gangrene 07/01/2019   Pain and swelling of ankle, right 01/13/2019   Acute vaginitis 01/11/2018   Bacterial vaginitis 12/18/2017   Inflammatory polyarthritis (HCC) 12/18/2017   Impaired fasting glucose 09/23/2017   Obesity, unspecified 09/23/2017   Hypokalemia 09/23/2017   Melanocytic nevi, unspecified 09/23/2017   Pain in right knee 09/23/2017   Dysuria 09/23/2017   Pain in right ankle and joints of right foot 09/23/2017   Essential hypertension 09/23/2017   Other fatigue 09/23/2017   Vitamin D deficiency, unspecified 09/23/2017   Iron deficiency anemia, unspecified 09/23/2017   OSA on CPAP 09/23/2017   PCP:  Sallyanne Kuster, NP Pharmacy:   [facility] - [place], [place] - [address] [place] [address] Phone: [phone] Fax: [phone]  [facility] [phone] - [place], [place] - [address] AT [facility] [address] [place] [address] Phone: [phone] Fax: [phone]     Social Determinants of Health (SDOH) Social History: SDOH Screenings   Food Insecurity: No Food Insecurity (02/14/2023)  Housing: Low Risk  (02/14/2023)  Transportation Needs: No Transportation Needs (02/14/2023)  Utilities: Not At Risk (02/14/2023)  Alcohol Screen: Low Risk  (02/18/2022)  Depression (PHQ2-9): Low Risk  (10/15/2022)  Tobacco Use: Medium Risk (02/14/2023)   SDOH Interventions:     Readmission Risk Interventions    02/16/2023   12:09 PM  Readmission Risk Prevention Plan  Post Dischage Appt Complete  Medication Screening Complete  Transportation Screening Complete

## 2023-02-16 NOTE — Plan of Care (Signed)
  Problem: Coping: Goal: Level of anxiety will decrease Outcome: Progressing   Problem: Pain Managment: Goal: General experience of comfort will improve Outcome: Progressing   

## 2023-02-16 NOTE — Progress Notes (Signed)
Physical Therapy Treatment Patient Details Name: Jill Banks MRN: [id number] DOB: 14-Nov-1955 Today's Date: 02/16/2023   History of Present Illness Pt is 67 yo female admitted for 02/14/23 for R TKA.  Pt with hx including but not limited to L TKA 2020, anxiety, DM2, HLD, HTN    PT Comments    Progressing with mobility. Pt reports improved pain control on today. Encouraged her to ambulate often at home, as tolerated. PT education completed.   Recommendations for follow up therapy are one component of a multi-disciplinary discharge planning process, led by the attending physician.  Recommendations may be updated based on patient status, additional functional criteria and insurance authorization.  Follow Up Recommendations       Assistance Recommended at Discharge Frequent or constant Supervision/Assistance  Patient can return home with the following A little help with walking and/or transfers;A lot of help with bathing/dressing/bathroom;Assistance with cooking/housework;Assist for transportation;Help with stairs or ramp for entrance   Equipment Recommendations  None recommended by PT    Recommendations for Other Services       Precautions / Restrictions Precautions Precautions: Fall;Knee Required Braces or Orthoses: Knee Immobilizer - Right (wearing for comfort/pain control) Knee Immobilizer - Right: Discontinue once straight leg raise with < 10 degree lag Restrictions Weight Bearing Restrictions: No RLE Weight Bearing: Weight bearing as tolerated     Mobility  Bed Mobility Overal bed mobility: Needs Assistance Bed Mobility: Supine to Sit     Supine to sit: Supervision, HOB elevated     General bed mobility comments: Supv for safety. Pt used gait belt as leg lifter    Transfers Overall transfer level: Needs assistance Equipment used: Rolling walker (2 wheels) Transfers: Sit to/from Stand Sit to Stand: Min guard, From elevated surface           General transfer  comment: Min guard A for safety. Increased time.Cues for safety, hand/LE placement.    Ambulation/Gait Ambulation/Gait assistance: Min guard Gait Distance (Feet): 75 Feet Assistive device: Rolling walker (2 wheels) Gait Pattern/deviations: Step-to pattern       General Gait Details: Min guard for safety. KI worn today (pt preference/pain control). No LOB with RW use. Pt denied dizziness.   Stairs             Wheelchair Mobility    Modified Rankin (Stroke Patients Only)       Balance Overall balance assessment: Needs assistance         Standing balance support: Bilateral upper extremity supported, Reliant on assistive device for balance, During functional activity Standing balance-Leahy Scale: Poor                              Cognition Arousal/Alertness: Awake/alert Behavior During Therapy: WFL for tasks assessed/performed Overall Cognitive Status: Within Functional Limits for tasks assessed                                          Exercises Total Joint Exercises Ankle Circles/Pumps: AROM, Both, 10 reps Quad Sets: AROM, Right, 10 reps Heel Slides: AAROM, Right, 10 reps Straight Leg Raises: AAROM, Right, 10 reps Goniometric ROM: ~10-60 degrees    General Comments        Pertinent Vitals/Pain Pain Assessment Pain Assessment: 0-10 Pain Score: 5  Pain Location: R knee/thigh Pain Descriptors / Indicators: Discomfort, Aching, Sore, Tightness Pain  Intervention(s): Monitored during session, Ice applied, Repositioned    Home Living                          Prior Function            PT Goals (current goals can now be found in the care plan section) Progress towards PT goals: Progressing toward goals    Frequency    7X/week      PT Plan Current plan remains appropriate    Co-evaluation              AM-PAC PT "6 Clicks" Mobility   Outcome Measure  Help needed turning from your back to your side  while in a flat bed without using bedrails?: A Little Help needed moving from lying on your back to sitting on the side of a flat bed without using bedrails?: A Little Help needed moving to and from a bed to a chair (including a wheelchair)?: A Little Help needed standing up from a chair using your arms (e.g., wheelchair or bedside chair)?: A Little Help needed to walk in hospital room?: A Little Help needed climbing 3-5 steps with a railing? : A Lot 6 Click Score: 17    End of Session Equipment Utilized During Treatment: Gait belt;Right knee immobilizer Activity Tolerance: Patient tolerated treatment well Patient left: in chair;with call bell/phone within reach   PT Visit Diagnosis: Other abnormalities of gait and mobility (R26.89);Pain Pain - Right/Left: Right Pain - part of body: Knee     Time: 1610-9604 PT Time Calculation (min) (ACUTE ONLY): 21 min  Charges:  $Gait Training: 8-22 mins                         Faye Ramsay, PT Acute Rehabilitation  Office: [phone]

## 2023-02-17 NOTE — Anesthesia Postprocedure Evaluation (Signed)
Anesthesia Post Note  Patient: Jill Banks  Procedure(s) Performed: RIGHT TOTAL KNEE ARTHROPLASTY (Right: Knee)     Patient location during evaluation: PACU Anesthesia Type: Regional and General Level of consciousness: awake Pain management: pain level controlled Vital Signs Assessment: post-procedure vital signs reviewed and stable Respiratory status: spontaneous breathing, nonlabored ventilation and respiratory function stable Cardiovascular status: blood pressure returned to baseline and stable Postop Assessment: no apparent nausea or vomiting Anesthetic complications: no   No notable events documented.  Last Vitals:  Vitals:   02/16/23 0922 02/16/23 1052  BP:    Pulse:    Resp:    Temp: 37.3 C 36.9 C  SpO2:      Last Pain:  Vitals:   02/16/23 1052  TempSrc: Oral  PainSc:    Pain Goal: Patients Stated Pain Goal: 2 (02/16/23 0900)                 Alycia Rossetti P Samiyyah Moffa

## 2023-02-17 NOTE — Discharge Summary (Signed)
Patient ID: Jill Banks MRN: [id number] DOB/AGE: [birth year] 67 y.o.  Admit date: 02/14/2023 Discharge date: 02/16/23  Admission Diagnoses:  Principal Problem:   Unilateral primary osteoarthritis, right knee Active Problems:   Status post total right knee replacement   Discharge Diagnoses:  Same  Past Medical History:  Diagnosis Date   Anxiety    Arthritis    Blood dyscrasia    Thrombocytopenia   Bruises easily    COVID-19 04/2019   Diabetes mellitus without complication (HCC)    Headache    HLD (hyperlipidemia)    Hypertension    Leg pain    Pneumonia    about 5 yrs ago   Sleep apnea    no CPAP    Surgeries: Procedure(s): RIGHT TOTAL KNEE ARTHROPLASTY on 02/14/2023   Consultants:   Discharged Condition: Improved  Hospital Course: Jill Banks is an 67 y.o. female who was admitted 02/14/2023 for operative treatment ofUnilateral primary osteoarthritis, right knee. Patient has severe unremitting pain that affects sleep, daily activities, and work/hobbies. After pre-op clearance the patient was taken to the operating room on 02/14/2023 and underwent  Procedure(s): RIGHT TOTAL KNEE ARTHROPLASTY.    Patient was given perioperative antibiotics:  Anti-infectives (From admission, onward)    Start     Dose/Rate Route Frequency Ordered Stop   02/14/23 1800  ceFAZolin (ANCEF) IVPB 2g/100 mL premix        2 g 200 mL/hr over 30 Minutes Intravenous Every 6 hours 02/14/23 1606 02/15/23 0010   02/14/23 0930  ceFAZolin (ANCEF) IVPB 2g/100 mL premix        2 g 200 mL/hr over 30 Minutes Intravenous On call to O.R. 02/14/23 [id number] 02/14/23 1209        Patient was given sequential compression devices, early ambulation, and chemoprophylaxis to prevent DVT.  Patient benefited maximally from hospital stay and there were no complications.    Recent vital signs: Patient Vitals for the past 24 hrs:  Temp Temp src  02/16/23 1052 98.5 F (36.9 C) Oral  02/16/23 0922 99.1 F (37.3  C) Oral     Recent laboratory studies:  Recent Labs    02/15/23 0340  WBC 12.0*  HGB 12.3  HCT 38.3  PLT 113*  NA 135  K 3.3*  CL 98  CO2 27  BUN 16  CREATININE 0.74  GLUCOSE 150*  CALCIUM 8.5*     Discharge Medications:   Allergies as of 02/16/2023   No Known Allergies      Medication List     TAKE these medications    Accu-Chek Softclix Lancets lancets Use as instructed to check blood sugars once daily   amLODipine 5 MG tablet Commonly known as: NORVASC Take 1 tablet (5 mg total) by mouth daily.   aspirin 81 MG chewable tablet Chew 1 tablet (81 mg total) by mouth 2 (two) times daily.   bisoprolol 10 MG tablet Commonly known as: ZEBETA TAKE ONE TABLET BY MOUTH ONCE DAILY   calcitRIOL 0.25 MCG capsule Commonly known as: ROCALTROL Take 1 capsule (0.25 mcg total) by mouth daily.   chlorthalidone 25 MG tablet Commonly known as: HYGROTON TAKE 1 TABLET BY MOUTH ONCE DAILY.   cyanocobalamin 1000 MCG tablet Commonly known as: VITAMIN B12 Take 1 tablet (1,000 mcg total) by mouth daily.   diclofenac sodium 1 % Gel Commonly known as: VOLTAREN Apply 2 g topically 4 (four) times daily as needed (knee pain).   glucose blood test strip Use as instructed to  check blood sugars once daily E11.65   naproxen sodium 220 MG tablet Commonly known as: ALEVE Take 440 mg by mouth 2 (two) times daily as needed (pain).   NON FORMULARY cpap device   OneTouch Verio Flex System w/Device Kit   oxyCODONE 5 MG immediate release tablet Commonly known as: Oxy IR/ROXICODONE Take 1-2 tablets (5-10 mg total) by mouth every 6 (six) hours as needed for moderate pain (pain score 4-6).   potassium chloride SA 20 MEQ tablet Commonly known as: KLOR-CON M TAKE 1 TABLET BY MOUTH TWICE DAILY   tiZANidine 4 MG tablet Commonly known as: ZANAFLEX Take 1 tablet (4 mg total) by mouth every 6 (six) hours as needed for muscle spasms.        Diagnostic Studies: DG Knee Right  Port  Result Date: 02/14/2023 CLINICAL DATA:  [id number] Status post total right knee replacement [id number] EXAM: PORTABLE RIGHT KNEE - 1-2 VIEW COMPARISON:  Radiograph 10/21/2018 FINDINGS: Four postsurgical changes of right total knee arthroplasty. Normal alignment. No evidence of loosening or periprosthetic fracture. Expected soft tissue changes. IMPRESSION: Postsurgical changes of right total knee arthroplasty. No evidence of immediate hardware complication. Electronically Signed   By: Caprice Renshaw M.D.   On: 02/14/2023 16:40    Disposition: Discharge disposition: 01-Home or Self Care          Follow-up Information     Kathryne Hitch, MD Follow up in 2 week(s).   Specialty: Orthopedic Surgery Contact information: [address] [place] [address] [phone]                  Signed: Kathryne Hitch 02/17/2023, 7:58 AM

## 2023-02-18 ENCOUNTER — Encounter (HOSPITAL_COMMUNITY): Payer: Self-pay | Admitting: Orthopaedic Surgery

## 2023-02-27 ENCOUNTER — Ambulatory Visit (INDEPENDENT_AMBULATORY_CARE_PROVIDER_SITE_OTHER): Payer: Medicare HMO | Admitting: Orthopaedic Surgery

## 2023-02-27 ENCOUNTER — Other Ambulatory Visit: Payer: Self-pay | Admitting: Nurse Practitioner

## 2023-02-27 ENCOUNTER — Encounter: Payer: Self-pay | Admitting: Orthopaedic Surgery

## 2023-02-27 DIAGNOSIS — Z96651 Presence of right artificial knee joint: Secondary | ICD-10-CM

## 2023-02-27 DIAGNOSIS — I10 Essential (primary) hypertension: Secondary | ICD-10-CM

## 2023-02-27 MED ORDER — OXYCODONE HCL 5 MG PO TABS: 5.0000 mg | ORAL_TABLET | Freq: Four times a day (QID) | ORAL | 0 refills | Status: DC | PRN

## 2023-02-27 NOTE — Progress Notes (Signed)
The patient is here today for her first postoperative visit status post a right total knee replacement.  She has remote history of a left knee replacement.  On exam her right knee has almost full extension and flexion is to maybe 60 degrees.  She knows she has to push this further.  She has been having home health PT and understands we need her to transition to outpatient PT soon.  The incision looks good.  The staples are removed and Steri-Strips applied.  Her calf is soft.  Her motor and sensory exam is normal in her foot.  She will transition to outpatient therapy.  Will send in more pain medicine as she needs it.  She can stop her aspirin from my standpoint.  We will see her back in 4 weeks after course of outpatient therapy.  All questions and concerns were answered and addressed.

## 2023-03-20 ENCOUNTER — Other Ambulatory Visit: Payer: Self-pay | Admitting: Nurse Practitioner

## 2023-03-20 DIAGNOSIS — I10 Essential (primary) hypertension: Secondary | ICD-10-CM

## 2023-03-24 ENCOUNTER — Encounter: Payer: Self-pay | Admitting: Physician Assistant

## 2023-03-24 ENCOUNTER — Ambulatory Visit (INDEPENDENT_AMBULATORY_CARE_PROVIDER_SITE_OTHER): Payer: Medicare HMO | Admitting: Physician Assistant

## 2023-03-24 DIAGNOSIS — Z96651 Presence of right artificial knee joint: Secondary | ICD-10-CM

## 2023-03-24 MED ORDER — OXYCODONE HCL 5 MG PO TABS: 5.0000 mg | ORAL_TABLET | Freq: Four times a day (QID) | ORAL | 0 refills | Status: DC | PRN

## 2023-03-24 MED ORDER — TIZANIDINE HCL 4 MG PO TABS: 4.0000 mg | ORAL_TABLET | Freq: Four times a day (QID) | ORAL | 0 refills | Status: DC | PRN

## 2023-03-24 NOTE — Progress Notes (Signed)
HPI: Jill Banks returns today status post right total knee arthroplasty 02/14/2023.  States that overall she is having 7 out of 10 pain this is mostly at night.  She is being having home therapy work with her that is going to continue this for her.  Rest of this week.  Then start outpatient physical therapy next week.  She is using a cane to ambulate.  She is using oxycodone and muscle relaxant for pain.  She has had no new injuries no fevers chills.  Physical exam: General well-developed well-nourished female no acute distress walks with a slight antalgic gait and use of a cane. Right knee surgical incisions healing well no signs of infection.  Full extension flexion to 90 degrees.  Calf supple nontender.  Impression: Status post right total knee arthroplasty 02/14/2028  Plan: Will see him back in just 2 weeks to see what type of range of motion she has particularly flexion.  Refilled her oxycodone and her muscle relaxant today.  Questions were encouraged and answered at length.

## 2023-04-10 ENCOUNTER — Ambulatory Visit (INDEPENDENT_AMBULATORY_CARE_PROVIDER_SITE_OTHER): Payer: Medicare HMO | Admitting: Orthopaedic Surgery

## 2023-04-10 ENCOUNTER — Encounter: Payer: Self-pay | Admitting: Orthopaedic Surgery

## 2023-04-10 ENCOUNTER — Other Ambulatory Visit: Payer: Self-pay | Admitting: Nurse Practitioner

## 2023-04-10 DIAGNOSIS — E559 Vitamin D deficiency, unspecified: Secondary | ICD-10-CM

## 2023-04-10 DIAGNOSIS — Z96651 Presence of right artificial knee joint: Secondary | ICD-10-CM

## 2023-04-10 NOTE — Progress Notes (Signed)
The patient is now getting close to 8 weeks status post a right total knee arthroplasty.  She said that she only has pain at night and is doing great.  She is 67.  She is driving.  She has been going to outpatient physical therapy but they feel like they can likely transition her to a home program.  Her right knee has full extension.  I can flex her past 90 degrees.  It feels ligamentously stable.  She is pleased overall as.  From my standpoint we will see her back in 3 months.  I would like a standing AP and lateral of her right operative knee at that visit.

## 2023-05-12 ENCOUNTER — Other Ambulatory Visit: Payer: Self-pay | Admitting: Nurse Practitioner

## 2023-05-12 ENCOUNTER — Inpatient Hospital Stay: Payer: Medicare HMO | Attending: Oncology

## 2023-05-12 DIAGNOSIS — D696 Thrombocytopenia, unspecified: Secondary | ICD-10-CM | POA: Insufficient documentation

## 2023-05-12 DIAGNOSIS — I10 Essential (primary) hypertension: Secondary | ICD-10-CM

## 2023-05-12 LAB — CBC WITH DIFFERENTIAL (CANCER CENTER ONLY)
Abs Immature Granulocytes: 0.02 10*3/uL (ref 0.00–0.07)
Basophils Absolute: 0 10*3/uL (ref 0.0–0.1)
Basophils Relative: 1 %
Eosinophils Absolute: 0.4 10*3/uL (ref 0.0–0.5)
Eosinophils Relative: 5 %
HCT: 40.4 % (ref 36.0–46.0)
Hemoglobin: 13 g/dL (ref 12.0–15.0)
Immature Granulocytes: 0 %
Lymphocytes Relative: 29 %
Lymphs Abs: 2.3 10*3/uL (ref 0.7–4.0)
MCH: 27 pg (ref 26.0–34.0)
MCHC: 32.2 g/dL (ref 30.0–36.0)
MCV: 84 fL (ref 80.0–100.0)
Monocytes Absolute: 0.8 10*3/uL (ref 0.1–1.0)
Monocytes Relative: 10 %
Neutro Abs: 4.4 10*3/uL (ref 1.7–7.7)
Neutrophils Relative %: 55 %
Platelet Count: 123 10*3/uL — ABNORMAL LOW (ref 150–400)
RBC: 4.81 MIL/uL (ref 3.87–5.11)
RDW: 15.3 % (ref 11.5–15.5)
Smear Review: NORMAL
WBC Count: 8 10*3/uL (ref 4.0–10.5)
nRBC: 0 % (ref 0.0–0.2)

## 2023-05-12 LAB — VITAMIN B12: Vitamin B-12: 391 pg/mL (ref 180–914)

## 2023-05-12 LAB — TECHNOLOGIST SMEAR REVIEW: Plt Morphology: NORMAL

## 2023-05-12 MED ORDER — VITAMIN B-12 1000 MCG PO TABS: 1000.0000 ug | ORAL_TABLET | Freq: Every day | ORAL | 1 refills | Status: DC

## 2023-05-15 ENCOUNTER — Inpatient Hospital Stay (HOSPITAL_BASED_OUTPATIENT_CLINIC_OR_DEPARTMENT_OTHER): Payer: Medicare HMO | Admitting: Oncology

## 2023-05-15 ENCOUNTER — Encounter: Payer: Self-pay | Admitting: Oncology

## 2023-05-15 VITALS — BP 155/76 | HR 66 | Temp 97.7°F | Resp 18 | Wt 254.4 lb

## 2023-05-15 DIAGNOSIS — D696 Thrombocytopenia, unspecified: Secondary | ICD-10-CM

## 2023-05-15 NOTE — Assessment & Plan Note (Addendum)
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  Slightly increased light chain ratio, nonspecific. Negative 24 hour UPEP Recommend observation.  empiric vitamin B12 1000 mcg daily. Check abdomen US

## 2023-05-15 NOTE — Progress Notes (Signed)
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  Slightly increased light chain ratio, nonspecific. Negative 24 hour UPEP Recommend observation.  empiric vitamin B12 1000 mcg daily. Check abdomen US    Orders Placed This Encounter  Procedures   US Abdomen Complete    Standing Status:   Future    Standing Expiration Date:   05/14/2024    Order Specific Question:   Reason for Exam (SYMPTOM  OR DIAGNOSIS REQUIRED)    Answer:   thrombocytopenia    Order Specific Question:   Preferred imaging location?    Answer:   [facility]   CBC with Differential (Cancer Center Only)    Standing Status:   Future    Standing Expiration Date:   05/14/2024   Immature Platelet Fraction    Standing Status:   Future    Standing Expiration Date:   05/14/2024   Follow up 6 months All questions were answered. The patient knows to call the clinic with any problems, questions or concerns.  Rickard Patience, MD, PhD [facility] Hematology Oncology 05/15/2023    HISTORY OF PRESENTING ILLNESS:  Jill Banks is a 67 y.o. female who was seen in consultation at the request of Sallyanne Kuster, NP for evaluation of  Thrombocytopenia  Reviewed patient's previous labs. Thrombocytopenia is chronic chronic onset , since at least 2020. Denies weight loss, fever, chills, fatigue, night sweats.  Denies hematochezia, hematuria, hematemesis, epistaxis, black tarry stool.  Patient has no easy bruising.    Denies history hepatitis or HIV infection Denies history of chronic liver disease Denies routine alcohol consumption. Denies dietary restrictions.     INTERVAL HISTORY Jill Banks is a 67 y.o. female who has above history reviewed by me today presents for follow up visit for thrombocytopenia patient presents to discuss results.  She has no new complaints. + intentional weight loss  MEDICAL HISTORY:  Past Medical History:  Diagnosis Date   Anxiety    Arthritis    Blood dyscrasia    Thrombocytopenia   Bruises easily    COVID-19 04/2019   Diabetes mellitus without complication (HCC)    Headache    HLD (hyperlipidemia)    Hypertension    Leg pain    Pneumonia    about 5 yrs ago   Sleep apnea    no CPAP    SURGICAL HISTORY: Past Surgical History:  Procedure Laterality Date   APPLICATION OF WOUND VAC 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   Procedure: APPLICATION OF WOUND VAC;  Surgeon: Kennedy Bucker, MD;  Location: [facility] ORS;  Service: Orthopedics;  Laterality: Left;  #[id number]   COLONOSCOPY WITH PROPOFOL N/A 09/25/2021   Procedure: COLONOSCOPY WITH PROPOFOL;  Surgeon: Toney Reil, MD;  Location: [facility] ENDOSCOPY;  Service: Gastroenterology;  Laterality: N/A;   DILATATION & CURETTAGE/HYSTEROSCOPY WITH MYOSURE  10/17/2020   Procedure: DILATATION & CURETTAGE/HYSTEROSCOPY WITH MYOSURE POLYPECTOMY;  Surgeon: Natale Milch, MD;  Location: [facility] ORS;  Service: Gynecology;;   DILATION AND CURETTAGE OF UTERUS     x 3   TOE SURGERY Left    second toe   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   Procedure: LEFT TOTAL KNEE ARTHROPLASTY;  Surgeon: Kennedy Bucker, MD;  Location: [facility] ORS;  Service: Orthopedics;  Laterality: Left;   TOTAL KNEE ARTHROPLASTY Right 02/14/2023   Procedure: RIGHT TOTAL KNEE ARTHROPLASTY;  Surgeon: Kathryne Hitch, MD;  Location: [facility] ORS;  Service: Orthopedics;  Laterality: Right;    SOCIAL HISTORY: Social History   Socioeconomic History   Marital status: Married    Spouse name: Not on file   Number of children: Not on file   Years of education: Not on file   Highest education level: Not on file   Occupational History   Not on file  Tobacco Use   Smoking status: Former    Current packs/day: 0.00    Average packs/day: 0.3 packs/day for 2.0 years (0.5 ttl pk-yrs)    Types: Cigarettes    Start date: 2007    Quit date: 2009    Years since quitting: 15.6   Smokeless tobacco: Never  Vaping Use   Vaping status: Never Used  Substance and Sexual Activity   Alcohol use: Not Currently    Comment: ocassionally   Drug use: No   Sexual activity: Yes  Other Topics Concern   Not on file  Social History Narrative   Not on file   Social Determinants of Health   Financial Resource Strain: Not on file  Food Insecurity: No Food Insecurity (02/14/2023)   Hunger Vital Sign    Worried About Running Out of Food in the Last Year: Never true    Ran Out of Food in the Last Year: Never true  Transportation Needs: No Transportation Needs (02/14/2023)   PRAPARE - Administrator, Civil Service (Medical): No    Lack of Transportation (Non-Medical): No  Physical Activity: Not on file  Stress: Not on file  Social Connections: Not on file  Intimate Partner Violence: Not At Risk (02/14/2023)   Humiliation, Afraid, Rape, and Kick questionnaire    Fear of Current or Ex-Partner: No    Emotionally Abused: No    Physically Abused: No    Sexually Abused: No    FAMILY HISTORY: Family History  Problem Relation Age of Onset   Hypertension Father    Stroke Father    Hypertension Brother    Stroke Brother    Hyperlipidemia Brother    Breast cancer Maternal Aunt    Asthma Son     ALLERGIES:  has No Known Allergies.  MEDICATIONS:  Current Outpatient Medications  Medication Sig Dispense Refill   Accu-Chek Softclix Lancets lancets Use as instructed to check blood sugars once daily 300 each 3   amLODipine (NORVASC) 5 MG tablet TAKE ONE TABLET BY MOUTH ONCE DAILY 30 tablet 2   bisoprolol (ZEBETA) 10 MG tablet TAKE ONE TABLET BY MOUTH ONCE DAILY 30 tablet 2   Blood Glucose Monitoring Suppl  (ONETOUCH VERIO FLEX SYSTEM) w/Device KIT      calcitRIOL (ROCALTROL) 0.25 MCG capsule TAKE (1) CAPSULE BY MOUTH ONCE DAILY. 90 capsule 5   chlorthalidone (HYGROTON) 25 MG tablet TAKE 1 TABLET BY MOUTH ONCE DAILY. 90 tablet 1   cyanocobalamin (VITAMIN B12) 1000 MCG tablet Take 1 tablet (1,000 mcg total) by mouth daily. 90 tablet 1   diclofenac sodium (VOLTAREN) 1 % GEL Apply 2 g topically 4 (four) times daily as needed (knee pain).     glucose blood test strip Use as instructed to check blood sugars once daily E11.65 300 each 3   naproxen sodium (ALEVE) 220 MG tablet Take 440 mg by mouth 2 (two) times daily as needed (pain).     NON FORMULARY cpap  device     oxyCODONE (OXY IR/ROXICODONE) 5 MG immediate release tablet Take 1-2 tablets (5-10 mg total) by mouth every 6 (six) hours as needed for moderate pain (pain score 4-6). 30 tablet 0   potassium chloride SA (KLOR-CON M) 20 MEQ tablet TAKE 1 TABLET BY MOUTH TWICE DAILY 180 tablet 11   tiZANidine (ZANAFLEX) 4 MG tablet Take 1 tablet (4 mg total) by mouth every 6 (six) hours as needed for muscle spasms. 30 tablet 0   No current facility-administered medications for this visit.    Review of Systems  Constitutional:  Negative for appetite change, chills, fatigue and fever.  HENT:   Negative for hearing loss and voice change.   Eyes:  Negative for eye problems.  Respiratory:  Negative for chest tightness and cough.   Cardiovascular:  Negative for chest pain.  Gastrointestinal:  Negative for abdominal distention, abdominal pain and blood in stool.  Endocrine: Negative for hot flashes.  Genitourinary:  Negative for difficulty urinating and frequency.   Musculoskeletal:  Negative for arthralgias.  Skin:  Negative for itching and rash.  Neurological:  Negative for extremity weakness.  Hematological:  Negative for adenopathy.  Psychiatric/Behavioral:  Negative for confusion.     PHYSICAL EXAMINATION:  Vitals:   05/15/23 1009  BP: (!) 155/76   Pulse: 66  Resp: 18  Temp: 97.7 F (36.5 C)  SpO2: 99%   Filed Weights   05/15/23 1009  Weight: 254 lb 6.4 oz (115.4 kg)    Physical Exam Constitutional:      General: She is not in acute distress. HENT:     Head: Normocephalic and atraumatic.  Eyes:     General: No scleral icterus. Cardiovascular:     Rate and Rhythm: Normal rate and regular rhythm.     Heart sounds: Normal heart sounds.  Pulmonary:     Effort: Pulmonary effort is normal. No respiratory distress.     Breath sounds: No wheezing.  Abdominal:     General: Bowel sounds are normal. There is no distension.     Palpations: Abdomen is soft.  Musculoskeletal:        General: No deformity. Normal range of motion.     Cervical back: Normal range of motion and neck supple.  Skin:    General: Skin is warm and dry.     Findings: No erythema or rash.  Neurological:     Mental Status: She is alert and oriented to person, place, and time. Mental status is at baseline.     Cranial Nerves: No cranial nerve deficit.     Coordination: Coordination normal.  Psychiatric:        Mood and Affect: Mood normal.      LABORATORY DATA:  I have reviewed the data as listed    Latest Ref Rng & Units 05/12/2023    1:23 PM 02/15/2023    3:40 AM 02/05/2023   11:44 AM  CBC  WBC 4.0 - 10.5 K/uL 8.0  12.0  8.6   Hemoglobin 12.0 - 15.0 g/dL 16.1  09.6  04.5   Hematocrit 36.0 - 46.0 % 40.4  38.3  44.3   Platelets 150 - 400 K/uL 123  113  133       Latest Ref Rng & Units 02/15/2023    3:40 AM 02/05/2023   11:44 AM 08/20/2022   11:56 AM  CMP  Glucose 70 - 99 mg/dL 409  811  914   BUN 8 - 23 mg/dL 16  18  12   Creatinine 0.44 - 1.00 mg/dL 1.61  0.96  0.45   Sodium 135 - 145 mmol/L 135  138  136   Potassium 3.5 - 5.1 mmol/L 3.3  3.3  3.6   Chloride 98 - 111 mmol/L 98  101  99   CO2 22 - 32 mmol/L 27  28  25    Calcium 8.9 - 10.3 mg/dL 8.5  9.5  9.6   Total Protein 6.5 - 8.1 g/dL  7.4  6.6   Total Bilirubin 0.3 - 1.2 mg/dL  0.7   0.5   Alkaline Phos 38 - 126 U/L  49  67   AST 15 - 41 U/L  19  18   ALT 0 - 44 U/L  15  15      RADIOGRAPHIC STUDIES: I have personally reviewed the radiological images as listed and agreed with the findings in the report.  No results found.

## 2023-05-21 ENCOUNTER — Ambulatory Visit
Admission: RE | Admit: 2023-05-21 | Discharge: 2023-05-21 | Disposition: A | Discharge status: Home / Self Care | Payer: Medicare HMO | Source: Ambulatory Visit | Attending: Oncology | Admitting: Oncology

## 2023-05-21 DIAGNOSIS — D696 Thrombocytopenia, unspecified: Secondary | ICD-10-CM | POA: Insufficient documentation

## 2023-05-26 ENCOUNTER — Ambulatory Visit: Payer: Medicare HMO | Admitting: Nurse Practitioner

## 2023-05-27 ENCOUNTER — Telehealth: Payer: Self-pay

## 2023-05-27 NOTE — Telephone Encounter (Signed)
Called pt, no answer. Detailed message left informing pt of MD recommendation. Will forward results to Dr. Verdis Prime office.   CB # provided for any questions.

## 2023-05-27 NOTE — Telephone Encounter (Signed)
-----   Message from Rickard Patience sent at 05/26/2023 10:45 PM EDT ----- US abdomen showed fatty liver disease, mild dilation of bile duct. Recommend her to see Dr. Allegra Lai for further evaluation.  Keep same appt

## 2023-05-29 ENCOUNTER — Telehealth: Payer: Self-pay

## 2023-05-29 NOTE — Telephone Encounter (Signed)
Patient is schedule for 07/30/2023

## 2023-05-29 NOTE — Telephone Encounter (Signed)
-----   Message from [facility] sent at 05/29/2023  8:12 AM EDT ----- Regarding: Clinic appt, new pt [name]  I can see her as new pt, next available is fine  RV ----- Message ----- From: Coralee Rud, RN Sent: 05/27/2023   2:54 PM EDT To: Toney Reil, MD  Hello Dr. Allegra Lai, pt's US abdomen showed fattly liver disease and mild dilation of bile duct. She would like for pt to follow up with you for further evaluation.   Thanks Lanora Manis

## 2023-06-17 ENCOUNTER — Encounter: Payer: Self-pay | Admitting: Nurse Practitioner

## 2023-06-17 ENCOUNTER — Ambulatory Visit: Payer: Medicare HMO | Admitting: Nurse Practitioner

## 2023-06-17 VITALS — BP 135/80 | HR 70 | Temp 98.1°F | Resp 16 | Ht 66.0 in | Wt 249.6 lb

## 2023-06-17 DIAGNOSIS — I700 Atherosclerosis of aorta: Secondary | ICD-10-CM

## 2023-06-17 DIAGNOSIS — R635 Abnormal weight gain: Secondary | ICD-10-CM

## 2023-06-17 DIAGNOSIS — K760 Fatty (change of) liver, not elsewhere classified: Secondary | ICD-10-CM

## 2023-06-17 DIAGNOSIS — E1165 Type 2 diabetes mellitus with hyperglycemia: Secondary | ICD-10-CM | POA: Diagnosis not present

## 2023-06-17 DIAGNOSIS — E1159 Type 2 diabetes mellitus with other circulatory complications: Secondary | ICD-10-CM | POA: Diagnosis not present

## 2023-06-17 DIAGNOSIS — I10 Essential (primary) hypertension: Secondary | ICD-10-CM

## 2023-06-17 DIAGNOSIS — I152 Hypertension secondary to endocrine disorders: Secondary | ICD-10-CM

## 2023-06-17 DIAGNOSIS — E1169 Type 2 diabetes mellitus with other specified complication: Secondary | ICD-10-CM | POA: Diagnosis not present

## 2023-06-17 LAB — POCT GLYCOSYLATED HEMOGLOBIN (HGB A1C): Hemoglobin A1C: 6.2 % — AB (ref 4.0–5.6)

## 2023-06-17 MED ORDER — AMLODIPINE BESYLATE 5 MG PO TABS
5.0000 mg | ORAL_TABLET | Freq: Every day | ORAL | 3 refills | Status: DC
Start: 2023-06-17 — End: 2024-03-26

## 2023-06-17 MED ORDER — RYBELSUS 7 MG PO TABS
7.0000 mg | ORAL_TABLET | Freq: Every day | ORAL | 4 refills | Status: DC
Start: 2023-06-17 — End: 2023-10-27

## 2023-06-17 MED ORDER — BISOPROLOL FUMARATE 10 MG PO TABS
10.0000 mg | ORAL_TABLET | Freq: Every day | ORAL | 3 refills | Status: DC
Start: 2023-06-17 — End: 2024-06-22

## 2023-06-17 MED ORDER — CHLORTHALIDONE 25 MG PO TABS
25.0000 mg | ORAL_TABLET | Freq: Every day | ORAL | 1 refills | Status: DC
Start: 2023-06-17 — End: 2023-12-23

## 2023-06-17 NOTE — Progress Notes (Signed)
[facility] [address] [place], [place] [address]  Internal MEDICINE  Office Visit Note  Patient Name: Jill Banks  [id number]  [id number]  Date of Service: 06/17/2023  Chief Complaint  Patient presents with   Diabetes   Hypertension   Hyperlipidemia   Follow-up    HPI Rus presents for a follow-up visit for fatty liver, diabetes and weight loss.  Fatty liver -- taking rybelsus, continuing diet modifications  Diabetes -- A1c improved to 6.2. on 7 mg rybelsus daily.  Weight loss -- lost 5 lbs on rybelsus and discussed walking daily.     Current Medication: Outpatient Encounter Medications as of 06/17/2023  Medication Sig   Semaglutide (RYBELSUS) 7 MG TABS Take 1 tablet (7 mg total) by mouth daily. (Patient not taking: Reported on 07/30/2023)   Accu-Chek Softclix Lancets lancets Use as instructed to check blood sugars once daily   amLODipine (NORVASC) 5 MG tablet Take 1 tablet (5 mg total) by mouth daily.   bisoprolol (ZEBETA) 10 MG tablet Take 1 tablet (10 mg total) by mouth daily.   Blood Glucose Monitoring Suppl (ONETOUCH VERIO FLEX SYSTEM) w/Device KIT    calcitRIOL (ROCALTROL) 0.25 MCG capsule TAKE (1) CAPSULE BY MOUTH ONCE DAILY.   chlorthalidone (HYGROTON) 25 MG tablet Take 1 tablet (25 mg total) by mouth daily.   cyanocobalamin (VITAMIN B12) 1000 MCG tablet Take 1 tablet (1,000 mcg total) by mouth daily.   diclofenac sodium (VOLTAREN) 1 % GEL Apply 2 g topically 4 (four) times daily as needed (knee pain).   glucose blood test strip Use as instructed to check blood sugars once daily E11.65   naproxen sodium (ALEVE) 220 MG tablet Take 440 mg by mouth 2 (two) times daily as needed (pain).   NON FORMULARY cpap device   potassium chloride SA (KLOR-CON M) 20 MEQ tablet TAKE 1 TABLET BY MOUTH TWICE DAILY   [DISCONTINUED] amLODipine (NORVASC) 5 MG tablet TAKE ONE TABLET BY MOUTH ONCE DAILY   [DISCONTINUED] bisoprolol (ZEBETA) 10 MG tablet TAKE ONE TABLET BY  MOUTH ONCE DAILY   [DISCONTINUED] chlorthalidone (HYGROTON) 25 MG tablet TAKE 1 TABLET BY MOUTH ONCE DAILY.   [DISCONTINUED] oxyCODONE (OXY IR/ROXICODONE) 5 MG immediate release tablet Take 1-2 tablets (5-10 mg total) by mouth every 6 (six) hours as needed for moderate pain (pain score 4-6).   [DISCONTINUED] tiZANidine (ZANAFLEX) 4 MG tablet Take 1 tablet (4 mg total) by mouth every 6 (six) hours as needed for muscle spasms.   No facility-administered encounter medications on file as of 06/17/2023.    Surgical History: Past Surgical History:  Procedure Laterality Date   APPLICATION OF WOUND VAC 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   Procedure: APPLICATION OF WOUND VAC;  Surgeon: Kennedy Bucker, MD;  Location: [facility] ORS;  Service: Orthopedics;  Laterality: Left;  #[id number]   COLONOSCOPY WITH PROPOFOL N/A 09/25/2021   Procedure: COLONOSCOPY WITH PROPOFOL;  Surgeon: Toney Reil, MD;  Location: [facility] ENDOSCOPY;  Service: Gastroenterology;  Laterality: N/A;   DILATATION & CURETTAGE/HYSTEROSCOPY WITH MYOSURE  10/17/2020   Procedure: DILATATION & CURETTAGE/HYSTEROSCOPY WITH MYOSURE POLYPECTOMY;  Surgeon: Natale Milch, MD;  Location: [facility] ORS;  Service: Gynecology;;   DILATION AND CURETTAGE OF UTERUS     x 3   TOE SURGERY Left    second toe   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   Procedure: LEFT TOTAL KNEE ARTHROPLASTY;  Surgeon: Kennedy Bucker, MD;  Location: [facility] ORS;  Service: Orthopedics;  Laterality: Left;   TOTAL KNEE ARTHROPLASTY Right  02/14/2023   Procedure: RIGHT TOTAL KNEE ARTHROPLASTY;  Surgeon: Kathryne Hitch, MD;  Location: [facility] ORS;  Service: Orthopedics;  Laterality: Right;    Medical History: Past Medical History:  Diagnosis Date   Anxiety    Arthritis    Blood dyscrasia    Thrombocytopenia   Bruises easily    COVID-19 04/2019   Diabetes mellitus without complication (HCC)    Headache    HLD (hyperlipidemia)    Hypertension    Leg pain    Pneumonia    about 5 yrs ago    Sleep apnea    no CPAP    Family History: Family History  Problem Relation Age of Onset   Hypertension Father    Stroke Father    Hypertension Brother    Stroke Brother    Hyperlipidemia Brother    Breast cancer Maternal Aunt    Asthma Son     Social History   Socioeconomic History   Marital status: Married    Spouse name: Not on file   Number of children: Not on file   Years of education: Not on file   Highest education level: Not on file  Occupational History   Not on file  Tobacco Use   Smoking status: Former    Current packs/day: 0.00    Average packs/day: 0.3 packs/day for 2.0 years (0.5 ttl pk-yrs)    Types: Cigarettes    Start date: 2007    Quit date: 2009    Years since quitting: 15.8   Smokeless tobacco: Never  Vaping Use   Vaping status: Never Used  Substance and Sexual Activity   Alcohol use: Not Currently    Comment: ocassionally   Drug use: No   Sexual activity: Yes  Other Topics Concern   Not on file  Social History Narrative   Not on file   Social Determinants of Health   Financial Resource Strain: Not on file  Food Insecurity: No Food Insecurity (02/14/2023)   Hunger Vital Sign    Worried About Running Out of Food in the Last Year: Never true    Ran Out of Food in the Last Year: Never true  Transportation Needs: No Transportation Needs (02/14/2023)   PRAPARE - Administrator, Civil Service (Medical): No    Lack of Transportation (Non-Medical): No  Physical Activity: Not on file  Stress: Not on file  Social Connections: Not on file  Intimate Partner Violence: Not At Risk (02/14/2023)   Humiliation, Afraid, Rape, and Kick questionnaire    Fear of Current or Ex-Partner: No    Emotionally Abused: No    Physically Abused: No    Sexually Abused: No      Review of Systems  Constitutional:  Negative for chills, fatigue and unexpected weight change.  HENT:  Negative for congestion, rhinorrhea, sneezing and sore throat.    Eyes:  Negative for redness.  Respiratory: Negative.  Negative for cough, chest tightness, shortness of breath and wheezing.   Cardiovascular: Negative.  Negative for chest pain and palpitations.  Gastrointestinal:  Negative for abdominal pain, constipation, diarrhea, nausea and vomiting.  Genitourinary:  Negative for dysuria and frequency.  Musculoskeletal:  Negative for arthralgias, back pain, joint swelling and neck pain.  Skin:  Negative for rash.  Neurological: Negative.  Negative for tremors and numbness.  Hematological:  Negative for adenopathy. Does not bruise/bleed easily.  Psychiatric/Behavioral:  Negative for behavioral problems (Depression), sleep disturbance and suicidal ideas. The patient is not nervous/anxious.  Vital Signs: BP 135/80 Comment: 156/88  Pulse 70   Temp 98.1 F (36.7 C)   Resp 16   Ht 5\' 6"  (1.676 m)   Wt 249 lb 9.6 oz (113.2 kg)   SpO2 93%   BMI 40.29 kg/m    Physical Exam Vitals reviewed.  Constitutional:      General: She is not in acute distress.    Appearance: Normal appearance. She is obese. She is not ill-appearing.  HENT:     Head: Normocephalic and atraumatic.  Eyes:     Pupils: Pupils are equal, round, and reactive to light.  Cardiovascular:     Rate and Rhythm: Normal rate and regular rhythm.  Pulmonary:     Effort: Pulmonary effort is normal. No respiratory distress.  Neurological:     Mental Status: She is alert and oriented to person, place, and time.  Psychiatric:        Mood and Affect: Mood normal.        Behavior: Behavior normal.        Assessment/Plan: 1. Type 2 diabetes mellitus with other specified complication, without long-term current use of insulin (HCC) A1c continues to improve, continue rybelsus as prescribed. Follow up in 4 months  - POCT glycosylated hemoglobin (Hb A1C) - Semaglutide (RYBELSUS) 7 MG TABS; Take 1 tablet (7 mg total) by mouth daily. (Patient not taking: Reported on 07/30/2023)   Dispense: 90 tablet; Refill: 4  2. Hypertension associated with diabetes (HCC) Stable, continue amlodipine, chlorthalidone and bisoprolol as prescribed.  - amLODipine (NORVASC) 5 MG tablet; Take 1 tablet (5 mg total) by mouth daily.  Dispense: 90 tablet; Refill: 3 - chlorthalidone (HYGROTON) 25 MG tablet; Take 1 tablet (25 mg total) by mouth daily.  Dispense: 90 tablet; Refill: 1 - bisoprolol (ZEBETA) 10 MG tablet; Take 1 tablet (10 mg total) by mouth daily.  Dispense: 90 tablet; Refill: 3  3. NAFLD (nonalcoholic fatty liver disease) Currently taking rybelsus, continue as prescribed.   4. Abnormal weight gain Continue rybelsus as prescribed.     General Counseling: ingrida getting understanding of the findings of todays visit and agrees with plan of treatment. I have discussed any further diagnostic evaluation that may be needed or ordered today. We also reviewed her medications today. she has been encouraged to call the office with any questions or concerns that should arise related to todays visit.    Orders Placed This Encounter  Procedures   POCT glycosylated hemoglobin (Hb A1C)    Meds ordered this encounter  Medications   Semaglutide (RYBELSUS) 7 MG TABS    Sig: Take 1 tablet (7 mg total) by mouth daily.    Dispense:  90 tablet    Refill:  4    Dx code E11.65   amLODipine (NORVASC) 5 MG tablet    Sig: Take 1 tablet (5 mg total) by mouth daily.    Dispense:  90 tablet    Refill:  3    This prescription was filled on 02/28/2023. Any refills authorized will be placed on file.   chlorthalidone (HYGROTON) 25 MG tablet    Sig: Take 1 tablet (25 mg total) by mouth daily.    Dispense:  90 tablet    Refill:  1    This prescription was filled on 07/15/2022. Any refills authorized will be placed on file.   bisoprolol (ZEBETA) 10 MG tablet    Sig: Take 1 tablet (10 mg total) by mouth daily.    Dispense:  90 tablet  Refill:  3    Return for upcoming appt in december.  .   Total time spent:30 Minutes Time spent includes review of chart, medications, test results, and follow up plan with the patient.   [place] Controlled Substance Database was reviewed by me.  This patient was seen by Sallyanne Kuster, FNP-C in collaboration with Dr. Beverely Risen as a part of collaborative care agreement.   Carianne Taira R. Tedd Sias, MSN, FNP-C Internal medicine

## 2023-07-14 ENCOUNTER — Ambulatory Visit: Payer: Medicare HMO | Admitting: Orthopaedic Surgery

## 2023-07-14 ENCOUNTER — Encounter: Payer: Self-pay | Admitting: Orthopaedic Surgery

## 2023-07-14 ENCOUNTER — Other Ambulatory Visit (INDEPENDENT_AMBULATORY_CARE_PROVIDER_SITE_OTHER): Payer: Self-pay

## 2023-07-14 DIAGNOSIS — Z96651 Presence of right artificial knee joint: Secondary | ICD-10-CM

## 2023-07-14 NOTE — Progress Notes (Signed)
We are right at around 5 months status post the patient having a right total knee arthroplasty.  She does have a remote history of a left knee replacement done in 2020 by another surgeon.  Thus far she denies any significant issues as a relates to her more recent right knee replacement that was done in May of this year.  She denies any issues with either knee.  On exam both her knees move smoothly and fluidly.  There is no effusion of either knee.  Both knees are ligamentously stable.  An AP and lateral of the more recent right knee shows a well-seated press-fit total knee arthroplasty with no complicating features and no evidence of loosening.  From my standpoint we will see her back 1 more time for her right knee in 6 months unless there are issues.  Since is a press-fit implant will have 1 more x-ray in 6 months of her right knee.

## 2023-07-30 ENCOUNTER — Ambulatory Visit: Payer: Medicare HMO | Admitting: Gastroenterology

## 2023-07-30 ENCOUNTER — Encounter: Payer: Self-pay | Admitting: Gastroenterology

## 2023-07-30 VITALS — BP 166/84 | HR 67 | Temp 98.9°F | Ht 66.0 in | Wt 250.4 lb

## 2023-07-30 DIAGNOSIS — K760 Fatty (change of) liver, not elsewhere classified: Secondary | ICD-10-CM | POA: Diagnosis not present

## 2023-07-30 DIAGNOSIS — K838 Other specified diseases of biliary tract: Secondary | ICD-10-CM

## 2023-07-30 NOTE — Progress Notes (Unsigned)
Arlyss Repress, MD [address]  [address]  [place], [place] [address]  Main: [phone]  Fax: [phone]    Gastroenterology Consultation  Referring Provider:     Sallyanne Kuster, NP Primary Care Physician:  Sallyanne Kuster, NP Primary Gastroenterologist:  Dr. Arlyss Repress Reason for Consultation: Hepatic steatosis, dilated CBD        HPI:   Jill Banks is a 67 y.o. female referred by Sallyanne Kuster, NP  for consultation & management of hepatic steatosis.  Patient is referred by Dr. Cathie Hoops because of fatty liver disease and mildly dilated bile duct based on the ultrasound from 05/25/2023.  Patient always had normal LFTs.  She does not have any GI concerns today. 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 Resected and retrieved. - Severe diverticulosis in the entire examined colon. There was no evidence of diverticular bleeding. - The distal rectum and anal verge are normal on retroflexion view. - The examination was otherwise normal. DIAGNOSIS:  A. COLON POLYP, CECUM; COLD BIOPSY:  - TUBULAR ADENOMA.  - NEGATIVE FOR HIGH-GRADE DYSPLASIA AND MALIGNANCY.   Past Medical History:  Diagnosis Date   Anxiety    Arthritis    Blood dyscrasia    Thrombocytopenia   Bruises easily    COVID-19 04/2019   Diabetes mellitus without complication (HCC)    Headache    HLD (hyperlipidemia)    Hypertension    Leg pain    Pneumonia    about 5 yrs ago   Sleep apnea    no CPAP    Past Surgical History:  Procedure Laterality Date   APPLICATION OF WOUND VAC 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   Procedure: APPLICATION OF WOUND VAC;  Surgeon: Kennedy Bucker, MD;  Location: [facility] ORS;  Service: Orthopedics;   Laterality: Left;  #[id number]   COLONOSCOPY WITH PROPOFOL N/A 09/25/2021   Procedure: COLONOSCOPY WITH PROPOFOL;  Surgeon: Toney Reil, MD;  Location[facility] ENDOSCOPY;  Service: Gastroenterology;  Laterality: N/A;   DILATATION & CURETTAGE/HYSTEROSCOPY WITH MYOSURE  10/17/2020   Procedure: DILATATION & CURETTAGE/HYSTEROSCOPY WITH MYOSURE POLYPECTOMY;  Surgeon: Natale Milch, MD;  Location: [facility] ORS;  Service: Gynecology;;   DILATION AND CURETTAGE OF UTERUS     x 3   TOE SURGERY Left    second toe   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   Procedure: LEFT TOTAL KNEE ARTHROPLASTY;  Surgeon: Kennedy Bucker, MD;  Location: [facility] ORS;  Service: Orthopedics;  Laterality: Left;   TOTAL KNEE ARTHROPLASTY Right 02/14/2023   Procedure: RIGHT TOTAL KNEE ARTHROPLASTY;  Surgeon: Kathryne Hitch, MD;  Location: [facility];  Service: Orthopedics;  Laterality: Right;     Current Outpatient Medications:    Accu-Chek Softclix Lancets lancets, Use as instructed to check blood sugars once daily, Disp: 300 each, Rfl: 3   amLODipine (NORVASC) 5 MG tablet, Take 1 tablet (5 mg total) by mouth daily., Disp: 90 tablet, Rfl: 3   bisoprolol (ZEBETA) 10 MG tablet, Take 1 tablet (10 mg total) by mouth daily., Disp: 90 tablet, Rfl: 3   Blood Glucose Monitoring Suppl (ONETOUCH VERIO FLEX SYSTEM) w/Device KIT, , Disp: , Rfl:    calcitRIOL (ROCALTROL) 0.25 MCG capsule, TAKE (1) CAPSULE BY MOUTH ONCE  DAILY., Disp: 90 capsule, Rfl: 5   chlorthalidone (HYGROTON) 25 MG tablet, Take 1 tablet (25 mg total) by mouth daily., Disp: 90 tablet, Rfl: 1   cyanocobalamin (VITAMIN B12) 1000 MCG tablet, Take 1 tablet (1,000 mcg total) by mouth daily., Disp: 90 tablet, Rfl: 1   diclofenac sodium (VOLTAREN) 1 % GEL, Apply 2 g topically 4 (four) times daily as needed (knee pain)., Disp: , Rfl:    glucose blood test strip, Use as instructed to check blood sugars once daily E11.65, Disp: 300 each, Rfl: 3   naproxen sodium (ALEVE)  220 MG tablet, Take 440 mg by mouth 2 (two) times daily as needed (pain)., Disp: , Rfl:    NON FORMULARY, cpap device, Disp: , Rfl:    potassium chloride SA (KLOR-CON M) 20 MEQ tablet, TAKE 1 TABLET BY MOUTH TWICE DAILY, Disp: 180 tablet, Rfl: 11   Semaglutide (RYBELSUS) 7 MG TABS, Take 1 tablet (7 mg total) by mouth daily. (Patient not taking: Reported on 07/30/2023), Disp: 90 tablet, Rfl: 4   Family History  Problem Relation Age of Onset   Hypertension Father    Stroke Father    Hypertension Brother    Stroke Brother    Hyperlipidemia Brother    Breast cancer Maternal Aunt    Asthma Son      Social History   Tobacco Use   Smoking status: Former    Current packs/day: 0.00    Average packs/day: 0.3 packs/day for 2.0 years (0.5 ttl pk-yrs)    Types: Cigarettes    Start date: 2007    Quit date: 2009    Years since quitting: 15.8   Smokeless tobacco: Never  Vaping Use   Vaping status: Never Used  Substance Use Topics   Alcohol use: Not Currently    Comment: ocassionally   Drug use: No    Allergies as of 07/30/2023   (No Known Allergies)    Review of Systems:    All systems reviewed and negative except where noted in HPI.   Physical Exam:  BP (!) 166/84 (BP Location: Left Arm, Patient Position: Sitting, Cuff Size: Large)   Pulse 67   Temp 98.9 F (37.2 C) (Oral)   Ht 5\' 6"  (1.676 m)   Wt 250 lb 6 oz (113.6 kg)   BMI 40.41 kg/m  No LMP recorded. Patient is postmenopausal.  General:   Alert,  Well-developed, well-nourished, pleasant and cooperative in NAD Head:  Normocephalic and atraumatic. Eyes:  Sclera clear, no icterus.   Conjunctiva pink. Ears:  Normal auditory acuity. Nose:  No deformity, discharge, or lesions. Mouth:  No deformity or lesions,oropharynx pink & moist. Neck:  Supple; no masses or thyromegaly. Lungs:  Respirations even and unlabored.  Clear throughout to auscultation.   No wheezes, crackles, or rhonchi. No acute distress. Heart:  Regular  rate and rhythm; no murmurs, clicks, rubs, or gallops. Abdomen:  Normal bowel sounds. Soft, non-tender and non-distended without masses, hepatosplenomegaly or hernias noted.  No guarding or rebound tenderness.   Rectal: Not performed Msk:  Symmetrical without gross deformities. Good, equal movement & strength bilaterally. Pulses:  Normal pulses noted. Extremities:  No clubbing or edema.  No cyanosis. Neurologic:  Alert and oriented x3;  grossly normal neurologically. Skin:  Intact without significant lesions or rashes. No jaundice. Psych:  Alert and cooperative. Normal mood and affect.  Imaging Studies: Reviewed  Assessment and Plan:   GEZELLE ARAB is a 67 y.o. pleasant female with morbid obesity is  seen in consultation for hepatic steatosis. Hepatic steatosis LFTs have been normal Counseled about weight loss management by following healthy lifestyle Dilated CBD, asymptomatic LFTs have been normal, not concerning for biliary obstruction, therefore do not recommend any further workup at this time Provided reassurance  Follow up as needed   Arlyss Repress, MD

## 2023-08-07 DIAGNOSIS — K760 Fatty (change of) liver, not elsewhere classified: Secondary | ICD-10-CM | POA: Insufficient documentation

## 2023-08-07 DIAGNOSIS — R635 Abnormal weight gain: Secondary | ICD-10-CM | POA: Insufficient documentation

## 2023-08-07 DIAGNOSIS — E119 Type 2 diabetes mellitus without complications: Secondary | ICD-10-CM | POA: Insufficient documentation

## 2023-08-26 ENCOUNTER — Encounter: Payer: Self-pay | Admitting: Nurse Practitioner

## 2023-08-26 ENCOUNTER — Ambulatory Visit (INDEPENDENT_AMBULATORY_CARE_PROVIDER_SITE_OTHER): Payer: Medicare HMO | Admitting: Nurse Practitioner

## 2023-08-26 VITALS — BP 135/85 | HR 76 | Temp 98.4°F | Resp 16 | Ht 66.0 in | Wt 248.8 lb

## 2023-08-26 DIAGNOSIS — Z0000 Encounter for general adult medical examination without abnormal findings: Secondary | ICD-10-CM | POA: Diagnosis not present

## 2023-08-26 DIAGNOSIS — E1169 Type 2 diabetes mellitus with other specified complication: Secondary | ICD-10-CM

## 2023-08-26 DIAGNOSIS — E1159 Type 2 diabetes mellitus with other circulatory complications: Secondary | ICD-10-CM

## 2023-08-26 DIAGNOSIS — I152 Hypertension secondary to endocrine disorders: Secondary | ICD-10-CM

## 2023-08-26 DIAGNOSIS — K760 Fatty (change of) liver, not elsewhere classified: Secondary | ICD-10-CM

## 2023-08-26 NOTE — Addendum Note (Signed)
Addended by: Norva Pavlov on: 08/26/2023 03:58 PM   Modules accepted: Orders

## 2023-08-26 NOTE — Progress Notes (Signed)
[facility] [address] [place], [place] [address]  Internal MEDICINE  Office Visit Note  Patient Name: Jill Banks  [id number]  [id number]  Date of Service: 08/26/2023  Chief Complaint  Patient presents with   Diabetes   Hypertension   Hyperlipidemia   Medicare Wellness    HPI Jill Banks presents for an annual well visit and physical exam.  Well-appearing 67 y.o. female with hypertension, aortic atherosclerosis, OSA, diabetes, and NAFLD Routine CRC screening: due in 2030 Routine mammogram: done this month at [facility]  DEXA scan: done in 2015 Eye exam: overdue  foot exam: done today  Labs: due for routine labs  New or worsening pain: none  Plans to get the covid booster soon at the pharmacy  Eye doctor in [place] or [place], overdue, has not been in a couple of years. Reminded to have this done.  Flat footed bilaterally, has an area of chronic and prominent swelling on the lateral right ankle but none on the left ankle      08/26/2023    9:00 AM 08/20/2022   10:56 AM 08/16/2021   10:18 AM  MMSE - Mini Mental State Exam  Orientation to time 5 5 5   Orientation to Place 5 5 5   Registration 3 3 3   Attention/ Calculation 5 5 5   Recall 3 3 3   Language- name 2 objects 2 2 2   Language- repeat 1 1 1   Language- follow 3 step command 3 3 3   Language- read & follow direction 1 1 1   Write a sentence 1 1 1   Copy design 1 1 1   Total score 30 30 30     Medicare Risk at Home - 08/26/23 0910     Any stairs in or around the home? Yes    If so, are there any without handrails? No    Home free of loose throw rugs in walkways, pet beds, electrical cords, etc? Yes    Adequate lighting in your home to reduce risk of falls? Yes    Life alert? No    Use of a cane, walker or w/c? No    Grab bars in the bathroom? Yes    Shower chair or bench in shower? No    Elevated toilet seat or a handicapped toilet? Yes              Functional Status Survey: Is the patient deaf  or have difficulty hearing?: No Does the patient have difficulty seeing, even when wearing glasses/contacts?: No Does the patient have difficulty concentrating, remembering, or making decisions?: No Does the patient have difficulty walking or climbing stairs?: No Does the patient have difficulty dressing or bathing?: No Does the patient have difficulty doing errands alone such as visiting a doctor's office or shopping?: No     09/25/2021   10:16 AM 11/19/2021   10:10 AM 02/18/2022   12:02 PM 08/20/2022   10:55 AM 08/26/2023    8:59 AM  Fall Risk  Falls in the past year?  0 0 0 0  Was there an injury with Fall?    0 0  Fall Risk Category Calculator    0 0  Fall Risk Category (Retired)    Low   (RETIRED) Patient Fall Risk Level Low fall risk Low fall risk Low fall risk Low fall risk   Patient at Risk for Falls Due to  No Fall Risks No Fall Risks No Fall Risks No Fall Risks  Fall risk Follow up  Falls  evaluation completed Falls evaluation completed Falls evaluation completed Falls evaluation completed       08/26/2023    8:59 AM  Depression screen PHQ 2/9  Decreased Interest 0  Down, Depressed, Hopeless 0  PHQ - 2 Score 0       Current Medication: Outpatient Encounter Medications as of 08/26/2023  Medication Sig   Accu-Chek Softclix Lancets lancets Use as instructed to check blood sugars once daily   amLODipine (NORVASC) 5 MG tablet Take 1 tablet (5 mg total) by mouth daily.   bisoprolol (ZEBETA) 10 MG tablet Take 1 tablet (10 mg total) by mouth daily.   Blood Glucose Monitoring Suppl (ONETOUCH VERIO FLEX SYSTEM) w/Device KIT    calcitRIOL (ROCALTROL) 0.25 MCG capsule TAKE (1) CAPSULE BY MOUTH ONCE DAILY.   chlorthalidone (HYGROTON) 25 MG tablet Take 1 tablet (25 mg total) by mouth daily.   cyanocobalamin (VITAMIN B12) 1000 MCG tablet Take 1 tablet (1,000 mcg total) by mouth daily.   diclofenac sodium (VOLTAREN) 1 % GEL Apply 2 g topically 4 (four) times daily as needed (knee  pain).   glucose blood test strip Use as instructed to check blood sugars once daily E11.65   naproxen sodium (ALEVE) 220 MG tablet Take 440 mg by mouth 2 (two) times daily as needed (pain).   NON FORMULARY cpap device   potassium chloride SA (KLOR-CON M) 20 MEQ tablet TAKE 1 TABLET BY MOUTH TWICE DAILY   Semaglutide (RYBELSUS) 7 MG TABS Take 1 tablet (7 mg total) by mouth daily.   No facility-administered encounter medications on file as of 08/26/2023.    Surgical History: Past Surgical History:  Procedure Laterality Date   APPLICATION OF WOUND VAC 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   Procedure: APPLICATION OF WOUND VAC;  Surgeon: Kennedy Bucker, MD;  Location: [facility] ORS;  Service: Orthopedics;  Laterality: Left;  #[id number]   COLONOSCOPY WITH PROPOFOL N/A 09/25/2021   Procedure: COLONOSCOPY WITH PROPOFOL;  Surgeon: Toney Reil, MD;  Location: [facility]OSCOPY;  Service: Gastroenterology;  Laterality: N/A;   DILATATION & CURETTAGE/HYSTEROSCOPY WITH MYOSURE  10/17/2020   Procedure: DILATATION & CURETTAGE/HYSTEROSCOPY WITH MYOSURE POLYPECTOMY;  Surgeon: Natale Milch, MD;  Location: [facility] ORS;  Service: Gynecology;;   DILATION AND CURETTAGE OF UTERUS     x 3   TOE SURGERY Left    second toe   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   Procedure: LEFT TOTAL KNEE ARTHROPLASTY;  Surgeon: Kennedy Bucker, MD;  Location: [facility] ORS;  Service: Orthopedics;  Laterality: Left;   TOTAL KNEE ARTHROPLASTY Right 02/14/2023   Procedure: RIGHT TOTAL KNEE ARTHROPLASTY;  Surgeon: Kathryne Hitch, MD;  Location: [facility] ORS;  Service: Orthopedics;  Laterality: Right;    Medical History: Past Medical History:  Diagnosis Date   Anxiety    Arthritis    Blood dyscrasia    Thrombocytopenia   Bruises easily    COVID-19 04/2019   Diabetes mellitus without complication (HCC)    Headache    HLD (hyperlipidemia)    Hypertension    Leg pain    Pneumonia    about 5 yrs ago   Sleep apnea    no CPAP    Family  History: Family History  Problem Relation Age of Onset   Hypertension Father    Stroke Father    Hypertension Brother    Stroke Brother    Hyperlipidemia Brother    Breast cancer Maternal Aunt    Asthma Son     Social History   Socioeconomic  History   Marital status: Married    Spouse name: Not on file   Number of children: Not on file   Years of education: Not on file   Highest education level: Not on file  Occupational History   Not on file  Tobacco Use   Smoking status: Former    Current packs/day: 0.00    Average packs/day: 0.3 packs/day for 2.0 years (0.5 ttl pk-yrs)    Types: Cigarettes    Start date: 2007    Quit date: 2009    Years since quitting: 15.9   Smokeless tobacco: Never  Vaping Use   Vaping status: Never Used  Substance and Sexual Activity   Alcohol use: Yes    Comment: ocassionally   Drug use: No   Sexual activity: Yes  Other Topics Concern   Not on file  Social History Narrative   Not on file   Social Determinants of Health   Financial Resource Strain: Not on file  Food Insecurity: No Food Insecurity (02/14/2023)   Hunger Vital Sign    Worried About Running Out of Food in the Last Year: Never true    Ran Out of Food in the Last Year: Never true  Transportation Needs: No Transportation Needs (02/14/2023)   PRAPARE - Administrator, Civil Service (Medical): No    Lack of Transportation (Non-Medical): No  Physical Activity: Not on file  Stress: Not on file  Social Connections: Not on file  Intimate Partner Violence: Not At Risk (02/14/2023)   Humiliation, Afraid, Rape, and Kick questionnaire    Fear of Current or Ex-Partner: No    Emotionally Abused: No    Physically Abused: No    Sexually Abused: No      Review of Systems  Constitutional:  Negative for activity change, appetite change, chills, fatigue, fever and unexpected weight change.  HENT: Negative.  Negative for congestion, ear pain, rhinorrhea, sore throat and  trouble swallowing.   Eyes: Negative.   Respiratory: Negative.  Negative for cough, chest tightness, shortness of breath and wheezing.   Cardiovascular: Negative.  Negative for chest pain.  Gastrointestinal: Negative.  Negative for abdominal pain, blood in stool, constipation, diarrhea, nausea and vomiting.  Endocrine: Negative.   Genitourinary: Negative.  Negative for difficulty urinating, dysuria, frequency, hematuria and urgency.  Musculoskeletal: Negative.  Negative for arthralgias, back pain, joint swelling, myalgias and neck pain.  Skin: Negative.  Negative for rash and wound.  Allergic/Immunologic: Negative.  Negative for immunocompromised state.  Neurological: Negative.  Negative for dizziness, seizures, numbness and headaches.  Hematological: Negative.   Psychiatric/Behavioral: Negative.  Negative for behavioral problems, self-injury and suicidal ideas. The patient is not nervous/anxious.     Vital Signs: BP 135/85 Comment: 148/92  Pulse 76   Temp 98.4 F (36.9 C)   Resp 16   Ht 5\' 6"  (1.676 m)   Wt 248 lb 12.8 oz (112.9 kg)   SpO2 94%   BMI 40.16 kg/m    Physical Exam Vitals reviewed.  Constitutional:      General: Jill Banks is awake. Jill Banks is not in acute distress.    Appearance: Normal appearance. Jill Banks is well-developed and well-groomed. Jill Banks is obese. Jill Banks is not ill-appearing or diaphoretic.  HENT:     Head: Normocephalic and atraumatic.     Mouth/Throat:     Lips: Pink.     Pharynx: Uvula midline.  Eyes:     General: Lids are normal. Vision grossly intact. Gaze aligned appropriately.  Pupils: Pupils are equal, round, and reactive to light.     Funduscopic exam:    Right eye: Red reflex present.        Left eye: Red reflex present. Neck:     Thyroid: No thyromegaly.     Vascular: No JVD.     Trachea: Trachea and phonation normal. No tracheal deviation.  Cardiovascular:     Rate and Rhythm: Normal rate and regular rhythm.     Pulses: Normal pulses.           Dorsalis pedis pulses are 2+ on the right side and 2+ on the left side.       Posterior tibial pulses are 2+ on the right side and 2+ on the left side.     Heart sounds: Normal heart sounds, S1 normal and S2 normal. No murmur heard.    No friction rub. No gallop.  Pulmonary:     Effort: Pulmonary effort is normal. No accessory muscle usage or respiratory distress.     Breath sounds: Normal breath sounds and air entry. No stridor. No wheezing or rales.  Chest:     Chest wall: No tenderness.     Comments: Declined clinical breast exam, gets yearly mamograms Abdominal:     Palpations: Abdomen is soft. There is no shifting dullness, fluid wave or pulsatile mass.  Musculoskeletal:     Right foot: Normal range of motion. No deformity, bunion, Charcot foot, foot drop or prominent metatarsal heads.     Left foot: Normal range of motion. No deformity, bunion, Charcot foot, foot drop or prominent metatarsal heads.  Feet:     Right foot:     Protective Sensation: 6 sites tested.  6 sites sensed.     Skin integrity: Skin integrity normal. No ulcer, blister, skin breakdown, erythema, warmth, callus, dry skin or fissure.     Toenail Condition: Right toenails are normal.     Left foot:     Protective Sensation: 6 sites tested.  6 sites sensed.     Skin integrity: Skin integrity normal. No ulcer, blister, skin breakdown, erythema, warmth, callus, dry skin or fissure.     Toenail Condition: Left toenails are normal.  Neurological:     Mental Status: Jill Banks is alert and oriented to person, place, and time.     Cranial Nerves: No cranial nerve deficit.     Motor: No abnormal muscle tone.     Coordination: Coordination normal.     Gait: Gait normal.     Deep Tendon Reflexes: Reflexes are normal and symmetric.  Psychiatric:        Mood and Affect: Mood normal.        Behavior: Behavior normal. Behavior is cooperative.        Thought Content: Thought content normal.        Judgment: Judgment normal.         Assessment/Plan: 1. Encounter for subsequent annual wellness visit (AWV) in Medicare patient Age-appropriate preventive screenings and vaccinations discussed. Routine labs for health maintenance will be ordered, requisition form given to patient. PHM updated.   2. Type 2 diabetes mellitus with other specified complication, without long-term current use of insulin (HCC) associated with hypertension Continue rybelsus as prescribed.   3. Hypertension associated with diabetes (HCC) Continue amlodipine, bisoprolol and chlorthalidone as prescribed.  4. NAFLD (nonalcoholic fatty liver disease) Continue rybelsus as prescribed.       General Counseling: akeylah lemond understanding of the findings of todays visit and agrees  with plan of treatment. I have discussed any further diagnostic evaluation that may be needed or ordered today. We also reviewed her medications today. Jill Banks has been encouraged to call the office with any questions or concerns that should arise related to todays visit.    No orders of the defined types were placed in this encounter.   No orders of the defined types were placed in this encounter.   Return in about 4 months (around 12/25/2023) for F/U, Recheck A1C, Spence Soberano PCP, and may schedule f/u for labs if abnormal.   Total time spent:30 Minutes Time spent includes review of chart, medications, test results, and follow up plan with the patient.   [place] Controlled Substance Database was reviewed by me.  This patient was seen by Sallyanne Kuster, FNP-C in collaboration with Dr. Beverely Risen as a part of collaborative care agreement.  Keeven Matty R. Tedd Sias, MSN, FNP-C Internal medicine

## 2023-08-27 LAB — MICROALBUMIN / CREATININE URINE RATIO
Creatinine, Urine: 116.4 mg/dL
Microalb/Creat Ratio: 8 mg/g{creat} (ref 0–29)
Microalbumin, Urine: 9.1 ug/mL

## 2023-08-29 ENCOUNTER — Other Ambulatory Visit: Payer: Self-pay | Admitting: Nurse Practitioner

## 2023-08-30 LAB — CBC WITH DIFFERENTIAL/PLATELET
Basophils Absolute: 0 10*3/uL (ref 0.0–0.2)
Basos: 0 %
EOS (ABSOLUTE): 0.3 10*3/uL (ref 0.0–0.4)
Eos: 5 %
Hematocrit: 43.7 % (ref 34.0–46.6)
Hemoglobin: 14.1 g/dL (ref 11.1–15.9)
Immature Grans (Abs): 0 10*3/uL (ref 0.0–0.1)
Immature Granulocytes: 0 %
Lymphocytes Absolute: 2.5 10*3/uL (ref 0.7–3.1)
Lymphs: 33 %
MCH: 27.8 pg (ref 26.6–33.0)
MCHC: 32.3 g/dL (ref 31.5–35.7)
MCV: 86 fL (ref 79–97)
Monocytes Absolute: 0.7 10*3/uL (ref 0.1–0.9)
Monocytes: 9 %
Neutrophils Absolute: 4 10*3/uL (ref 1.4–7.0)
Neutrophils: 53 %
Platelets: 121 10*3/uL — ABNORMAL LOW (ref 150–450)
RBC: 5.07 x10E6/uL (ref 3.77–5.28)
RDW: 15.2 % (ref 11.7–15.4)
WBC: 7.5 10*3/uL (ref 3.4–10.8)

## 2023-08-30 LAB — COMPREHENSIVE METABOLIC PANEL
ALT: 13 [IU]/L (ref 0–32)
AST: 21 [IU]/L (ref 0–40)
Albumin: 4.1 g/dL (ref 3.9–4.9)
Alkaline Phosphatase: 72 [IU]/L (ref 44–121)
BUN/Creatinine Ratio: 20 (ref 12–28)
BUN: 15 mg/dL (ref 8–27)
Bilirubin Total: 0.3 mg/dL (ref 0.0–1.2)
CO2: 25 mmol/L (ref 20–29)
Calcium: 9.6 mg/dL (ref 8.7–10.3)
Chloride: 101 mmol/L (ref 96–106)
Creatinine, Ser: 0.74 mg/dL (ref 0.57–1.00)
Globulin, Total: 2.6 g/dL (ref 1.5–4.5)
Glucose: 87 mg/dL (ref 70–99)
Potassium: 3.5 mmol/L (ref 3.5–5.2)
Sodium: 143 mmol/L (ref 134–144)
Total Protein: 6.7 g/dL (ref 6.0–8.5)
eGFR: 89 mL/min/{1.73_m2} (ref >59)

## 2023-08-30 LAB — LIPID PANEL
Chol/HDL Ratio: 3.5 {ratio} (ref 0.0–4.4)
Cholesterol, Total: 188 mg/dL (ref 100–199)
HDL: 54 mg/dL (ref >39)
LDL Chol Calc (NIH): 114 mg/dL — ABNORMAL HIGH (ref 0–99)
Triglycerides: 110 mg/dL (ref 0–149)
VLDL Cholesterol Cal: 20 mg/dL (ref 5–40)

## 2023-08-30 LAB — HGB A1C W/O EAG: Hgb A1c MFr Bld: 6.3 % — ABNORMAL HIGH (ref 4.8–5.6)

## 2023-09-30 ENCOUNTER — Telehealth: Payer: Self-pay

## 2023-10-01 ENCOUNTER — Other Ambulatory Visit: Payer: Self-pay

## 2023-10-01 ENCOUNTER — Inpatient Hospital Stay (HOSPITAL_COMMUNITY)
Admission: EM | Admit: 2023-10-01 | Discharge: 2023-10-04 | DRG: 398 | Disposition: A | Discharge status: Home / Self Care | Payer: Medicare HMO | Attending: General Surgery | Admitting: General Surgery

## 2023-10-01 ENCOUNTER — Emergency Department (HOSPITAL_COMMUNITY): Payer: Medicare HMO

## 2023-10-01 ENCOUNTER — Observation Stay (HOSPITAL_COMMUNITY): Payer: Medicare HMO | Admitting: Anesthesiology

## 2023-10-01 ENCOUNTER — Encounter (HOSPITAL_COMMUNITY): Payer: Self-pay | Admitting: *Deleted

## 2023-10-01 ENCOUNTER — Encounter (HOSPITAL_COMMUNITY)
Admission: EM | Disposition: A | Discharge status: Home / Self Care | Payer: Self-pay | Source: Home / Self Care | Attending: General Surgery

## 2023-10-01 DIAGNOSIS — Z87891 Personal history of nicotine dependence: Secondary | ICD-10-CM | POA: Diagnosis not present

## 2023-10-01 DIAGNOSIS — Z825 Family history of asthma and other chronic lower respiratory diseases: Secondary | ICD-10-CM

## 2023-10-01 DIAGNOSIS — K3533 Acute appendicitis with perforation and localized peritonitis, with abscess: Secondary | ICD-10-CM | POA: Diagnosis not present

## 2023-10-01 DIAGNOSIS — Z823 Family history of stroke: Secondary | ICD-10-CM

## 2023-10-01 DIAGNOSIS — I10 Essential (primary) hypertension: Secondary | ICD-10-CM | POA: Diagnosis present

## 2023-10-01 DIAGNOSIS — K3580 Unspecified acute appendicitis: Secondary | ICD-10-CM

## 2023-10-01 DIAGNOSIS — E876 Hypokalemia: Secondary | ICD-10-CM | POA: Diagnosis present

## 2023-10-01 DIAGNOSIS — G4733 Obstructive sleep apnea (adult) (pediatric): Secondary | ICD-10-CM | POA: Diagnosis present

## 2023-10-01 DIAGNOSIS — E119 Type 2 diabetes mellitus without complications: Secondary | ICD-10-CM | POA: Diagnosis present

## 2023-10-01 DIAGNOSIS — Z803 Family history of malignant neoplasm of breast: Secondary | ICD-10-CM

## 2023-10-01 DIAGNOSIS — K353 Acute appendicitis with localized peritonitis, without perforation or gangrene: Secondary | ICD-10-CM | POA: Diagnosis not present

## 2023-10-01 DIAGNOSIS — Z96653 Presence of artificial knee joint, bilateral: Secondary | ICD-10-CM | POA: Diagnosis present

## 2023-10-01 DIAGNOSIS — K3532 Acute appendicitis with perforation and localized peritonitis, without abscess: Secondary | ICD-10-CM

## 2023-10-01 DIAGNOSIS — K429 Umbilical hernia without obstruction or gangrene: Secondary | ICD-10-CM | POA: Diagnosis present

## 2023-10-01 DIAGNOSIS — D693 Immune thrombocytopenic purpura: Secondary | ICD-10-CM | POA: Diagnosis present

## 2023-10-01 DIAGNOSIS — E785 Hyperlipidemia, unspecified: Secondary | ICD-10-CM | POA: Diagnosis present

## 2023-10-01 DIAGNOSIS — Z8249 Family history of ischemic heart disease and other diseases of the circulatory system: Secondary | ICD-10-CM

## 2023-10-01 DIAGNOSIS — Z79899 Other long term (current) drug therapy: Secondary | ICD-10-CM

## 2023-10-01 DIAGNOSIS — Z8616 Personal history of COVID-19: Secondary | ICD-10-CM

## 2023-10-01 DIAGNOSIS — K760 Fatty (change of) liver, not elsewhere classified: Secondary | ICD-10-CM | POA: Diagnosis present

## 2023-10-01 DIAGNOSIS — Z83438 Family history of other disorder of lipoprotein metabolism and other lipidemia: Secondary | ICD-10-CM

## 2023-10-01 HISTORY — PX: XI ROBOTIC LAPAROSCOPIC ASSISTED APPENDECTOMY: SHX6877

## 2023-10-01 LAB — URINALYSIS, W/ REFLEX TO CULTURE (INFECTION SUSPECTED)
Bacteria, UA: NONE SEEN
Bilirubin Urine: NEGATIVE
Glucose, UA: NEGATIVE mg/dL
Ketones, ur: NEGATIVE mg/dL
Nitrite: NEGATIVE
Protein, ur: NEGATIVE mg/dL
Specific Gravity, Urine: 1.019 (ref 1.005–1.030)
pH: 5 (ref 5.0–8.0)

## 2023-10-01 LAB — MAGNESIUM: Magnesium: 1.6 mg/dL — ABNORMAL LOW (ref 1.7–2.4)

## 2023-10-01 LAB — CBC WITH DIFFERENTIAL/PLATELET
Abs Immature Granulocytes: 0 10*3/uL (ref 0.00–0.07)
Basophils Absolute: 0 10*3/uL (ref 0.0–0.1)
Basophils Relative: 0 %
Eosinophils Absolute: 0.2 10*3/uL (ref 0.0–0.5)
Eosinophils Relative: 1 %
HCT: 41.2 % (ref 36.0–46.0)
Hemoglobin: 13.8 g/dL (ref 12.0–15.0)
Lymphocytes Relative: 19 %
Lymphs Abs: 3 10*3/uL (ref 0.7–4.0)
MCH: 28 pg (ref 26.0–34.0)
MCHC: 33.5 g/dL (ref 30.0–36.0)
MCV: 83.7 fL (ref 80.0–100.0)
Monocytes Absolute: 0.8 10*3/uL (ref 0.1–1.0)
Monocytes Relative: 5 %
Neutro Abs: 11.8 10*3/uL — ABNORMAL HIGH (ref 1.7–7.7)
Neutrophils Relative %: 75 %
Platelets: 124 10*3/uL — ABNORMAL LOW (ref 150–400)
RBC: 4.92 MIL/uL (ref 3.87–5.11)
RDW: 15.2 % (ref 11.5–15.5)
WBC: 15.7 10*3/uL — ABNORMAL HIGH (ref 4.0–10.5)
nRBC: 0 % (ref 0.0–0.2)

## 2023-10-01 LAB — COMPREHENSIVE METABOLIC PANEL
ALT: 11 U/L (ref 0–44)
AST: 15 U/L (ref 15–41)
Albumin: 3.5 g/dL (ref 3.5–5.0)
Alkaline Phosphatase: 47 U/L (ref 38–126)
Anion gap: 9 (ref 5–15)
BUN: 12 mg/dL (ref 8–23)
CO2: 27 mmol/L (ref 22–32)
Calcium: 9.3 mg/dL (ref 8.9–10.3)
Chloride: 98 mmol/L (ref 98–111)
Creatinine, Ser: 0.74 mg/dL (ref 0.44–1.00)
GFR, Estimated: >60 mL/min (ref >60)
Glucose, Bld: 108 mg/dL — ABNORMAL HIGH (ref 70–99)
Potassium: 2.6 mmol/L — CL (ref 3.5–5.1)
Sodium: 134 mmol/L — ABNORMAL LOW (ref 135–145)
Total Bilirubin: 1.2 mg/dL (ref 0.0–1.2)
Total Protein: 7 g/dL (ref 6.5–8.1)

## 2023-10-01 LAB — LIPASE, BLOOD: Lipase: 24 U/L (ref 11–51)

## 2023-10-01 LAB — GLUCOSE, CAPILLARY
Glucose-Capillary: 88 mg/dL (ref 70–99)
Glucose-Capillary: 137 mg/dL — ABNORMAL HIGH (ref 70–99)
Glucose-Capillary: 124 mg/dL — ABNORMAL HIGH (ref 70–99)

## 2023-10-01 SURGERY — APPENDECTOMY, ROBOT-ASSISTED, LAPAROSCOPIC
Anesthesia: General | Site: Abdomen

## 2023-10-01 MED ORDER — ORAL CARE MOUTH RINSE: 15.0000 mL | Freq: Once | OROMUCOSAL | Status: DC

## 2023-10-01 MED ORDER — MORPHINE SULFATE (PF) 2 MG/ML IV SOLN
2.0000 mg | INTRAVENOUS | Status: DC | PRN
  Administered 2023-10-01: 2 mg via INTRAVENOUS
  Filled 2023-10-01: qty 1

## 2023-10-01 MED ORDER — POTASSIUM CHLORIDE 10 MEQ/100ML IV SOLN
10.0000 meq | INTRAVENOUS | Status: AC
  Administered 2023-10-01 (×2): 10 meq via INTRAVENOUS
  Filled 2023-10-01 (×2): qty 100

## 2023-10-01 MED ORDER — SODIUM CHLORIDE 0.9 % IV SOLN
2.0000 g | INTRAVENOUS | Status: AC
  Administered 2023-10-01: 2 g via INTRAVENOUS
  Filled 2023-10-01: qty 2

## 2023-10-01 MED ORDER — PROPOFOL 10 MG/ML IV BOLUS
INTRAVENOUS | Status: DC | PRN
  Administered 2023-10-01: 200 mg via INTRAVENOUS

## 2023-10-01 MED ORDER — STERILE WATER FOR IRRIGATION IR SOLN
Status: DC | PRN
  Administered 2023-10-01: 500 mL

## 2023-10-01 MED ORDER — CHLORHEXIDINE GLUCONATE CLOTH 2 % EX PADS: 6.0000 | MEDICATED_PAD | Freq: Once | CUTANEOUS | Status: DC

## 2023-10-01 MED ORDER — PHENYLEPHRINE 80 MCG/ML (10ML) SYRINGE FOR IV PUSH (FOR BLOOD PRESSURE SUPPORT)
PREFILLED_SYRINGE | INTRAVENOUS | Status: AC
Start: 2023-10-01 — End: ?
  Filled 2023-10-01: qty 10

## 2023-10-01 MED ORDER — ONDANSETRON HCL 4 MG/2ML IJ SOLN: 4.0000 mg | Freq: Four times a day (QID) | INTRAMUSCULAR | Status: DC | PRN

## 2023-10-01 MED ORDER — LACTATED RINGERS IV SOLN: INTRAVENOUS | Status: DC

## 2023-10-01 MED ORDER — OXYCODONE HCL 5 MG/5ML PO SOLN
5.0000 mg | Freq: Once | ORAL | Status: DC | PRN
Start: 2023-10-01 — End: 2023-10-01

## 2023-10-01 MED ORDER — SIMETHICONE 80 MG PO CHEW
40.0000 mg | CHEWABLE_TABLET | Freq: Four times a day (QID) | ORAL | Status: DC | PRN
  Administered 2023-10-02 (×2): 40 mg via ORAL
  Filled 2023-10-01 (×2): qty 1

## 2023-10-01 MED ORDER — DIPHENHYDRAMINE HCL 12.5 MG/5ML PO ELIX: 12.5000 mg | ORAL_SOLUTION | Freq: Four times a day (QID) | ORAL | Status: DC | PRN

## 2023-10-01 MED ORDER — CHLORHEXIDINE GLUCONATE 0.12 % MT SOLN
OROMUCOSAL | Status: AC
  Filled 2023-10-01: qty 15

## 2023-10-01 MED ORDER — HYDROMORPHONE HCL 1 MG/ML IJ SOLN
1.0000 mg | Freq: Once | INTRAMUSCULAR | Status: AC
  Administered 2023-10-01: 1 mg via INTRAVENOUS
  Filled 2023-10-01: qty 1

## 2023-10-01 MED ORDER — IOHEXOL 300 MG/ML  SOLN
100.0000 mL | Freq: Once | INTRAMUSCULAR | Status: AC | PRN
  Administered 2023-10-01: 100 mL via INTRAVENOUS

## 2023-10-01 MED ORDER — AMLODIPINE BESYLATE 5 MG PO TABS
5.0000 mg | ORAL_TABLET | Freq: Every day | ORAL | Status: DC
Start: 2023-10-02 — End: 2023-10-04
  Administered 2023-10-02 – 2023-10-04 (×3): 5 mg via ORAL
  Filled 2023-10-01 (×3): qty 1

## 2023-10-01 MED ORDER — FENTANYL CITRATE (PF) 250 MCG/5ML IJ SOLN
INTRAMUSCULAR | Status: AC
  Filled 2023-10-01: qty 5

## 2023-10-01 MED ORDER — CHLORHEXIDINE GLUCONATE 0.12 % MT SOLN: 15.0000 mL | Freq: Once | OROMUCOSAL | Status: DC

## 2023-10-01 MED ORDER — BUPIVACAINE HCL (PF) 0.5 % IJ SOLN
INTRAMUSCULAR | Status: AC
Start: 2023-10-01 — End: ?
  Filled 2023-10-01: qty 30

## 2023-10-01 MED ORDER — PIPERACILLIN-TAZOBACTAM 3.375 G IVPB 30 MIN
3.3750 g | Freq: Once | INTRAVENOUS | Status: AC
  Administered 2023-10-01: 3.375 g via INTRAVENOUS
  Filled 2023-10-01: qty 50

## 2023-10-01 MED ORDER — ONDANSETRON HCL 4 MG/2ML IJ SOLN
INTRAMUSCULAR | Status: DC | PRN
  Administered 2023-10-01: 4 mg via INTRAVENOUS

## 2023-10-01 MED ORDER — INSULIN ASPART 100 UNIT/ML IJ SOLN
0.0000 [IU] | Freq: Three times a day (TID) | INTRAMUSCULAR | Status: DC
Start: 2023-10-01 — End: 2023-10-04
  Administered 2023-10-01: 2 [IU] via SUBCUTANEOUS
  Filled 2023-10-01: qty 0.15

## 2023-10-01 MED ORDER — ROCURONIUM BROMIDE 10 MG/ML (PF) SYRINGE
PREFILLED_SYRINGE | INTRAVENOUS | Status: AC
  Filled 2023-10-01: qty 10

## 2023-10-01 MED ORDER — CHLORTHALIDONE 25 MG PO TABS
25.0000 mg | ORAL_TABLET | Freq: Every day | ORAL | Status: DC
  Administered 2023-10-02 – 2023-10-04 (×3): 25 mg via ORAL
  Filled 2023-10-01 (×3): qty 1

## 2023-10-01 MED ORDER — BISOPROLOL FUMARATE 5 MG PO TABS
10.0000 mg | ORAL_TABLET | Freq: Every day | ORAL | Status: DC
  Administered 2023-10-02 – 2023-10-04 (×3): 10 mg via ORAL
  Filled 2023-10-01 (×3): qty 2

## 2023-10-01 MED ORDER — SODIUM CHLORIDE 0.9 % IV BOLUS
1000.0000 mL | Freq: Once | INTRAVENOUS | Status: AC
  Administered 2023-10-01: 1000 mL via INTRAVENOUS

## 2023-10-01 MED ORDER — PANTOPRAZOLE SODIUM 40 MG IV SOLR
40.0000 mg | Freq: Every day | INTRAVENOUS | Status: DC
  Administered 2023-10-01 – 2023-10-03 (×3): 40 mg via INTRAVENOUS
  Filled 2023-10-01 (×3): qty 10

## 2023-10-01 MED ORDER — SUGAMMADEX SODIUM 200 MG/2ML IV SOLN
INTRAVENOUS | Status: DC | PRN
  Administered 2023-10-01: 300 mg via INTRAVENOUS

## 2023-10-01 MED ORDER — MAGNESIUM SULFATE 2 GM/50ML IV SOLN
2.0000 g | Freq: Once | INTRAVENOUS | Status: AC
  Administered 2023-10-01: 2 g via INTRAVENOUS
  Filled 2023-10-01: qty 50

## 2023-10-01 MED ORDER — SUCCINYLCHOLINE CHLORIDE 200 MG/10ML IV SOSY
PREFILLED_SYRINGE | INTRAVENOUS | Status: AC
Start: 2023-10-01 — End: ?
  Filled 2023-10-01: qty 10

## 2023-10-01 MED ORDER — OXYCODONE HCL 5 MG PO TABS
5.0000 mg | ORAL_TABLET | ORAL | Status: DC | PRN
Start: 2023-10-01 — End: 2023-10-04
  Administered 2023-10-01 – 2023-10-02 (×2): 5 mg via ORAL
  Filled 2023-10-01 (×2): qty 1

## 2023-10-01 MED ORDER — MIDAZOLAM HCL 2 MG/2ML IJ SOLN
INTRAMUSCULAR | Status: DC | PRN
  Administered 2023-10-01 (×2): 1 mg via INTRAVENOUS

## 2023-10-01 MED ORDER — LIDOCAINE HCL (PF) 2 % IJ SOLN
INTRAMUSCULAR | Status: AC
  Filled 2023-10-01: qty 5

## 2023-10-01 MED ORDER — PROPOFOL 10 MG/ML IV BOLUS
INTRAVENOUS | Status: AC
Start: 2023-10-01 — End: ?
  Filled 2023-10-01: qty 20

## 2023-10-01 MED ORDER — ACETAMINOPHEN 500 MG PO TABS
1000.0000 mg | ORAL_TABLET | Freq: Four times a day (QID) | ORAL | Status: DC
  Administered 2023-10-01 – 2023-10-04 (×11): 1000 mg via ORAL
  Filled 2023-10-01 (×12): qty 2

## 2023-10-01 MED ORDER — DIPHENHYDRAMINE HCL 50 MG/ML IJ SOLN: 12.5000 mg | Freq: Four times a day (QID) | INTRAMUSCULAR | Status: DC | PRN

## 2023-10-01 MED ORDER — OXYCODONE HCL 5 MG PO TABS: 5.0000 mg | ORAL_TABLET | Freq: Once | ORAL | Status: DC | PRN

## 2023-10-01 MED ORDER — POTASSIUM CHLORIDE 10 MEQ/100ML IV SOLN: 10.0000 meq | INTRAVENOUS | Status: DC

## 2023-10-01 MED ORDER — HYDROMORPHONE HCL 1 MG/ML IJ SOLN
0.2500 mg | INTRAMUSCULAR | Status: DC | PRN
  Administered 2023-10-01 (×2): 0.5 mg via INTRAVENOUS
  Filled 2023-10-01 (×2): qty 0.5

## 2023-10-01 MED ORDER — CHLORHEXIDINE GLUCONATE CLOTH 2 % EX PADS
6.0000 | MEDICATED_PAD | Freq: Once | CUTANEOUS | Status: AC
  Administered 2023-10-01: 6 via TOPICAL

## 2023-10-01 MED ORDER — LACTATED RINGERS IV SOLN: INTRAVENOUS | Status: DC | PRN

## 2023-10-01 MED ORDER — METOPROLOL TARTRATE 5 MG/5ML IV SOLN: 5.0000 mg | Freq: Four times a day (QID) | INTRAVENOUS | Status: DC | PRN

## 2023-10-01 MED ORDER — BUPIVACAINE HCL (PF) 0.5 % IJ SOLN
INTRAMUSCULAR | Status: DC | PRN
  Administered 2023-10-01: 30 mL

## 2023-10-01 MED ORDER — ONDANSETRON HCL 4 MG/2ML IJ SOLN
INTRAMUSCULAR | Status: AC
  Filled 2023-10-01: qty 2

## 2023-10-01 MED ORDER — LACTATED RINGERS IV SOLN
INTRAVENOUS | Status: DC
Start: 2023-10-01 — End: 2023-10-01

## 2023-10-01 MED ORDER — FENTANYL CITRATE (PF) 250 MCG/5ML IJ SOLN
INTRAMUSCULAR | Status: DC | PRN
  Administered 2023-10-01 (×2): 50 ug via INTRAVENOUS
  Administered 2023-10-01: 25 ug via INTRAVENOUS

## 2023-10-01 MED ORDER — CALCITRIOL 0.25 MCG PO CAPS
0.2500 ug | ORAL_CAPSULE | Freq: Every day | ORAL | Status: DC
  Administered 2023-10-02 – 2023-10-04 (×3): 0.25 ug via ORAL
  Filled 2023-10-01 (×3): qty 1

## 2023-10-01 MED ORDER — ROCURONIUM BROMIDE 10 MG/ML (PF) SYRINGE
PREFILLED_SYRINGE | INTRAVENOUS | Status: DC | PRN
  Administered 2023-10-01: 40 mg via INTRAVENOUS
  Administered 2023-10-01: 10 mg via INTRAVENOUS

## 2023-10-01 MED ORDER — ONDANSETRON 4 MG PO TBDP: 4.0000 mg | ORAL_TABLET | Freq: Four times a day (QID) | ORAL | Status: DC | PRN

## 2023-10-01 MED ORDER — LIDOCAINE HCL (PF) 2 % IJ SOLN
INTRAMUSCULAR | Status: DC | PRN
  Administered 2023-10-01: 100 mg via INTRADERMAL

## 2023-10-01 MED ORDER — MIDAZOLAM HCL 2 MG/2ML IJ SOLN
INTRAMUSCULAR | Status: AC
  Filled 2023-10-01: qty 2

## 2023-10-01 MED ORDER — SUCCINYLCHOLINE CHLORIDE 200 MG/10ML IV SOSY
PREFILLED_SYRINGE | INTRAVENOUS | Status: DC | PRN
  Administered 2023-10-01: 100 mg via INTRAVENOUS

## 2023-10-01 MED ORDER — PIPERACILLIN-TAZOBACTAM 3.375 G IVPB
3.3750 g | Freq: Three times a day (TID) | INTRAVENOUS | Status: DC
Start: 2023-10-01 — End: 2023-10-03
  Administered 2023-10-01 – 2023-10-03 (×5): 3.375 g via INTRAVENOUS
  Filled 2023-10-01 (×10): qty 50

## 2023-10-01 MED ORDER — PHENYLEPHRINE 80 MCG/ML (10ML) SYRINGE FOR IV PUSH (FOR BLOOD PRESSURE SUPPORT)
PREFILLED_SYRINGE | INTRAVENOUS | Status: DC | PRN
  Administered 2023-10-01 (×3): 160 ug via INTRAVENOUS

## 2023-10-01 MED ORDER — ONDANSETRON HCL 4 MG/2ML IJ SOLN: 4.0000 mg | Freq: Once | INTRAMUSCULAR | Status: DC | PRN

## 2023-10-01 MED ORDER — DOCUSATE SODIUM 100 MG PO CAPS
100.0000 mg | ORAL_CAPSULE | Freq: Two times a day (BID) | ORAL | Status: DC
  Administered 2023-10-01 – 2023-10-04 (×6): 100 mg via ORAL
  Filled 2023-10-01 (×6): qty 1

## 2023-10-01 MED ORDER — DEXMEDETOMIDINE HCL IN NACL 80 MCG/20ML IV SOLN
INTRAVENOUS | Status: DC | PRN
  Administered 2023-10-01 (×3): 8 ug via INTRAVENOUS

## 2023-10-01 SURGICAL SUPPLY — 44 items
BLADE SURG 15 STRL LF DISP TIS (BLADE) ×1 IMPLANT
CHLORAPREP W/TINT 26 (MISCELLANEOUS) ×1 IMPLANT
COVER TIP SHEARS 8 DVNC (MISCELLANEOUS) ×1 IMPLANT
DERMABOND ADVANCED .7 DNX12 (GAUZE/BANDAGES/DRESSINGS) ×1 IMPLANT
DRAPE ARM DVNC X/XI (DISPOSABLE) ×3 IMPLANT
DRAPE COLUMN DVNC XI (DISPOSABLE) ×1 IMPLANT
ELECT REM PT RETURN 9FT ADLT (ELECTROSURGICAL) ×1
ELECTRODE REM PT RTRN 9FT ADLT (ELECTROSURGICAL) ×1 IMPLANT
GLOVE BIO SURGEON STRL SZ 6.5 (GLOVE) ×1 IMPLANT
GLOVE BIOGEL PI IND STRL 6.5 (GLOVE) ×1 IMPLANT
GLOVE BIOGEL PI IND STRL 7.0 (GLOVE) ×2 IMPLANT
GLOVE SURG SS PI 7.0 STRL IVOR (GLOVE) IMPLANT
GOWN STRL REUS W/TWL LRG LVL3 (GOWN DISPOSABLE) ×3 IMPLANT
IRRIGATOR SUCT 8 DISP DVNC XI (IRRIGATION / IRRIGATOR) IMPLANT
KIT PINK PAD W/HEAD ARE REST (MISCELLANEOUS) ×1 IMPLANT
KIT PINK PAD W/HEAD ARM REST (MISCELLANEOUS) ×1 IMPLANT
KIT TURNOVER KIT A (KITS) ×1 IMPLANT
MANIFOLD NEPTUNE II (INSTRUMENTS) ×1 IMPLANT
NDL HYPO 21X1.5 SAFETY (NEEDLE) IMPLANT
NDL INSUFFLATION 14GA 120MM (NEEDLE) ×1 IMPLANT
NEEDLE HYPO 21X1.5 SAFETY (NEEDLE) ×1 IMPLANT
NEEDLE INSUFFLATION 14GA 120MM (NEEDLE) ×1 IMPLANT
OBTURATOR OPTICAL STND 8 DVNC (TROCAR) ×1
OBTURATOR OPTICALSTD 8 DVNC (TROCAR) ×1 IMPLANT
PACK LAP CHOLE LZT030E (CUSTOM PROCEDURE TRAY) ×1 IMPLANT
PAD ARMBOARD 7.5X6 YLW CONV (MISCELLANEOUS) ×1 IMPLANT
PENCIL ELECTRO HAND CTR (MISCELLANEOUS) ×1 IMPLANT
RELOAD STAPLE 45 2.5 WHT DVNC (STAPLE) IMPLANT
RELOAD STAPLER 2.5X45 WHT DVNC (STAPLE) ×1 IMPLANT
SEAL UNIV 5-12 XI (MISCELLANEOUS) ×3 IMPLANT
SEALER VESSEL EXT DVNC XI (MISCELLANEOUS) IMPLANT
SET BASIN LINEN APH (SET/KITS/TRAYS/PACK) ×1 IMPLANT
SET TUBE SMOKE EVAC HIGH FLOW (TUBING) ×1 IMPLANT
STAPLER 45 SUREFORM DVNC (STAPLE) ×1 IMPLANT
STAPLER RELOAD 2.5X45 WHT DVNC (STAPLE) ×1
SUT ETHILON 3 0 FSL (SUTURE) IMPLANT
SUT MNCRL AB 4-0 PS2 18 (SUTURE) IMPLANT
SUT VICRYL 0 UR6 27IN ABS (SUTURE) ×1 IMPLANT
SYR 30ML LL (SYRINGE) ×1 IMPLANT
SYS BAG RETRIEVAL 10MM (BASKET) ×1
SYSTEM BAG RETRIEVAL 10MM (BASKET) IMPLANT
TAPE TRANSPORE STRL 2 31045 (GAUZE/BANDAGES/DRESSINGS) ×1 IMPLANT
TRAY FOLEY MTR SLVR 16FR STAT (SET/KITS/TRAYS/PACK) ×1 IMPLANT
WATER STERILE IRR 500ML POUR (IV SOLUTION) ×1 IMPLANT

## 2023-10-01 NOTE — Transfer of Care (Signed)
 Immediate Anesthesia Transfer of Care Note  Patient: Jill Banks  Procedure(s) Performed: XI ROBOTIC LAPAROSCOPIC ASSISTED APPENDECTOMY (Abdomen)  Patient Location: PACU  Anesthesia Type:General  Level of Consciousness: awake, alert , and oriented  Airway & Oxygen Therapy: Patient Spontanous Breathing and Patient connected to face mask oxygen  Post-op Assessment: Report given to RN and Post -op Vital signs reviewed and stable  Post vital signs: Reviewed and stable  Last Vitals:  Vitals Value Taken Time  BP    Temp    Pulse    Resp    SpO2      Last Pain:  Vitals:   10/01/23 1021  TempSrc: Oral  PainSc: 7       Patients Stated Pain Goal: 7 (10/01/23 1021)  Complications: No notable events documented.

## 2023-10-01 NOTE — H&P (Signed)
 I was present with the medical student for this service. I personally verified the history of present illness, performed the physical exam, and made the plan for this encounter. I have verified the medical student's documentation and made modifications where appropriately. I have personally documented in my own words a brief history, physical, and plan below.      Patient seen and has acute appendicitis. No signs of abscess or rupture at this time. Some significant stranding. Reviewed CT and showed patient. Also with diastasis and small 1cm hernia central to the diastasis, should be able to stay away from this area with surgery.    On oral semaglutide  and discussed with anesthesia, they will RSI.    Discussed the risk of laparoscopic appendectomy and the option of antibiotics alone. Discussed that in [place] and some trials in the [place] , antibiotics are used for simple appendicitis. Discussed that research shows a 40% failure rate for antibiotics alone.  Discussed risk of surgery including but not limited to bleeding, infection, injury to other organs, normal appendix, and after this discussion the patient has decided to proceed with surgery.         Reason for Consult: Acute Appendicitis Referring Physician: Eve Hinders, MD- emergency Department   Jill Banks is an 68 y.o. female w/ PMHx T2DM, HLD, HTN, OSA, non-alcoholic hepatic steatosis, chronic thrombocytopenia who presents to the ED for lower abdominal pain since Sunday that has increased in severity and localized to the RLQ with time. She notes having decreased appetite, last meal being a small amount of Chicken Noodle Soup yesterday. Her last bowel movement was Sunday, but she is passing flatus. The patient denies nausea, vomiting, or subjective fever, but confirms having some chills. Taking ibuprofen  800 mg did not help the pain, she has only taken 3 pills since the pain onset. Her surgical history includes total right knee replacement  (02/14/23) and left knee replacement in 2020, no abdominal surgeries. No sick contacts. She is not on a blood thinner. Her chronic thrombocytopenia has been present for two years and is idiopathic and stable. She takes an oral semaglutide  each morning for her diabetes. She has had an umbilical hernia for 5-6 years that is stable.       Past Medical History:  Diagnosis Date   Anxiety     Arthritis     Blood dyscrasia      Thrombocytopenia   Bruises easily     COVID-19 04/2019   Diabetes mellitus without complication (HCC)     Headache     HLD (hyperlipidemia)     Hypertension     Leg pain     Pneumonia      about 5 yrs ago   Sleep apnea      no CPAP               Past Surgical History:  Procedure Laterality Date   APPLICATION OF WOUND VAC 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    Procedure: APPLICATION OF WOUND VAC;  Surgeon: Molli Angelucci, MD;  Location: [facility] ORS;  Service: Orthopedics;  Laterality: Left;  #[id number]   COLONOSCOPY WITH PROPOFOL  N/A 09/25/2021    Procedure: COLONOSCOPY WITH PROPOFOL ;  Surgeon: Selena Daily, MD;  Location: [facility] ENDOSCOPY;  Service: Gastroenterology;  Laterality: N/A;   DILATATION & CURETTAGE/HYSTEROSCOPY WITH MYOSURE   10/17/2020    Procedure: DILATATION & CURETTAGE/HYSTEROSCOPY WITH MYOSURE POLYPECTOMY;  Surgeon: Heron Lord, MD;  Location: [facility] ORS;  Service: Gynecology;;   DILATION AND CURETTAGE OF UTERUS  x 3   TOE SURGERY Left      second toe   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    Procedure: LEFT TOTAL KNEE ARTHROPLASTY;  Surgeon: Molli Angelucci, MD;  Location: [facility] ORS;  Service: Orthopedics;  Laterality: Left;   TOTAL KNEE ARTHROPLASTY Right 02/14/2023    Procedure: RIGHT TOTAL KNEE ARTHROPLASTY;  Surgeon: Arnie Lao, MD;  Location: [facility] ORS;  Service: Orthopedics;  Laterality: Right;               Family History  Problem Relation Age of Onset   Hypertension Father     Stroke Father     Hypertension Brother      Stroke Brother     Hyperlipidemia Brother     Breast cancer Maternal Aunt     Asthma Son            Social History:  reports that she quit smoking about 16 years ago. Her smoking use included cigarettes. She started smoking about 18 years ago. She has a 0.5 pack-year smoking history. She has never used smokeless tobacco. She reports current alcohol use. She reports that she does not use drugs.   Allergies:  Allergies  No Known Allergies     Medications: I have reviewed the patient's current medications.          Current Facility-Administered Medications  Medication Dose Route Frequency Provider Last Rate Last Admin   cefoTEtan  (CEFOTAN ) 2 g in sodium chloride  0.9 % 100 mL IVPB  2 g Intravenous On Call to OR Awilda Bogus, MD     chlorhexidine  (PERIDEX ) 0.12 % solution            potassium chloride  10 mEq in 100 mL IVPB  10 mEq Intravenous Q1 Hr x 2 Awilda Bogus, MD 100 mL/hr at 10/01/23 0935 10 mEq at 10/01/23 0935             Current Outpatient Medications  Medication Sig Dispense Refill Last Dose/Taking   Accu-Chek Softclix Lancets lancets Use as instructed to check blood sugars once daily 300 each 3     amLODipine  (NORVASC ) 5 MG tablet Take 1 tablet (5 mg total) by mouth daily. 90 tablet 3     bisoprolol  (ZEBETA ) 10 MG tablet Take 1 tablet (10 mg total) by mouth daily. 90 tablet 3     Blood Glucose Monitoring Suppl (ONETOUCH VERIO FLEX SYSTEM) w/Device KIT           calcitRIOL  (ROCALTROL ) 0.25 MCG capsule TAKE (1) CAPSULE BY MOUTH ONCE DAILY. 90 capsule 5     chlorthalidone  (HYGROTON ) 25 MG tablet Take 1 tablet (25 mg total) by mouth daily. 90 tablet 1     cyanocobalamin  (VITAMIN B12) 1000 MCG tablet Take 1 tablet (1,000 mcg total) by mouth daily. 90 tablet 1     diclofenac sodium (VOLTAREN) 1 % GEL Apply 2 g topically 4 (four) times daily as needed (knee pain).         glucose blood test strip Use as instructed to check blood sugars once daily E11.65 300 each 3      naproxen sodium (ALEVE) 220 MG tablet Take 440 mg by mouth 2 (two) times daily as needed (pain).         NON FORMULARY cpap device         potassium chloride  SA (KLOR-CON  M) 20 MEQ tablet TAKE 1 TABLET BY MOUTH TWICE DAILY 180 tablet 11     Semaglutide  (RYBELSUS ) 7 MG  TABS Take 1 tablet (7 mg total) by mouth daily. 90 tablet 4        Lab Results Last 48 Hours        Results for orders placed or performed during the hospital encounter of 10/01/23 (from the past 48 hours)  Urinalysis, w/ Reflex to Culture (Infection Suspected) -Urine, Clean Catch     Status: Abnormal    Collection Time: 10/01/23  4:39 AM  Result Value Ref Range    Specimen Source URINE, CLEAN CATCH      Color, Urine YELLOW YELLOW    APPearance CLEAR CLEAR    Specific Gravity, Urine 1.019 1.005 - 1.030    pH 5.0 5.0 - 8.0    Glucose, UA NEGATIVE NEGATIVE mg/dL    Hgb urine dipstick SMALL (A) NEGATIVE    Bilirubin Urine NEGATIVE NEGATIVE    Ketones, ur NEGATIVE NEGATIVE mg/dL    Protein, ur NEGATIVE NEGATIVE mg/dL    Nitrite NEGATIVE NEGATIVE    Leukocytes,Ua TRACE (A) NEGATIVE    RBC / HPF 0-5 0 - 5 RBC/hpf    WBC, UA 11-20 0 - 5 WBC/hpf      Comment:        Reflex urine culture not performed if WBC <=10, OR if Squamous epithelial cells >5. If Squamous epithelial cells >5 suggest recollection.      Bacteria, UA NONE SEEN NONE SEEN    Squamous Epithelial / HPF 0-5 0 - 5 /HPF    Mucus PRESENT        Comment: Performed at [facility], [address], [place], [place] [address]  CBC with Differential     Status: Abnormal    Collection Time: 10/01/23  4:59 AM  Result Value Ref Range    WBC 15.7 (H) 4.0 - 10.5 K/uL      Comment: WHITE COUNT CONFIRMED ON SMEAR    RBC 4.92 3.87 - 5.11 MIL/uL    Hemoglobin 13.8 12.0 - 15.0 g/dL    HCT 95.6 21.3 - 08.6 %    MCV 83.7 80.0 - 100.0 fL    MCH 28.0 26.0 - 34.0 pg    MCHC 33.5 30.0 - 36.0 g/dL    RDW 57.8 46.9 - 62.9 %    Platelets 124 (L) 150 - 400 K/uL    nRBC 0.0 0.0  - 0.2 %    Neutrophils Relative % 75 %    Neutro Abs 11.8 (H) 1.7 - 7.7 K/uL    Lymphocytes Relative 19 %    Lymphs Abs 3.0 0.7 - 4.0 K/uL    Monocytes Relative 5 %    Monocytes Absolute 0.8 0.1 - 1.0 K/uL    Eosinophils Relative 1 %    Eosinophils Absolute 0.2 0.0 - 0.5 K/uL    Basophils Relative 0 %    Basophils Absolute 0.0 0.0 - 0.1 K/uL    WBC Morphology MORPHOLOGY UNREMARKABLE      RBC Morphology MORPHOLOGY UNREMARKABLE      Smear Review See Note        Comment: LARGE PLATELETS    Abs Immature Granulocytes 0.00 0.00 - 0.07 K/uL      Comment: Performed at [facility], [address], [place], [place] [address]  Comprehensive metabolic panel     Status: Abnormal    Collection Time: 10/01/23  4:59 AM  Result Value Ref Range    Sodium 134 (L) 135 - 145 mmol/L    Potassium 2.6 (LL) 3.5 - 5.1 mmol/L  Comment: CRITICAL RESULT CALLED TO, READ BACK BY AND VERIFIED WITH TALBOT,T AT 5:25AM ON 10/01/23 BY FESTERMAN,C    Chloride 98 98 - 111 mmol/L    CO2 27 22 - 32 mmol/L    Glucose, Bld 108 (H) 70 - 99 mg/dL      Comment: Glucose reference range applies only to samples taken after fasting for at least 8 hours.    BUN 12 8 - 23 mg/dL    Creatinine, Ser 6.30 0.44 - 1.00 mg/dL    Calcium  9.3 8.9 - 10.3 mg/dL    Total Protein 7.0 6.5 - 8.1 g/dL    Albumin 3.5 3.5 - 5.0 g/dL    AST 15 15 - 41 U/L    ALT 11 0 - 44 U/L    Alkaline Phosphatase 47 38 - 126 U/L    Total Bilirubin 1.2 0.0 - 1.2 mg/dL    GFR, Estimated >16 >01 mL/min      Comment: (NOTE) Calculated using the CKD-EPI Creatinine Equation (2021)      Anion gap 9 5 - 15      Comment: Performed at [facility], [address], [place], [place] [address]  Lipase, blood     Status: None    Collection Time: 10/01/23  4:59 AM  Result Value Ref Range    Lipase 24 11 - 51 U/L      Comment: Performed at [facility], [address], [place], [place] [address]  Magnesium      Status: Abnormal    Collection Time: 10/01/23  4:59 AM   Result Value Ref Range    Magnesium  1.6 (L) 1.7 - 2.4 mg/dL      Comment: Performed at [facility], [address], [place], [place] [address]         Imaging Results (Last 48 hours)  CT ABDOMEN PELVIS W CONTRAST Result Date: 10/01/2023 CLINICAL DATA:  68 year old female with history of right lower quadrant abdominal pain. Diarrhea. EXAM: CT ABDOMEN AND PELVIS WITH CONTRAST TECHNIQUE: Multidetector CT imaging of the abdomen and pelvis was performed using the standard protocol following bolus administration of intravenous contrast. RADIATION DOSE REDUCTION: This exam was performed according to the departmental dose-optimization program which includes automated exposure control, adjustment of the mA and/or kV according to patient size and/or use of iterative reconstruction technique. CONTRAST:  OMNIPAQUE  IOHEXOL  300 MG/ML  SOLN COMPARISON:  CT of the abdomen and pelvis 07/18/2020. FINDINGS: Lower chest: Unremarkable. Hepatobiliary: Multiple low-attenuation lesions are scattered throughout the liver, largest of which are compatible with simple cysts, measuring up to 1.9 x 1.3 cm in the periphery of the right lobe of the liver (axial image 20 of series 2). Other smaller lesions are too small to definitively characterize, but appear similar to prior studies and statistically likely to represent tiny cysts and/or biliary hamartomas (no imaging follow-up for any of these lesions is recommended). No aggressive appearing hepatic lesions. No intra or extrahepatic biliary ductal dilatation. Gallbladder is unremarkable in appearance. Pancreas: No pancreatic mass. No pancreatic ductal dilatation. No pancreatic or peripancreatic fluid collections or inflammatory changes. Spleen: Unremarkable. Adrenals/Urinary Tract: Nonobstructive calculi measuring 2-3 mm in the right renal collecting system. Bilateral kidneys and adrenal glands are otherwise normal in appearance. No hydroureteronephrosis. Urinary bladder is  nearly decompressed, but otherwise unremarkable in appearance. Stomach/Bowel: The appearance of the stomach is normal. Numerous colonic diverticuli are noted, without definite focal surrounding inflammatory changes to indicate an acute diverticulitis at this time. The appendix is dilated  and inflamed, indicative of acute appendicitis. Appendix: Location: Inferior to the cecum Diameter: Up to 15 mm Appendicolith: None Mucosal hyper-enhancement: Present Extraluminal gas: None Periappendiceal collection: Extensive periappendiceal soft tissue stranding and trace volume of fluid. No well organized periappendiceal fluid collection to suggest abscess at this time. Vascular/Lymphatic: Atherosclerosis in the abdominal aorta and pelvic vasculature. No lymphadenopathy noted in the abdomen or pelvis. Reproductive: Uterus is enlarged and heterogeneous in appearance with multiple heterogeneously enhancing lesions compatible with multifocal fibroids, largest of which measures approximately 6.8 x 4.9 cm in the right side of the uterine body. Ovaries are unremarkable in appearance. Other: No significant volume of ascites.  No pneumoperitoneum. Musculoskeletal: There are no aggressive appearing lytic or blastic lesions noted in the visualized portions of the skeleton. IMPRESSION: 1. Acute appendicitis with extensive periappendiceal inflammatory changes but no definite periappendiceal abscess confidently identified at this time. Surgical consultation is strongly recommended. 2. Extensive colonic diverticulosis without definite evidence of acute diverticulitis at this time. 3. 2-3 mm nonobstructive calculi in the right renal collecting system. 4. Aortic atherosclerosis. 5. Additional incidental findings, as above. Electronically Signed   By: Alexandria Angel M.D.   On: 10/01/2023 06:07       ROS:  Pertinent items noted in HPI and remainder of comprehensive ROS otherwise negative.   Blood pressure (!) 147/93, pulse 88, temperature  98.6 F (37 C), temperature source Oral, resp. rate 18, height 5\' 7"  (1.702 m), weight 110.2 kg, SpO2 97%.   Physical Exam HENT:     Head: Normocephalic.     Comments: Hearing is grossly normal Eyes:     Extraocular Movements: Extraocular movements intact.     Comments: Eye movements grossly normal  Cardiovascular:     Rate and Rhythm: Normal rate and regular rhythm.  Pulmonary:     Effort: Pulmonary effort is normal.  Abdominal:     General: There is no distension.     Tenderness: There is abdominal tenderness in the right lower quadrant, suprapubic area and left lower quadrant. Positive signs include Rovsing's sign.     Hernia: A hernia is present. Hernia is present in the umbilical area (tiny, reducible).     Comments: Pain to RLQ elicited by tapping right heel  Skin:    General: Skin is warm and dry.     Capillary Refill: Capillary refill takes less than 2 seconds.  Neurological:     General: No focal deficit present.     Mental Status: She is alert and oriented to person, place, and time.  Psychiatric:        Mood and Affect: Mood normal.        Behavior: Behavior normal.          Assessment/Plan: Jill Banks is an 68 y.o. female w/ PMHx T2DM, HLD, HTN, OSA, non-alcoholic hepatic steatosis, chronic thrombocytopenia who presented to the ED for lower, mainly right-sided, abdominal pain, decreased appetite, and chills for 3 days who was found to have appendicitis on CT.   Acute Appendicitis WBC 15.7, RLQ tenderness to palpation, pain elicited with right heel tap, CT showed 15 mm in diameter appendix with surrounding inflammatory changes. Her potassium and magnesium  are low, but being treated as explained below. Not on blood thinners. Oral semaglutide  daily. We will go forward with appendectomy today. - Preop EKG - Cefotan  2g - Replete electrolytes - Robotic Lap appendectomy today   Hypokalemia  Hypomagnesemia K+ 2.6, Mg++ 1.6. Both being repleted. She will have  received 40 mEq  of IV K+ and 2g IV magnesium  prior to operation. 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 Platelets 124k today, which is baseline for at least one year. No concerns for acute drop, no signs of bleeding.   Kari Otto Garr 10/01/2023, 7:07 AM

## 2023-10-01 NOTE — Progress Notes (Signed)
   10/01/23 1119  TOC Brief Assessment  Insurance and Status Reviewed  Patient has primary care physician Yes  Home environment has been reviewed Single Family Home  Prior level of function: Independent  Prior/Current Home Services No current home services  Social Drivers of Health Review SDOH reviewed no interventions necessary  Readmission risk has been reviewed Yes  Transition of care needs no transition of care needs at this time   Transition of Care Department [facility]) has reviewed patient and no TOC needs have been identified at this time. We will continue to monitor patient advancement through interdisciplinary progression rounds. If new patient transition needs arise, please place a TOC consult.

## 2023-10-01 NOTE — Anesthesia Preprocedure Evaluation (Addendum)
 Anesthesia Evaluation  Patient identified by MRN, date of birth, ID band Patient awake    Reviewed: Allergy & Precautions, NPO status , Patient's Chart, lab work & pertinent test results  History of Anesthesia Complications Negative for: history of anesthetic complications  Airway Mallampati: III  TM Distance: >3 FB Neck ROM: Full    Dental no notable dental hx. (+) Teeth Intact, Dental Advisory Given   Pulmonary sleep apnea and Continuous Positive Airway Pressure Ventilation , pneumonia, resolved, neg COPD, Patient abstained from smoking.Not current smoker, former smoker   Pulmonary exam normal breath sounds clear to auscultation       Cardiovascular Exercise Tolerance: Good METShypertension, (-) CAD and (-) Past MI Normal cardiovascular exam(-) dysrhythmias  Rhythm:Regular Rate:Normal - Systolic murmurs    Neuro/Psych  Headaches PSYCHIATRIC DISORDERS Anxiety      Neuromuscular disease    GI/Hepatic Neg liver ROS,neg GERD  ,,(+)     (-) substance abuse    Endo/Other  diabetes, Well Controlled, Type 2  Class 3 obesity  Renal/GU negative Renal ROS     Musculoskeletal  (+) Arthritis , Osteoarthritis,    Abdominal  (+) + obese  Peds  Hematology   Anesthesia Other Findings Past Medical History: No date: Anxiety No date: Arthritis No date: Bruises easily 04/2019: COVID-19 No date: Diabetes mellitus without complication (HCC) No date: Headache No date: HLD (hyperlipidemia) No date: Hypertension No date: Leg pain No date: Pneumonia     Comment:  about 5 yrs ago No date: Sleep apnea  Reproductive/Obstetrics                             Anesthesia Physical Anesthesia Plan  ASA: 3  Anesthesia Plan: General   Post-op Pain Management: Dilaudid  IV   Induction: Intravenous, Cricoid pressure planned and Rapid sequence  PONV Risk Score and Plan: 3 and Ondansetron , Midazolam  and  Dexamethasone   Airway Management Planned: Oral ETT and Video Laryngoscope Planned  Additional Equipment: None  Intra-op Plan:   Post-operative Plan: Extubation in OR  Informed Consent: I have reviewed the patients History and Physical, chart, labs and discussed the procedure including the risks, benefits and alternatives for the proposed anesthesia with the patient or authorized representative who has indicated his/her understanding and acceptance.     Dental advisory given  Plan Discussed with: CRNA and Surgeon  Anesthesia Plan Comments: (Discussed risks of anesthesia with patient, including possibility of difficulty with spontaneous ventilation under anesthesia necessitating airway intervention, PONV, and rare risks such as cardiac or respiratory or neurological events, and allergic reactions. Discussed the role of CRNA in patient's perioperative care. Patient understands.)        Anesthesia Quick Evaluation

## 2023-10-01 NOTE — Op Note (Signed)
 10/01/23  Preoperative diagnosis: Acute appendicitis  Postoperative diagnosis: Acute perforated appendicitis  Procedure: Robotic assisted laparoscopic appendectomy  Anesthesia: General   Surgeon: Deena Farrier, MD   Wound Classification: Dirty infected   Specimen: Appendix  Complications: None  Estimated Blood Loss: Minimal    Indications: Patient is a 68 y.o. female  presented with RLQ pain. The risk of surgery were explained to the patient including but not limited to bleeding, infection, finding a rupture, injury to other organs, needing to do an open procedure.   FIndings: Perforated appendicitis  Upon entering the abdomen (organ space), I encountered an abscess in the right lower quadrant at the perforated appendix with phlegmon surrounding the area .   Description of procedure: The patient was placed on the operating table in the supine position, left arm tucked. General anesthesia was induced. A time-out was completed verifying correct patient, procedure, site, positioning, and implant(s) and/or special equipment prior to beginning this procedure. The abdomen was prepped and draped in the usual sterile fashion.   At Palmer's point an incision was made and Veress needle was inserted.  I could not get confirmation of intraabdominal placement due to very thin and floppy abdominal wall. I then proceeded to go to the umbilical site where hse had a 1cm hernia, which I know I could get into with the Veress and elevation of the abdomen with towel clip. I was able to confirm intraabdominal placement immediately. After confirming intraabdominal location with positive saline drop test and low insufflation pressures, gas insufflation was initiated until the abdominal pressure was measured at 15 mmHg.  I then used the Veress site and extended this to enter with a 8 mm trocar with the Optiview technique. There was no signs of any violation of the peritoneal layer and the Veress site at the  umbilicus showed no signs of any injury.  The Veress was removed.   No injuries were noted. Two additional incision was made 8 cm apart each side of the initial incision.  An 8 mm port was in the left side below and 12mm port above the initial port more in the upper midline, both were placed under direct visualization.  No injuries from trocar placements were noted. The table was placed in the Trendelenburg position with the right side elevated.  Xi robotic platform was then brought to the operative field and docked. A vessel sealer was placed through the 12 mm port and a forced bipolar through the lower 8 mm port.   Upon entering the abdomen (organ space), I encountered an abscess in the right lower quadrant at the perforated appendix with phlegmon surrounding the area. An appendix that appeared dilated and had a rupture in the aspect. The appendix was identified and elevated.  Infection was present within the abdominal cavity due to appendicitis. Window created at base of appendix in the mesentery.  A white load linear cutting stapler was placed through the 12 mm port, and then used to divide and staple the base of the appendix, which was healthy.  The vessel sealer was used to ligate the mesoappendix. No bleeding from the staple lines noted.  The appendix was placed in an endoscopic retrieval bag and removed through the 12 mm port.   The staple line on the colon and mesoappendix staple line examined again and hemostasis noted. No other pathology was identified within pelvis. The 12 mm trocar removed and port site closed with a 0 vicryl under direct vision. I also closed the umbilical stab  incision with a 0 Vicryl deep to try to re-approximate the tissue. Remaining trocars were removed. No bleeding was noted.The abdomen was allowed to collapse.Thel skin incisions then closed with subcuticular sutures Monocryl 4-0.  I also used a 3-0 Nylon horizontal mattress on the umbilical stab incision due to how thin this  skin was on this area. Wounds then dressed with dermabond.  The patient tolerated the procedure well, awakened from anesthesia and was taken to the postanesthesia care unit in satisfactory condition.  Sponge count and instrument count correct at the end of the procedure.  Deena Farrier, MD [facility] [address] [place], [place] [address] [phone] (office)

## 2023-10-01 NOTE — Anesthesia Procedure Notes (Signed)
 Procedure Name: Intubation Date/Time: 10/01/2023 12:58 PM  Performed by: Sherwin Donate, CRNAPre-anesthesia Checklist: Patient identified, Emergency Drugs available, Suction available and Patient being monitored Patient Re-evaluated:Patient Re-evaluated prior to induction Oxygen Delivery Method: Circle system utilized Preoxygenation: Pre-oxygenation with 100% oxygen Induction Type: IV induction, Rapid sequence and Cricoid Pressure applied Laryngoscope Size: Glidescope and 3 Grade View: Grade I Tube type: Oral Tube size: 7.0 mm Number of attempts: 1 Airway Equipment and Method: Stylet and Video-laryngoscopy Placement Confirmation: ETT inserted through vocal cords under direct vision, positive ETCO2 and breath sounds checked- equal and bilateral Secured at: 23 cm Tube secured with: Tape Dental Injury: Teeth and Oropharynx as per pre-operative assessment

## 2023-10-01 NOTE — Anesthesia Postprocedure Evaluation (Signed)
 Anesthesia Post Note  Patient: Jill Banks  Procedure(s) Performed: XI ROBOTIC LAPAROSCOPIC ASSISTED APPENDECTOMY (Abdomen)  Patient location during evaluation: PACU Anesthesia Type: General Level of consciousness: awake and alert Pain management: pain level controlled Vital Signs Assessment: post-procedure vital signs reviewed and stable Respiratory status: spontaneous breathing, nonlabored ventilation, respiratory function stable and patient connected to nasal cannula oxygen Cardiovascular status: blood pressure returned to baseline and stable Postop Assessment: no apparent nausea or vomiting Anesthetic complications: no   There were no known notable events for this encounter.   Last Vitals:  Vitals:   10/01/23 1600 10/01/23 1617  BP: 123/66 (!) 85/67  Pulse: 94 89  Resp: (!) 9 12  Temp:  37.3 C  SpO2: 100% 92%    Last Pain:  Vitals:   10/01/23 1617  TempSrc: Oral  PainSc:                  Sandy Crumb

## 2023-10-01 NOTE — ED Triage Notes (Signed)
 Pt c/o right lower abdominal pain since Sunday; pt states she had one episode of diarrhea that day and has not had a BM since then  Pt denies any other sx such as urinary or n/v  Pt describes the pain as a stabbing pain and states the pain is constant and continues to get worse

## 2023-10-01 NOTE — Plan of Care (Signed)
   Problem: Education: Goal: Knowledge of General Education information will improve Description: Including pain rating scale, medication(s)/side effects and non-pharmacologic comfort measures Outcome: Progressing   Problem: Activity: Goal: Risk for activity intolerance will decrease Outcome: Progressing

## 2023-10-01 NOTE — Progress Notes (Signed)
 [facility]  Acute perforated appendicitis.  Prn for pain IS <OOB Clear diet Labs in AM Zosyn  post op, will transition to Augmentin  for 5 days total post op  SCDs  Update husband.   Deena Farrier, MD [facility] [address] Anise Barlow [place], [place] [address] [phone] (office)

## 2023-10-01 NOTE — ED Provider Notes (Signed)
 Emergency Department Provider Note  TRIAGE NOTE: Pt c/o right lower abdominal pain since Sunday; pt states she had one episode of diarrhea that day and has not had a BM since then  Pt denies any other sx such as urinary or n/v  Pt describes the pain as a stabbing pain and states the pain is constant and continues to get worse  HISTORY  Chief Complaint Abdominal Pain   HPI SADAE Jill Banks is a 68 y.o. female with abdominal pain.  Patient states that started on Saturday.  Progressively worsened since that time.  Gets better for short period time with ibuprofen  but does not fully go away.  Seems to be get more more severe.  Last bowel movement was on Sunday and it was loose.  Still passing gas.  Some nausea no vomiting.  No known fevers.  No trauma.  No rashes.  No history of similar symptoms.  Located in right upper and lower quadrants.  PMH Past Medical History:  Diagnosis Date   Anxiety    Arthritis    Blood dyscrasia    Thrombocytopenia   Bruises easily    COVID-19 04/2019   Diabetes mellitus without complication (HCC)    Headache    HLD (hyperlipidemia)    Hypertension    Leg pain    Pneumonia    about 5 yrs ago   Sleep apnea    no CPAP    Home Medications Prior to Admission medications   Medication Sig Start Date End Date Taking? Authorizing Provider  Accu-Chek Softclix Lancets lancets Use as instructed to check blood sugars once daily 05/25/21   Abernathy, Alyssa, NP  amLODipine  (NORVASC ) 5 MG tablet Take 1 tablet (5 mg total) by mouth daily. 06/17/23   Laurence Pons, NP  bisoprolol  (ZEBETA ) 10 MG tablet Take 1 tablet (10 mg total) by mouth daily. 06/17/23   Laurence Pons, NP  Blood Glucose Monitoring Suppl (ONETOUCH VERIO FLEX SYSTEM) w/Device KIT  05/25/21   [provider]  calcitRIOL  (ROCALTROL ) 0.25 MCG capsule TAKE (1) CAPSULE BY MOUTH ONCE DAILY. 04/10/23   Laurence Pons, NP  chlorthalidone  (HYGROTON ) 25 MG tablet Take 1 tablet (25 mg total)  by mouth daily. 06/17/23   Laurence Pons, NP  cyanocobalamin  (VITAMIN B12) 1000 MCG tablet Take 1 tablet (1,000 mcg total) by mouth daily. 05/12/23   Laurence Pons, NP  diclofenac sodium (VOLTAREN) 1 % GEL Apply 2 g topically 4 (four) times daily as needed (knee pain).    [provider]  glucose blood test strip Use as instructed to check blood sugars once daily E11.65 05/25/21   Abernathy, Alyssa, NP  naproxen sodium (ALEVE) 220 MG tablet Take 440 mg by mouth 2 (two) times daily as needed (pain).    [provider]  NON FORMULARY cpap device    [provider]  potassium chloride  SA (KLOR-CON  M) 20 MEQ tablet TAKE 1 TABLET BY MOUTH TWICE DAILY 01/20/23   Laurence Pons, NP  Semaglutide  (RYBELSUS ) 7 MG TABS Take 1 tablet (7 mg total) by mouth daily. 06/17/23   Laurence Pons, NP    Social History Social History   Tobacco Use   Smoking status: Former    Current packs/day: 0.00    Average packs/day: 0.3 packs/day for 2.0 years (0.5 ttl pk-yrs)    Types: Cigarettes    Start date: 2007    Quit date: 2009    Years since quitting: 16.0   Smokeless tobacco: Never  Vaping Use  Vaping status: Never Used  Substance Use Topics   Alcohol use: Yes    Comment: ocassionally   Drug use: No    Review of Systems: Documented in HPI ____________________________________________  PHYSICAL EXAM: VITAL SIGNS: ED Triage Vitals  Encounter Vitals Group     BP 10/01/23 0430 (!) 153/85     Systolic BP Percentile --      Diastolic BP Percentile --      Pulse Rate 10/01/23 0430 94     Resp --      Temp --      Temp src --      SpO2 10/01/23 0430 98 %     Weight 10/01/23 0434 243 lb (110.2 kg)     Height 10/01/23 0434 5\' 7"  (1.702 m)     Head Circumference --      Peak Flow --      Pain Score 10/01/23 0433 9     Pain Loc --      Pain Education --      Exclude from Growth Chart --    Physical Exam Vitals and nursing note reviewed.  Constitutional:       Appearance: She is well-developed.  HENT:     Head: Normocephalic and atraumatic.  Cardiovascular:     Rate and Rhythm: Normal rate and regular rhythm.  Pulmonary:     Effort: No respiratory distress.     Breath sounds: No stridor.  Abdominal:     General: There is no distension.     Tenderness: There is abdominal tenderness in the right upper quadrant and right lower quadrant. There is guarding. There is no rebound. Positive signs include Rovsing's sign.  Musculoskeletal:     Cervical back: Normal range of motion.  Neurological:     Mental Status: She is alert.       ____________________________________________   LABS (all labs ordered are listed, but only abnormal results are displayed)  Labs Reviewed  CBC WITH DIFFERENTIAL/PLATELET - Abnormal; Notable for the following components:      Result Value   WBC 15.7 (*)    Platelets 124 (*)    Neutro Abs 11.8 (*)    All other components within normal limits  COMPREHENSIVE METABOLIC PANEL - Abnormal; Notable for the following components:   Sodium 134 (*)    Potassium 2.6 (*)    Glucose, Bld 108 (*)    All other components within normal limits  URINALYSIS, W/ REFLEX TO CULTURE (INFECTION SUSPECTED) - Abnormal; Notable for the following components:   Hgb urine dipstick SMALL (*)    Leukocytes,Ua TRACE (*)    All other components within normal limits  MAGNESIUM  - Abnormal; Notable for the following components:   Magnesium  1.6 (*)    All other components within normal limits  URINE CULTURE  LIPASE, BLOOD   ____________________________________________  EKG   EKG Interpretation Date/Time:    Ventricular Rate:    PR Interval:    QRS Duration:    QT Interval:    QTC Calculation:   R Axis:      Text Interpretation:          ____________________________________________  RADIOLOGY  CT ABDOMEN PELVIS W CONTRAST Result Date: 10/01/2023 CLINICAL DATA:  68 year old female with history of right lower quadrant  abdominal pain. Diarrhea. EXAM: CT ABDOMEN AND PELVIS WITH CONTRAST TECHNIQUE: Multidetector CT imaging of the abdomen and pelvis was performed using the standard protocol following bolus administration of intravenous contrast. RADIATION DOSE REDUCTION: This exam  was performed according to the departmental dose-optimization program which includes automated exposure control, adjustment of the mA and/or kV according to patient size and/or use of iterative reconstruction technique. CONTRAST:  OMNIPAQUE  IOHEXOL  300 MG/ML  SOLN COMPARISON:  CT of the abdomen and pelvis 07/18/2020. FINDINGS: Lower chest: Unremarkable. Hepatobiliary: Multiple low-attenuation lesions are scattered throughout the liver, largest of which are compatible with simple cysts, measuring up to 1.9 x 1.3 cm in the periphery of the right lobe of the liver (axial image 20 of series 2). Other smaller lesions are too small to definitively characterize, but appear similar to prior studies and statistically likely to represent tiny cysts and/or biliary hamartomas (no imaging follow-up for any of these lesions is recommended). No aggressive appearing hepatic lesions. No intra or extrahepatic biliary ductal dilatation. Gallbladder is unremarkable in appearance. Pancreas: No pancreatic mass. No pancreatic ductal dilatation. No pancreatic or peripancreatic fluid collections or inflammatory changes. Spleen: Unremarkable. Adrenals/Urinary Tract: Nonobstructive calculi measuring 2-3 mm in the right renal collecting system. Bilateral kidneys and adrenal glands are otherwise normal in appearance. No hydroureteronephrosis. Urinary bladder is nearly decompressed, but otherwise unremarkable in appearance. Stomach/Bowel: The appearance of the stomach is normal. Numerous colonic diverticuli are noted, without definite focal surrounding inflammatory changes to indicate an acute diverticulitis at this time. The appendix is dilated and inflamed, indicative of acute  appendicitis. Appendix: Location: Inferior to the cecum Diameter: Up to 15 mm Appendicolith: None Mucosal hyper-enhancement: Present Extraluminal gas: None Periappendiceal collection: Extensive periappendiceal soft tissue stranding and trace volume of fluid. No well organized periappendiceal fluid collection to suggest abscess at this time. Vascular/Lymphatic: Atherosclerosis in the abdominal aorta and pelvic vasculature. No lymphadenopathy noted in the abdomen or pelvis. Reproductive: Uterus is enlarged and heterogeneous in appearance with multiple heterogeneously enhancing lesions compatible with multifocal fibroids, largest of which measures approximately 6.8 x 4.9 cm in the right side of the uterine body. Ovaries are unremarkable in appearance. Other: No significant volume of ascites.  No pneumoperitoneum. Musculoskeletal: There are no aggressive appearing lytic or blastic lesions noted in the visualized portions of the skeleton. IMPRESSION: 1. Acute appendicitis with extensive periappendiceal inflammatory changes but no definite periappendiceal abscess confidently identified at this time. Surgical consultation is strongly recommended. 2. Extensive colonic diverticulosis without definite evidence of acute diverticulitis at this time. 3. 2-3 mm nonobstructive calculi in the right renal collecting system. 4. Aortic atherosclerosis. 5. Additional incidental findings, as above. Electronically Signed   By: Alexandria Angel M.D.   On: 10/01/2023 06:07   ____________________________________________  PROCEDURES  Procedure(s) performed:   Procedures ____________________________________________  INITIAL IMPRESSION / ASSESSMENT AND PLAN   This patient presents to the ED for concern of abdominal pain, this involves an extensive number of treatment options, and is a complaint that carries with it a high risk of complications and morbidity.  Based on exam, history of present illness I think the most likely  etiology is appendicitis versus cholecystitis, however am considering bowel obstruction, colitis, diverticulitis, bowel perforation, peptic ulcer disease as well but thought to be less likely based on H&P and workup to this point.   Additional history obtained:  Additional history obtained from husband at bedside Previous records obtained and reviewed in epic  Co morbidities that complicate the patient evaluation  N/A  Social Determinants of Health:  N/A  Initial Plan:  Symptomatic treatment and CT scan to evaluate for above etiologies. Screening labs including CBC and Metabolic panel to evaluate for infectious or metabolic etiology of  disease.  Urinalysis with reflex culture ordered to evaluate for UTI or relevant urologic/nephrologic pathology.  CXR to evaluate for structural/infectious intrathoracic pathology.  EKG to evaluate for cardiac pathology. Objective evaluation as below reviewed with plan for close reassessment  ED Course  Images ordered viewed and obtained by myself. Agree with Radiology interpretation. Details in ED course.  Labs ordered reviewed by myself as detailed in ED course.  Consultations obtained/considered detailed in ED course.        Cardiac Monitoring:  The patient was maintained on a cardiac monitor.  I personally viewed and interpreted the cardiac monitored which showed an underlying rhythm of: NSR  CRITICAL INTERVENTIONS:  Antibiotics Surgical consultation  Reevaluation:  After the interventions noted above, I reevaluated the patient and found that they have :improved but still having pain with movement and palpation. Improved with holding hand there. CT shows appendicitis with extensive inflammation but no discernible perforation or abscess. Abx given. NPO. PRN pain meds ordered. Surgery consulted.   Dr. Collene Dawson will eval and decide on dispo.   Bridges to admit and take to the OR later this morning.  Pain tolerable at this time.  FINAL  IMPRESSION AND PLAN Final diagnoses:  Acute appendicitis with localized peritonitis, without perforation or gangrene, unspecified whether abscess present     Medical screening exam was performed and I feel the patient has had appropriate emergency department evaluation and work-up for their chief complaint and is stable for ADMISSION to the hospitalist time.  I discussed with Dr. Collene Dawson with the Surgery service and discussed labs, imaging and other work-up in the emergency room.  They agree to admission for further management and work-up of said condition. ____________________________________________   NEW OUTPATIENT MEDICATIONS STARTED DURING THIS VISIT:  New Prescriptions   No medications on file    Note:  This note was prepared with assistance of Dragon voice recognition software. Occasional wrong-word or sound-a-like substitutions may have occurred due to the inherent limitations of voice recognition software.    Eve Hinders, MD 10/01/23 [phone]

## 2023-10-01 NOTE — Telephone Encounter (Signed)
 done

## 2023-10-01 NOTE — Consult Note (Addendum)
 I was present with the medical student for this service. I personally verified the history of present illness, performed the physical exam, and made the plan for this encounter. I have verified the medical student's documentation and made modifications where appropriately. I have personally documented in my own words a brief history, physical, and plan below.     Patient seen and has acute appendicitis. No signs of abscess or rupture at this time. Some significant stranding. Reviewed CT and showed patient. Also with diastasis and small 1cm hernia central to the diastasis, should be able to stay away from this area with surgery.   On oral semaglutide  and discussed with anesthesia, they will RSI.   Discussed the risk of laparoscopic appendectomy and the option of antibiotics alone. Discussed that in [place] and some trials in the [place] , antibiotics are used for simple appendicitis. Discussed that research shows a 40% failure rate for antibiotics alone.  Discussed risk of surgery including but not limited to bleeding, infection, injury to other organs, normal appendix, and after this discussion the patient has decided to proceed with surgery.     Reason for Consult: Acute Appendicitis Referring Physician: Eve Hinders, MD- emergency Department  Jill Banks is an 68 y.o. female w/ PMHx T2DM, HLD, HTN, OSA, non-alcoholic hepatic steatosis, chronic thrombocytopenia who presents to the ED for lower abdominal pain since Sunday that has increased in severity and localized to the RLQ with time. She notes having decreased appetite, last meal being a small amount of Chicken Noodle Soup yesterday. Her last bowel movement was Sunday, but she is passing flatus. The patient denies nausea, vomiting, or subjective fever, but confirms having some chills. Taking ibuprofen  800 mg did not help the pain, she has only taken 3 pills since the pain onset. Her surgical history includes total right knee replacement (02/14/23) and  left knee replacement in 2020, no abdominal surgeries. No sick contacts. She is not on a blood thinner. Her chronic thrombocytopenia has been present for two years and is idiopathic and stable. She takes an oral semaglutide  each morning for her diabetes. She has had an umbilical hernia for 5-6 years that is stable.  Past Medical History:  Diagnosis Date   Anxiety    Arthritis    Blood dyscrasia    Thrombocytopenia   Bruises easily    COVID-19 04/2019   Diabetes mellitus without complication (HCC)    Headache    HLD (hyperlipidemia)    Hypertension    Leg pain    Pneumonia    about 5 yrs ago   Sleep apnea    no CPAP    Past Surgical History:  Procedure Laterality Date   APPLICATION OF WOUND VAC 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   Procedure: APPLICATION OF WOUND VAC;  Surgeon: Molli Angelucci, MD;  Location: [facility] ORS;  Service: Orthopedics;  Laterality: Left;  #[id number]   COLONOSCOPY WITH PROPOFOL  N/A 09/25/2021   Procedure: COLONOSCOPY WITH PROPOFOL ;  Surgeon: Selena Daily, MD;  Location: [facility] ENDOSCOPY;  Service: Gastroenterology;  Laterality: N/A;   DILATATION & CURETTAGE/HYSTEROSCOPY WITH MYOSURE  10/17/2020   Procedure: DILATATION & CURETTAGE/HYSTEROSCOPY WITH MYOSURE POLYPECTOMY;  Surgeon: Heron Lord, MD;  Location: [facility] ORS;  Service: Gynecology;;   DILATION AND CURETTAGE OF UTERUS     x 3   TOE SURGERY Left    second toe   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   Procedure: LEFT TOTAL KNEE ARTHROPLASTY;  Surgeon: Molli Angelucci, MD;  Location: [facility] ORS;  Service: Orthopedics;  Laterality: Left;   TOTAL KNEE ARTHROPLASTY Right 02/14/2023   Procedure: RIGHT TOTAL KNEE ARTHROPLASTY;  Surgeon: Arnie Lao, MD;  Location: [facility] ORS;  Service: Orthopedics;  Laterality: Right;    Family History  Problem Relation Age of Onset   Hypertension Father    Stroke Father    Hypertension Brother    Stroke Brother    Hyperlipidemia Brother    Breast cancer Maternal Aunt     Asthma Son     Social History:  reports that she quit smoking about 16 years ago. Her smoking use included cigarettes. She started smoking about 18 years ago. She has a 0.5 pack-year smoking history. She has never used smokeless tobacco. She reports current alcohol use. She reports that she does not use drugs.  Allergies: No Known Allergies  Medications: I have reviewed the patient's current medications. Current Facility-Administered Medications  Medication Dose Route Frequency Provider Last Rate Last Admin   cefoTEtan  (CEFOTAN ) 2 g in sodium chloride  0.9 % 100 mL IVPB  2 g Intravenous On Call to OR Awilda Bogus, MD       chlorhexidine  (PERIDEX ) 0.12 % solution            potassium chloride  10 mEq in 100 mL IVPB  10 mEq Intravenous Q1 Hr x 2 Awilda Bogus, MD 100 mL/hr at 10/01/23 0935 10 mEq at 10/01/23 0935   Current Outpatient Medications  Medication Sig Dispense Refill Last Dose/Taking   Accu-Chek Softclix Lancets lancets Use as instructed to check blood sugars once daily 300 each 3    amLODipine  (NORVASC ) 5 MG tablet Take 1 tablet (5 mg total) by mouth daily. 90 tablet 3    bisoprolol  (ZEBETA ) 10 MG tablet Take 1 tablet (10 mg total) by mouth daily. 90 tablet 3    Blood Glucose Monitoring Suppl (ONETOUCH VERIO FLEX SYSTEM) w/Device KIT       calcitRIOL  (ROCALTROL ) 0.25 MCG capsule TAKE (1) CAPSULE BY MOUTH ONCE DAILY. 90 capsule 5    chlorthalidone  (HYGROTON ) 25 MG tablet Take 1 tablet (25 mg total) by mouth daily. 90 tablet 1    cyanocobalamin  (VITAMIN B12) 1000 MCG tablet Take 1 tablet (1,000 mcg total) by mouth daily. 90 tablet 1    diclofenac sodium (VOLTAREN) 1 % GEL Apply 2 g topically 4 (four) times daily as needed (knee pain).      glucose blood test strip Use as instructed to check blood sugars once daily E11.65 300 each 3    naproxen sodium (ALEVE) 220 MG tablet Take 440 mg by mouth 2 (two) times daily as needed (pain).      NON FORMULARY cpap device       potassium chloride  SA (KLOR-CON  M) 20 MEQ tablet TAKE 1 TABLET BY MOUTH TWICE DAILY 180 tablet 11    Semaglutide  (RYBELSUS ) 7 MG TABS Take 1 tablet (7 mg total) by mouth daily. 90 tablet 4     Results for orders placed or performed during the hospital encounter of 10/01/23 (from the past 48 hours)  Urinalysis, w/ Reflex to Culture (Infection Suspected) -Urine, Clean Catch     Status: Abnormal   Collection Time: 10/01/23  4:39 AM  Result Value Ref Range   Specimen Source URINE, CLEAN CATCH    Color, Urine YELLOW YELLOW   APPearance CLEAR CLEAR   Specific Gravity, Urine 1.019 1.005 - 1.030   pH 5.0 5.0 - 8.0   Glucose, UA NEGATIVE NEGATIVE mg/dL   Hgb urine dipstick SMALL (  A) NEGATIVE   Bilirubin Urine NEGATIVE NEGATIVE   Ketones, ur NEGATIVE NEGATIVE mg/dL   Protein, ur NEGATIVE NEGATIVE mg/dL   Nitrite NEGATIVE NEGATIVE   Leukocytes,Ua TRACE (A) NEGATIVE   RBC / HPF 0-5 0 - 5 RBC/hpf   WBC, UA 11-20 0 - 5 WBC/hpf    Comment:        Reflex urine culture not performed if WBC <=10, OR if Squamous epithelial cells >5. If Squamous epithelial cells >5 suggest recollection.    Bacteria, UA NONE SEEN NONE SEEN   Squamous Epithelial / HPF 0-5 0 - 5 /HPF   Mucus PRESENT     Comment: Performed at [facility], [address], [place], [place] [address]  CBC with Differential     Status: Abnormal   Collection Time: 10/01/23  4:59 AM  Result Value Ref Range   WBC 15.7 (H) 4.0 - 10.5 K/uL    Comment: WHITE COUNT CONFIRMED ON SMEAR   RBC 4.92 3.87 - 5.11 MIL/uL   Hemoglobin 13.8 12.0 - 15.0 g/dL   HCT 60.4 54.0 - 98.1 %   MCV 83.7 80.0 - 100.0 fL   MCH 28.0 26.0 - 34.0 pg   MCHC 33.5 30.0 - 36.0 g/dL   RDW 19.1 47.8 - 29.5 %   Platelets 124 (L) 150 - 400 K/uL   nRBC 0.0 0.0 - 0.2 %   Neutrophils Relative % 75 %   Neutro Abs 11.8 (H) 1.7 - 7.7 K/uL   Lymphocytes Relative 19 %   Lymphs Abs 3.0 0.7 - 4.0 K/uL   Monocytes Relative 5 %   Monocytes Absolute 0.8 0.1 - 1.0 K/uL    Eosinophils Relative 1 %   Eosinophils Absolute 0.2 0.0 - 0.5 K/uL   Basophils Relative 0 %   Basophils Absolute 0.0 0.0 - 0.1 K/uL   WBC Morphology MORPHOLOGY UNREMARKABLE    RBC Morphology MORPHOLOGY UNREMARKABLE    Smear Review See Note     Comment: LARGE PLATELETS   Abs Immature Granulocytes 0.00 0.00 - 0.07 K/uL    Comment: Performed at [facility], [address], [place], [place] [address]  Comprehensive metabolic panel     Status: Abnormal   Collection Time: 10/01/23  4:59 AM  Result Value Ref Range   Sodium 134 (L) 135 - 145 mmol/L   Potassium 2.6 (LL) 3.5 - 5.1 mmol/L    Comment: CRITICAL RESULT CALLED TO, READ BACK BY AND VERIFIED WITH TALBOT,T AT 5:25AM ON 10/01/23 BY FESTERMAN,C   Chloride 98 98 - 111 mmol/L   CO2 27 22 - 32 mmol/L   Glucose, Bld 108 (H) 70 - 99 mg/dL    Comment: Glucose reference range applies only to samples taken after fasting for at least 8 hours.   BUN 12 8 - 23 mg/dL   Creatinine, Ser 8.65 0.44 - 1.00 mg/dL   Calcium  9.3 8.9 - 10.3 mg/dL   Total Protein 7.0 6.5 - 8.1 g/dL   Albumin 3.5 3.5 - 5.0 g/dL   AST 15 15 - 41 U/L   ALT 11 0 - 44 U/L   Alkaline Phosphatase 47 38 - 126 U/L   Total Bilirubin 1.2 0.0 - 1.2 mg/dL   GFR, Estimated >78 >46 mL/min    Comment: (NOTE) Calculated using the CKD-EPI Creatinine Equation (2021)    Anion gap 9 5 - 15    Comment: Performed at [facility], [address], [place], [place] [address]  Lipase, blood  Status: None   Collection Time: 10/01/23  4:59 AM  Result Value Ref Range   Lipase 24 11 - 51 U/L    Comment: Performed at [facility], [address], [place], [place] [address]  Magnesium      Status: Abnormal   Collection Time: 10/01/23  4:59 AM  Result Value Ref Range   Magnesium  1.6 (L) 1.7 - 2.4 mg/dL    Comment: Performed at [facility], [address] [place], [place] [address]    CT ABDOMEN PELVIS W CONTRAST Result Date: 10/01/2023 CLINICAL DATA:  68 year old female with history of right  lower quadrant abdominal pain. Diarrhea. EXAM: CT ABDOMEN AND PELVIS WITH CONTRAST TECHNIQUE: Multidetector CT imaging of the abdomen and pelvis was performed using the standard protocol following bolus administration of intravenous contrast. RADIATION DOSE REDUCTION: This exam was performed according to the departmental dose-optimization program which includes automated exposure control, adjustment of the mA and/or kV according to patient size and/or use of iterative reconstruction technique. CONTRAST:  OMNIPAQUE  IOHEXOL  300 MG/ML  SOLN COMPARISON:  CT of the abdomen and pelvis 07/18/2020. FINDINGS: Lower chest: Unremarkable. Hepatobiliary: Multiple low-attenuation lesions are scattered throughout the liver, largest of which are compatible with simple cysts, measuring up to 1.9 x 1.3 cm in the periphery of the right lobe of the liver (axial image 20 of series 2). Other smaller lesions are too small to definitively characterize, but appear similar to prior studies and statistically likely to represent tiny cysts and/or biliary hamartomas (no imaging follow-up for any of these lesions is recommended). No aggressive appearing hepatic lesions. No intra or extrahepatic biliary ductal dilatation. Gallbladder is unremarkable in appearance. Pancreas: No pancreatic mass. No pancreatic ductal dilatation. No pancreatic or peripancreatic fluid collections or inflammatory changes. Spleen: Unremarkable. Adrenals/Urinary Tract: Nonobstructive calculi measuring 2-3 mm in the right renal collecting system. Bilateral kidneys and adrenal glands are otherwise normal in appearance. No hydroureteronephrosis. Urinary bladder is nearly decompressed, but otherwise unremarkable in appearance. Stomach/Bowel: The appearance of the stomach is normal. Numerous colonic diverticuli are noted, without definite focal surrounding inflammatory changes to indicate an acute diverticulitis at this time. The appendix is dilated and inflamed,  indicative of acute appendicitis. Appendix: Location: Inferior to the cecum Diameter: Up to 15 mm Appendicolith: None Mucosal hyper-enhancement: Present Extraluminal gas: None Periappendiceal collection: Extensive periappendiceal soft tissue stranding and trace volume of fluid. No well organized periappendiceal fluid collection to suggest abscess at this time. Vascular/Lymphatic: Atherosclerosis in the abdominal aorta and pelvic vasculature. No lymphadenopathy noted in the abdomen or pelvis. Reproductive: Uterus is enlarged and heterogeneous in appearance with multiple heterogeneously enhancing lesions compatible with multifocal fibroids, largest of which measures approximately 6.8 x 4.9 cm in the right side of the uterine body. Ovaries are unremarkable in appearance. Other: No significant volume of ascites.  No pneumoperitoneum. Musculoskeletal: There are no aggressive appearing lytic or blastic lesions noted in the visualized portions of the skeleton. IMPRESSION: 1. Acute appendicitis with extensive periappendiceal inflammatory changes but no definite periappendiceal abscess confidently identified at this time. Surgical consultation is strongly recommended. 2. Extensive colonic diverticulosis without definite evidence of acute diverticulitis at this time. 3. 2-3 mm nonobstructive calculi in the right renal collecting system. 4. Aortic atherosclerosis. 5. Additional incidental findings, as above. Electronically Signed   By: Alexandria Angel M.D.   On: 10/01/2023 06:07    ROS:  Pertinent items noted in HPI and remainder of comprehensive ROS otherwise negative.  Blood pressure (!) 147/93, pulse 88, temperature 98.6  F (37 C), temperature source Oral, resp. rate 18, height 5\' 7"  (1.702 m), weight 110.2 kg, SpO2 97%.  Physical Exam HENT:     Head: Normocephalic.     Comments: Hearing is grossly normal Eyes:     Extraocular Movements: Extraocular movements intact.     Comments: Eye movements grossly  normal  Cardiovascular:     Rate and Rhythm: Normal rate and regular rhythm.  Pulmonary:     Effort: Pulmonary effort is normal.  Abdominal:     General: There is no distension.     Tenderness: There is abdominal tenderness in the right lower quadrant, suprapubic area and left lower quadrant. Positive signs include Rovsing's sign.     Hernia: A hernia is present. Hernia is present in the umbilical area (tiny, reducible).     Comments: Pain to RLQ elicited by tapping right heel  Skin:    General: Skin is warm and dry.     Capillary Refill: Capillary refill takes less than 2 seconds.  Neurological:     General: No focal deficit present.     Mental Status: She is alert and oriented to person, place, and time.  Psychiatric:        Mood and Affect: Mood normal.        Behavior: Behavior normal.      Assessment/Plan: Jill Banks is an 68 y.o. female w/ PMHx T2DM, HLD, HTN, OSA, non-alcoholic hepatic steatosis, chronic thrombocytopenia who presented to the ED for lower, mainly right-sided, abdominal pain, decreased appetite, and chills for 3 days who was found to have appendicitis on CT.  Acute Appendicitis WBC 15.7, RLQ tenderness to palpation, pain elicited with right heel tap, CT showed 15 mm in diameter appendix with surrounding inflammatory changes. Her potassium and magnesium  are low, but being treated as explained below. Not on blood thinners. Oral semaglutide  daily. We will go forward with appendectomy today. - Preop EKG - Cefotan  2g - Replete electrolytes - Robotic Lap appendectomy today  Hypokalemia  Hypomagnesemia K+ 2.6, Mg++ 1.6. Both being repleted. She will have received 40 mEq of IV K+ and 2g IV magnesium  prior to operation. 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 Platelets 124k today, which is baseline for at least one year. No concerns for acute drop, no signs of bleeding.  Kari Otto  Garr 10/01/2023, 7:07 AM

## 2023-10-02 DIAGNOSIS — Z83438 Family history of other disorder of lipoprotein metabolism and other lipidemia: Secondary | ICD-10-CM | POA: Diagnosis not present

## 2023-10-02 DIAGNOSIS — K429 Umbilical hernia without obstruction or gangrene: Secondary | ICD-10-CM | POA: Diagnosis present

## 2023-10-02 DIAGNOSIS — Z8249 Family history of ischemic heart disease and other diseases of the circulatory system: Secondary | ICD-10-CM | POA: Diagnosis not present

## 2023-10-02 DIAGNOSIS — Z825 Family history of asthma and other chronic lower respiratory diseases: Secondary | ICD-10-CM | POA: Diagnosis not present

## 2023-10-02 DIAGNOSIS — E876 Hypokalemia: Secondary | ICD-10-CM | POA: Diagnosis present

## 2023-10-02 DIAGNOSIS — G4733 Obstructive sleep apnea (adult) (pediatric): Secondary | ICD-10-CM | POA: Diagnosis present

## 2023-10-02 DIAGNOSIS — E785 Hyperlipidemia, unspecified: Secondary | ICD-10-CM | POA: Diagnosis present

## 2023-10-02 DIAGNOSIS — K760 Fatty (change of) liver, not elsewhere classified: Secondary | ICD-10-CM | POA: Diagnosis present

## 2023-10-02 DIAGNOSIS — D693 Immune thrombocytopenic purpura: Secondary | ICD-10-CM | POA: Diagnosis present

## 2023-10-02 DIAGNOSIS — Z803 Family history of malignant neoplasm of breast: Secondary | ICD-10-CM | POA: Diagnosis not present

## 2023-10-02 DIAGNOSIS — E119 Type 2 diabetes mellitus without complications: Secondary | ICD-10-CM | POA: Diagnosis present

## 2023-10-02 DIAGNOSIS — Z96653 Presence of artificial knee joint, bilateral: Secondary | ICD-10-CM | POA: Diagnosis present

## 2023-10-02 DIAGNOSIS — K3580 Unspecified acute appendicitis: Secondary | ICD-10-CM | POA: Diagnosis present

## 2023-10-02 DIAGNOSIS — K3533 Acute appendicitis with perforation and localized peritonitis, with abscess: Secondary | ICD-10-CM | POA: Diagnosis present

## 2023-10-02 DIAGNOSIS — I10 Essential (primary) hypertension: Secondary | ICD-10-CM | POA: Diagnosis present

## 2023-10-02 DIAGNOSIS — Z823 Family history of stroke: Secondary | ICD-10-CM | POA: Diagnosis not present

## 2023-10-02 DIAGNOSIS — Z79899 Other long term (current) drug therapy: Secondary | ICD-10-CM | POA: Diagnosis not present

## 2023-10-02 DIAGNOSIS — Z87891 Personal history of nicotine dependence: Secondary | ICD-10-CM | POA: Diagnosis not present

## 2023-10-02 DIAGNOSIS — Z8616 Personal history of COVID-19: Secondary | ICD-10-CM | POA: Diagnosis not present

## 2023-10-02 LAB — HIV ANTIBODY (ROUTINE TESTING W REFLEX): HIV Screen 4th Generation wRfx: NONREACTIVE

## 2023-10-02 LAB — GLUCOSE, CAPILLARY
Glucose-Capillary: 110 mg/dL — ABNORMAL HIGH (ref 70–99)
Glucose-Capillary: 117 mg/dL — ABNORMAL HIGH (ref 70–99)
Glucose-Capillary: 120 mg/dL — ABNORMAL HIGH (ref 70–99)
Glucose-Capillary: 157 mg/dL — ABNORMAL HIGH (ref 70–99)

## 2023-10-02 LAB — BASIC METABOLIC PANEL
Anion gap: 10 (ref 5–15)
BUN: 10 mg/dL (ref 8–23)
CO2: 29 mmol/L (ref 22–32)
Calcium: 8.4 mg/dL — ABNORMAL LOW (ref 8.9–10.3)
Chloride: 96 mmol/L — ABNORMAL LOW (ref 98–111)
Creatinine, Ser: 0.92 mg/dL (ref 0.44–1.00)
GFR, Estimated: >60 mL/min (ref >60)
Glucose, Bld: 113 mg/dL — ABNORMAL HIGH (ref 70–99)
Potassium: 2.7 mmol/L — CL (ref 3.5–5.1)
Sodium: 135 mmol/L (ref 135–145)

## 2023-10-02 LAB — CBC
HCT: 40.8 % (ref 36.0–46.0)
Hemoglobin: 13.6 g/dL (ref 12.0–15.0)
MCH: 28.8 pg (ref 26.0–34.0)
MCHC: 33.3 g/dL (ref 30.0–36.0)
MCV: 86.4 fL (ref 80.0–100.0)
Platelets: 114 10*3/uL — ABNORMAL LOW (ref 150–400)
RBC: 4.72 MIL/uL (ref 3.87–5.11)
RDW: 15.4 % (ref 11.5–15.5)
WBC: 14.4 10*3/uL — ABNORMAL HIGH (ref 4.0–10.5)
nRBC: 0 % (ref 0.0–0.2)

## 2023-10-02 LAB — URINE CULTURE: Culture: NO GROWTH

## 2023-10-02 LAB — SURGICAL PATHOLOGY

## 2023-10-02 MED ORDER — POTASSIUM CHLORIDE CRYS ER 20 MEQ PO TBCR
20.0000 meq | EXTENDED_RELEASE_TABLET | Freq: Two times a day (BID) | ORAL | Status: DC
Start: 2023-10-03 — End: 2023-10-04
  Administered 2023-10-03 – 2023-10-04 (×3): 20 meq via ORAL
  Filled 2023-10-02 (×3): qty 1

## 2023-10-02 MED ORDER — OXYCODONE HCL 5 MG PO TABS: 5.0000 mg | ORAL_TABLET | ORAL | 0 refills | Status: DC | PRN

## 2023-10-02 MED ORDER — ONDANSETRON 4 MG PO TBDP: 4.0000 mg | ORAL_TABLET | Freq: Four times a day (QID) | ORAL | 0 refills | Status: DC | PRN

## 2023-10-02 MED ORDER — POTASSIUM CHLORIDE CRYS ER 20 MEQ PO TBCR: 20.0000 meq | EXTENDED_RELEASE_TABLET | Freq: Two times a day (BID) | ORAL | Status: DC

## 2023-10-02 MED ORDER — AMOXICILLIN-POT CLAVULANATE 875-125 MG PO TABS: 1.0000 | ORAL_TABLET | Freq: Two times a day (BID) | ORAL | 0 refills | Status: AC

## 2023-10-02 MED ORDER — MAGNESIUM SULFATE 2 GM/50ML IV SOLN
2.0000 g | Freq: Once | INTRAVENOUS | Status: AC
  Administered 2023-10-02: 2 g via INTRAVENOUS
  Filled 2023-10-02: qty 50

## 2023-10-02 MED ORDER — DOCUSATE SODIUM 100 MG PO CAPS: 100.0000 mg | ORAL_CAPSULE | Freq: Two times a day (BID) | ORAL | 0 refills | Status: DC | PRN

## 2023-10-02 MED ORDER — POTASSIUM CHLORIDE 20 MEQ PO PACK
40.0000 meq | PACK | ORAL | Status: AC
  Administered 2023-10-02 (×2): 40 meq via ORAL
  Filled 2023-10-02 (×2): qty 2

## 2023-10-02 NOTE — Care Management Obs Status (Signed)
MEDICARE OBSERVATION STATUS NOTIFICATION   Patient Details  Name: Jill Banks MRN: [id number] Date of Birth: 1955/11/30   Medicare Observation Status Notification Given:  Yes Pearletha Furl., CMA, verbally reviewed observation notice with [name]. Copy provided.)    Corey Harold 10/02/2023, 10:24 AM

## 2023-10-02 NOTE — Plan of Care (Signed)

## 2023-10-02 NOTE — Progress Notes (Signed)
Mobility Specialist Progress Note:    10/02/23 1410  Mobility  Activity Ambulated with assistance in hallway  Level of Assistance Contact guard assist, steadying assist  Assistive Device None  Distance Ambulated (ft) 150 ft  Range of Motion/Exercises Active;All extremities  Activity Response Tolerated well  Mobility Referral Yes  Mobility visit 1 Mobility  Mobility Specialist Start Time (ACUTE ONLY) 1355  Mobility Specialist Stop Time (ACUTE ONLY) 1415  Mobility Specialist Time Calculation (min) (ACUTE ONLY) 20 min   Pt received in bed, eager for mobility. Required CGA to stand and ambulate with no AD. Tolerated well, asx throughout. Returned to room, left pt supine. All needs met.   Lawerance Bach Mobility Specialist Please contact via Special educational needs teacher or  Rehab office at [phone]

## 2023-10-02 NOTE — Discharge Instructions (Signed)
Discharge Robotic Assisted Laparoscopic Surgery Instructions:  Take your antibiotics as prescribed.   Common Complaints: Right shoulder pain is common after laparoscopic surgery.  This is secondary to the gas used in the surgery being trapped under the diaphragm.  Walk to help your body absorb the gas. This will improve in a few days. Pain at the port sites are common, especially the larger port sites. This will improve with time.  Some nausea is common and poor appetite. The main goal is to stay hydrated the first few days after surgery.   Diet/ Activity: Diet as tolerated. You may not have an appetite, but it is important to stay hydrated.  Drink 64 ounces of water a day. Your appetite will return with time.  Shower per your regular routine daily.  Do not take hot showers. Take warm showers that are less than 10 minutes. Rest and listen to your body, but do not remain in bed all day.  Walk everyday for at least 15-20 minutes. Deep cough and move around every 1-2 hours in the first few days after surgery.  Do not lift > 10 lbs, perform excessive bending, pushing, pulling, squatting for 1-2 weeks after surgery.  Do not pick at the dermabond glue on your incision sites.  This glue film will remain in place for 1-2 weeks and will start to peel off.  Do not place lotions or balms on your incision unless instructed to specifically by Dr. Henreitta Leber.   Pain Expectations and Narcotics: -After surgery you will have pain associated with your incisions and this is normal. The pain is muscular and nerve pain, and will get better with time. -You are encouraged and expected to take non narcotic medications like tylenol and ibuprofen (when able) to treat pain as multiple modalities can aid with pain treatment. -Narcotics are only used when pain is severe or there is breakthrough pain. -You are not expected to have a pain score of 0 after surgery, as we cannot prevent pain. A pain score of 3-4 that allows  you to be functional, move, walk, and tolerate some activity is the goal. The pain will continue to improve over the days after surgery and is dependent on your surgery. -Due to [place] law, we are only able to give a certain amount of pain medication to treat post operative pain, and we only give additional narcotics on a patient by patient basis.  -For most laparoscopic surgery, studies have shown that the majority of patients only need 10-15 narcotic pills, and for open surgeries most patients only need 15-20.   -Having appropriate expectations of pain and knowledge of pain management with non narcotics is important as we do not want anyone to become addicted to narcotic pain medication.  -Using ice packs in the first 48 hours and heating pads after 48 hours, wearing an abdominal binder (when recommended), and using over the counter medications are all ways to help with pain management.   -Simple acts like meditation and mindfulness practices after surgery can also help with pain control and research has proven the benefit of these practices.  Medication: Take tylenol and ibuprofen as needed for pain control, alternating every 4-6 hours.  Example:  Tylenol 1000mg  @ 6am, 12noon, 6pm, (Do not exceed 4000mg  of tylenol a day). Ibuprofen 800mg  @ 9am, 3pm, 9pm, 3am (Do not exceed 3600mg  of ibuprofen a day).  Take Roxicodone for breakthrough pain every 4 hours.  Take Colace for constipation related to narcotic pain medication. If you  do not have a bowel movement in 2 days, take Miralax over the counter.  Drink plenty of water to also prevent constipation.   Contact Information: If you have questions or concerns, please call our office, [phone], Monday- Thursday 8AM-5PM and Friday 8AM-12Noon.  If it is after hours or on the weekend, please call [facility], [phone], [phone], and ask to speak to the surgeon on call for Dr. Henreitta Leber at [facility].

## 2023-10-02 NOTE — Progress Notes (Signed)
1 Day Post-Op  Subjective: Jill Banks is an 68 y.o. female w/ PMHx T2DM, HLD, HTN, OSA, non-alcoholic hepatic steatosis, chronic thrombocytopenia who is day 1 s/p appendectomy for acute perforated appendicitis. She says that she feels bloated and has pain, mainly to the lower abdomen. She has passed flatus, but not had a bowel movement. Her pain improved with passing flatus. The patient has felt febrile as well.  Objective: Vital signs in last 24 hours: Temp:  [98.5 F (36.9 C)-103 F (39.4 C)] 100.2 F (37.9 C) (01/16 0540) Pulse Rate:  [82-102] 96 (01/16 0540) Resp:  [9-24] 20 (01/16 0540) BP: (85-163)/(58-95) 136/58 (01/16 0540) SpO2:  [86 %-100 %] 92 % (01/16 0540) Weight:  [110.2 kg] 110.2 kg (01/15 1021) Last BM Date : 09/28/23  Intake/Output from previous day: 01/15 0701 - 01/16 0700 In: 2213.8 [P.O.:240; I.V.:800; IV Piggyback:1173.8] Out: 325 [Urine:325] Intake/Output this shift: No intake/output data recorded.  Physical Exam Constitutional:      General: She is not in acute distress.    Appearance: She is well-developed.  Cardiovascular:     Rate and Rhythm: Normal rate and regular rhythm.  Pulmonary:     Effort: Pulmonary effort is normal.     Breath sounds: Normal breath sounds.  Abdominal:     General: Bowel sounds are normal.     Palpations: There is no fluid wave.     Tenderness: There is abdominal tenderness (diffuse lower abdomen). There is no guarding.     Comments: Moderate tenderness primarily to the lower abdomen, more on the right side. Tympany and pain to percussion diffusely, more in the lower abdomen.  Neurological:     Mental Status: She is alert.      Lab Results:  Recent Labs    10/01/23 0459 10/02/23 0432  WBC 15.7* 14.4*  HGB 13.8 13.6  HCT 41.2 40.8  PLT 124* 114*   BMET Recent Labs    10/01/23 0459 10/02/23 0432  NA 134* 135  K 2.6* 2.7*  CL 98 96*  CO2 27 29  GLUCOSE 108* 113*  BUN 12 10  CREATININE 0.74 0.92   CALCIUM 9.3 8.4*   PT/INR No results for input(s): "LABPROT", "INR" in the last 72 hours.  Studies/Results: CT ABDOMEN PELVIS W CONTRAST Result Date: 10/01/2023 CLINICAL DATA:  68 year old female with history of right lower quadrant abdominal pain. Diarrhea. EXAM: CT ABDOMEN AND PELVIS WITH CONTRAST TECHNIQUE: Multidetector CT imaging of the abdomen and pelvis was performed using the standard protocol following bolus administration of intravenous contrast. RADIATION DOSE REDUCTION: This exam was performed according to the departmental dose-optimization program which includes automated exposure control, adjustment of the mA and/or kV according to patient size and/or use of iterative reconstruction technique. CONTRAST:  OMNIPAQUE IOHEXOL 300 MG/ML  SOLN COMPARISON:  CT of the abdomen and pelvis 07/18/2020. FINDINGS: Lower chest: Unremarkable. Hepatobiliary: Multiple low-attenuation lesions are scattered throughout the liver, largest of which are compatible with simple cysts, measuring up to 1.9 x 1.3 cm in the periphery of the right lobe of the liver (axial image 20 of series 2). Other smaller lesions are too small to definitively characterize, but appear similar to prior studies and statistically likely to represent tiny cysts and/or biliary hamartomas (no imaging follow-up for any of these lesions is recommended). No aggressive appearing hepatic lesions. No intra or extrahepatic biliary ductal dilatation. Gallbladder is unremarkable in appearance. Pancreas: No pancreatic mass. No pancreatic ductal dilatation. No pancreatic or peripancreatic fluid collections or  inflammatory changes. Spleen: Unremarkable. Adrenals/Urinary Tract: Nonobstructive calculi measuring 2-3 mm in the right renal collecting system. Bilateral kidneys and adrenal glands are otherwise normal in appearance. No hydroureteronephrosis. Urinary bladder is nearly decompressed, but otherwise unremarkable in appearance. Stomach/Bowel: The  appearance of the stomach is normal. Numerous colonic diverticuli are noted, without definite focal surrounding inflammatory changes to indicate an acute diverticulitis at this time. The appendix is dilated and inflamed, indicative of acute appendicitis. Appendix: Location: Inferior to the cecum Diameter: Up to 15 mm Appendicolith: None Mucosal hyper-enhancement: Present Extraluminal gas: None Periappendiceal collection: Extensive periappendiceal soft tissue stranding and trace volume of fluid. No well organized periappendiceal fluid collection to suggest abscess at this time. Vascular/Lymphatic: Atherosclerosis in the abdominal aorta and pelvic vasculature. No lymphadenopathy noted in the abdomen or pelvis. Reproductive: Uterus is enlarged and heterogeneous in appearance with multiple heterogeneously enhancing lesions compatible with multifocal fibroids, largest of which measures approximately 6.8 x 4.9 cm in the right side of the uterine body. Ovaries are unremarkable in appearance. Other: No significant volume of ascites.  No pneumoperitoneum. Musculoskeletal: There are no aggressive appearing lytic or blastic lesions noted in the visualized portions of the skeleton. IMPRESSION: 1. Acute appendicitis with extensive periappendiceal inflammatory changes but no definite periappendiceal abscess confidently identified at this time. Surgical consultation is strongly recommended. 2. Extensive colonic diverticulosis without definite evidence of acute diverticulitis at this time. 3. 2-3 mm nonobstructive calculi in the right renal collecting system. 4. Aortic atherosclerosis. 5. Additional incidental findings, as above. Electronically Signed   By: Trudie Reed M.D.   On: 10/01/2023 06:07    Anti-infectives: Anti-infectives (From admission, onward)    Start     Dose/Rate Route Frequency Ordered Stop   10/01/23 1700  piperacillin-tazobactam (ZOSYN) IVPB 3.375 g        3.375 g 12.5 mL/hr over 240 Minutes  Intravenous Every 8 hours 10/01/23 1449 10/08/23 1659   10/01/23 1000  cefoTEtan (CEFOTAN) 2 g in sodium chloride 0.9 % 100 mL IVPB        2 g 200 mL/hr over 30 Minutes Intravenous On call to O.R. 10/01/23 0958 10/01/23 1315   10/01/23 0630  piperacillin-tazobactam (ZOSYN) IVPB 3.375 g        3.375 g 100 mL/hr over 30 Minutes Intravenous  Once 10/01/23 0617 10/01/23 0704       Assessment/Plan: s/p Procedure(s): XI ROBOTIC LAPAROSCOPIC ASSISTED APPENDECTOMY  Jill Banks is an 68 y.o. female w/ PMHx T2DM, HLD, HTN, OSA, non-alcoholic hepatic steatosis, chronic thrombocytopenia who presented to the ED for lower, mainly right-sided, abdominal pain, decreased appetite, and chills for 3 days who was found to have appendicitis on CT. She is day 1 post op from appendectomy for perforated appendicitis.   Acute Perforated Appendicitis  S/p Appendectomy WBC 14.4 down from 15.7 prior to surgery, febrile to 103 last night now 100.2. She has tenderness to the lower abdomen, primarily on the right. The patient has tympany to percussion, which I suspect is from insufflated gas during surgery that remained with desufflation. Passed gas, which helped pain. - Continue zosyn - Replete electrolytes - CBC   Hypokalemia  Hypomagnesemia K+ 2.7 this morning, was 2.6 yesterday and received 40 mEq. Mg++ 1.6 yesterday. Will add on 80 mEq IV K and 2g Mg. 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 Platelets 124k today, which is  baseline for at least one year. No concerns for acute drop, no signs of bleeding. -CBC w/ diff tomorrow  Hypertension -Home amlodipine -Home bisoprolol   LOS: 0 days    Kayleen Memos 10/02/2023

## 2023-10-02 NOTE — Plan of Care (Signed)
  Problem: Education: Goal: Knowledge of General Education information will improve Description: Including pain rating scale, medication(s)/side effects and non-pharmacologic comfort measures 10/02/2023 1924 by Wannetta Sender, RN Outcome: Progressing 10/02/2023 1923 by Wannetta Sender, RN Outcome: Progressing   Problem: Clinical Measurements: Goal: Will remain free from infection Outcome: Progressing   Problem: Activity: Goal: Risk for activity intolerance will decrease 10/02/2023 1924 by Wannetta Sender, RN Outcome: Progressing 10/02/2023 1923 by Wannetta Sender, RN Outcome: Progressing

## 2023-10-02 NOTE — Plan of Care (Signed)
  Problem: Education: Goal: Knowledge of General Education information will improve Description: Including pain rating scale, medication(s)/side effects and non-pharmacologic comfort measures Outcome: Progressing   Problem: Activity: Goal: Risk for activity intolerance will decrease Outcome: Progressing   

## 2023-10-03 ENCOUNTER — Encounter (HOSPITAL_COMMUNITY): Payer: Self-pay | Admitting: General Surgery

## 2023-10-03 LAB — GLUCOSE, CAPILLARY
Glucose-Capillary: 110 mg/dL — ABNORMAL HIGH (ref 70–99)
Glucose-Capillary: 111 mg/dL — ABNORMAL HIGH (ref 70–99)
Glucose-Capillary: 114 mg/dL — ABNORMAL HIGH (ref 70–99)
Glucose-Capillary: 93 mg/dL (ref 70–99)

## 2023-10-03 LAB — CBC
HCT: 36.9 % (ref 36.0–46.0)
Hemoglobin: 12.2 g/dL (ref 12.0–15.0)
MCH: 28.4 pg (ref 26.0–34.0)
MCHC: 33.1 g/dL (ref 30.0–36.0)
MCV: 86 fL (ref 80.0–100.0)
Platelets: 111 10*3/uL — ABNORMAL LOW (ref 150–400)
RBC: 4.29 MIL/uL (ref 3.87–5.11)
RDW: 15.3 % (ref 11.5–15.5)
WBC: 12.1 10*3/uL — ABNORMAL HIGH (ref 4.0–10.5)
nRBC: 0 % (ref 0.0–0.2)

## 2023-10-03 LAB — BASIC METABOLIC PANEL
Anion gap: 8 (ref 5–15)
BUN: 9 mg/dL (ref 8–23)
CO2: 29 mmol/L (ref 22–32)
Calcium: 8 mg/dL — ABNORMAL LOW (ref 8.9–10.3)
Chloride: 97 mmol/L — ABNORMAL LOW (ref 98–111)
Creatinine, Ser: 0.8 mg/dL (ref 0.44–1.00)
GFR, Estimated: >60 mL/min (ref >60)
Glucose, Bld: 101 mg/dL — ABNORMAL HIGH (ref 70–99)
Potassium: 2.7 mmol/L — CL (ref 3.5–5.1)
Sodium: 134 mmol/L — ABNORMAL LOW (ref 135–145)

## 2023-10-03 LAB — MAGNESIUM: Magnesium: 2.3 mg/dL (ref 1.7–2.4)

## 2023-10-03 MED ORDER — AMOXICILLIN-POT CLAVULANATE 875-125 MG PO TABS
1.0000 | ORAL_TABLET | Freq: Two times a day (BID) | ORAL | Status: DC
  Administered 2023-10-03 – 2023-10-04 (×3): 1 via ORAL
  Filled 2023-10-03 (×3): qty 1

## 2023-10-03 MED ORDER — POTASSIUM CHLORIDE 10 MEQ/100ML IV SOLN
10.0000 meq | INTRAVENOUS | Status: AC
  Administered 2023-10-03 (×5): 10 meq via INTRAVENOUS
  Filled 2023-10-03 (×5): qty 100

## 2023-10-03 MED ORDER — POLYETHYLENE GLYCOL 3350 17 G PO PACK
17.0000 g | PACK | Freq: Every day | ORAL | Status: DC
  Administered 2023-10-03 – 2023-10-04 (×2): 17 g via ORAL
  Filled 2023-10-03 (×2): qty 1

## 2023-10-03 MED ORDER — SODIUM CHLORIDE 0.9 % IV SOLN: INTRAVENOUS | Status: AC | PRN

## 2023-10-03 NOTE — Care Management Important Message (Signed)
Important Message  Patient Details  Name: Jill Banks MRN: [id number] Date of Birth: 09/08/1956   Important Message Given:  Yes - Medicare IM     Corey Harold 10/03/2023, 1:04 PM

## 2023-10-03 NOTE — Progress Notes (Signed)
2 Days Post-Op  Subjective: Jill Banks is an 68 y.o. female w/ PMHx T2DM, HLD, HTN, OSA, non-alcoholic hepatic steatosis, chronic thrombocytopenia who is day 2 s/p appendectomy for acute perforated appendicitis. The patient feels "much better" than she did yesterday. She ate some potatoes, gravy, and grilled chicken yesterday without discomfort or pain. She has not had a bowel movement, but has passed plenty of flatus which improves her pain. The patient denies nausea, vomiting, subjective fever, chills, cough, or shortness of breath.   Objective: Vital signs in last 24 hours: Temp:  [98.7 F (37.1 C)-102 F (38.9 C)] 99.1 F (37.3 C) (01/17 0456) Pulse Rate:  [84-95] 86 (01/17 0456) Resp:  [17-19] 18 (01/17 0456) BP: (118-152)/(62-79) 133/65 (01/17 0456) SpO2:  [91 %-95 %] 91 % (01/17 0456) Last BM Date : 09/28/23  Intake/Output from previous day: 01/16 0701 - 01/17 0700 In: 979.1 [P.O.:820; IV Piggyback:159.1] Out: 1300 [Urine:1300] Intake/Output this shift: No intake/output data recorded.  Physical Exam Constitutional:      General: She is not in acute distress.    Appearance: She is well-developed.  Cardiovascular:     Rate and Rhythm: Normal rate and regular rhythm.  Pulmonary:     Effort: Pulmonary effort is normal.     Breath sounds: Normal breath sounds.  Abdominal:     General: Bowel sounds are normal.     Palpations: There is no fluid wave.     Tenderness: There is abdominal tenderness (right lower abdomen). There is no guarding or rebound.  Neurological:     Mental Status: She is alert.      Lab Results:  Recent Labs    10/02/23 0432 10/03/23 0329  WBC 14.4* 12.1*  HGB 13.6 12.2  HCT 40.8 36.9  PLT 114* 111*   BMET Recent Labs    10/02/23 0432 10/03/23 0329  NA 135 134*  K 2.7* 2.7*  CL 96* 97*  CO2 29 29  GLUCOSE 113* 101*  BUN 10 9  CREATININE 0.92 0.80  CALCIUM 8.4* 8.0*   PT/INR No results for input(s): "LABPROT", "INR" in the last  72 hours.  Studies/Results: No results found.   Anti-infectives: Anti-infectives (From admission, onward)    Start     Dose/Rate Route Frequency Ordered Stop   10/02/23 0000  amoxicillin-clavulanate (AUGMENTIN) 875-125 MG tablet        1 tablet Oral 2 times daily 10/02/23 1351 10/07/23 2359   10/01/23 1700  piperacillin-tazobactam (ZOSYN) IVPB 3.375 g        3.375 g 12.5 mL/hr over 240 Minutes Intravenous Every 8 hours 10/01/23 1449 10/08/23 1659   10/01/23 1000  cefoTEtan (CEFOTAN) 2 g in sodium chloride 0.9 % 100 mL IVPB        2 g 200 mL/hr over 30 Minutes Intravenous On call to O.R. 10/01/23 0958 10/01/23 1315   10/01/23 0630  piperacillin-tazobactam (ZOSYN) IVPB 3.375 g        3.375 g 100 mL/hr over 30 Minutes Intravenous  Once 10/01/23 0617 10/01/23 0704       Assessment/Plan: s/p Procedure(s): XI ROBOTIC LAPAROSCOPIC ASSISTED APPENDECTOMY  Jill Banks is an 68 y.o. female w/ PMHx T2DM, HLD, HTN, OSA, non-alcoholic hepatic steatosis, chronic thrombocytopenia who presented to the ED for lower, mainly right-sided, abdominal pain, decreased appetite, and chills for 3 days who was found to have appendicitis on CT. She is day 2 post op from appendectomy for perforated appendicitis.   Acute Perforated Appendicitis  S/p Appendectomy  Patient feels much better than yesterday. WBC 12.1 down from 15.7 prior to surgery, febrile to 102F at 7 pm, now at 99.5F. She has tenderness to the right lower abdomen. Passing gas helps her pain, but she has not had a bowel movement. Patient is improving and we will switch her to PO antibiotics, also to help with nursing who would need another IV to run Zosyn and Potassium together. - Cease Zosyn, start PO Augmentin - Maintain diet at soft, thin - Replete electrolytes - Keep until tomorrow   Hypokalemia  Hypomagnesemia (resolved) K+ 2.7 this morning, about the same for the past two days. This is a chronic issue for her. Her Mg++ was 1.6  yesterday, but is 2.3 today. 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 Platelets 124k today, which is baseline for at least one year. No concerns for acute drop, no signs of bleeding.  Hypertension -Home amlodipine   LOS: 1 day    Jill Banks 10/03/2023

## 2023-10-03 NOTE — Progress Notes (Signed)
Patient has had uneventful night. Patient has had no pain medication that hasn't been scheduled. Notified MD of patient's potassium of 2.7.  Received order for runs of potassium.

## 2023-10-03 NOTE — Plan of Care (Signed)

## 2023-10-04 LAB — BASIC METABOLIC PANEL
Anion gap: 12 (ref 5–15)
BUN: 8 mg/dL (ref 8–23)
CO2: 28 mmol/L (ref 22–32)
Calcium: 8.5 mg/dL — ABNORMAL LOW (ref 8.9–10.3)
Chloride: 96 mmol/L — ABNORMAL LOW (ref 98–111)
Creatinine, Ser: 0.67 mg/dL (ref 0.44–1.00)
GFR, Estimated: >60 mL/min (ref >60)
Glucose, Bld: 90 mg/dL (ref 70–99)
Potassium: 3.1 mmol/L — ABNORMAL LOW (ref 3.5–5.1)
Sodium: 136 mmol/L (ref 135–145)

## 2023-10-04 LAB — GLUCOSE, CAPILLARY: Glucose-Capillary: 92 mg/dL (ref 70–99)

## 2023-10-04 NOTE — Discharge Summary (Signed)
Physician Discharge Summary  Patient ID: Jill Banks MRN: [id number] DOB/AGE: 24-Sep-1955 68 y.o.  Admit date: 10/01/2023 Discharge date: 10/04/2023  Admission Diagnoses: Acute appendicitis  Discharge Diagnoses:  Principal Problem:   Acute perforated appendicitis Hypokalemia  Discharged Condition: good  Hospital Course: Patient is a 68 year old white female who presented to the emergency room with worsening lower abdominal pain.  CT scan of the abdomen revealed acute appendicitis.  The patient was taken to the operating room on 10/01/2023 and underwent a robotic assisted laparoscopic appendectomy.  She was found to have perforated appendicitis at the time of surgery.  She tolerated the procedure well.  Her postoperative course was remarkable for hypokalemia which has been present for some time as reported by the patient.  Her potassium on discharge was 3.1.  The patient is being discharged home on 10/04/2023 in good and improving condition.   Treatments: Robotic assisted laparoscopic appendectomy on 10/01/2023  Discharge Exam: Blood pressure 133/69, pulse 81, temperature 98.4 F (36.9 C), temperature source Oral, resp. rate 16, height 5\' 7"  (1.702 m), weight 110.2 kg, SpO2 95%. General appearance: alert, cooperative, and no distress Resp: clear to auscultation bilaterally Cardio: regular rate and rhythm, S1, S2 normal, no murmur, click, rub or gallop GI: Soft, incisions healing well.  Disposition: Discharge disposition: 01-Home or Self Care       Discharge Instructions     Diet - low sodium heart healthy   Complete by: As directed    Increase activity slowly   Complete by: As directed       Allergies as of 10/04/2023   No Known Allergies      Medication List     TAKE these medications    Accu-Chek Softclix Lancets lancets Use as instructed to check blood sugars once daily   amLODipine 5 MG tablet Commonly known as: NORVASC Take 1 tablet (5 mg total) by mouth  daily.   amoxicillin-clavulanate 875-125 MG tablet Commonly known as: AUGMENTIN Take 1 tablet by mouth 2 (two) times daily for 5 days.   bisoprolol 10 MG tablet Commonly known as: ZEBETA Take 1 tablet (10 mg total) by mouth daily.   calcitRIOL 0.25 MCG capsule Commonly known as: ROCALTROL TAKE (1) CAPSULE BY MOUTH ONCE DAILY.   chlorthalidone 25 MG tablet Commonly known as: HYGROTON Take 1 tablet (25 mg total) by mouth daily.   cyanocobalamin 1000 MCG tablet Commonly known as: VITAMIN B12 Take 1 tablet (1,000 mcg total) by mouth daily.   diclofenac sodium 1 % Gel Commonly known as: VOLTAREN Apply 2 g topically 4 (four) times daily as needed (knee pain).   docusate sodium 100 MG capsule Commonly known as: COLACE Take 1 capsule (100 mg total) by mouth 2 (two) times daily as needed for mild constipation.   glucose blood test strip Use as instructed to check blood sugars once daily E11.65   ibuprofen 800 MG tablet Commonly known as: ADVIL Take 800 mg by mouth every 8 (eight) hours as needed for mild pain (pain score 1-3).   NON FORMULARY cpap device   ondansetron 4 MG disintegrating tablet Commonly known as: ZOFRAN-ODT Take 1 tablet (4 mg total) by mouth every 6 (six) hours as needed for nausea.   OneTouch Verio Flex System w/Device Kit   oxyCODONE 5 MG immediate release tablet Commonly known as: Oxy IR/ROXICODONE Take 1 tablet (5 mg total) by mouth every 4 (four) hours as needed for severe pain (pain score 7-10) or breakthrough pain.   potassium chloride  SA 20 MEQ tablet Commonly known as: KLOR-CON M TAKE 1 TABLET BY MOUTH TWICE DAILY   Rybelsus 7 MG Tabs Generic drug: Semaglutide Take 1 tablet (7 mg total) by mouth daily.        Follow-up Information     Lucretia Roers, MD Follow up on 10/16/2023.   Specialty: General Surgery Why: one suture at the umbilical region to remove Contact information: [address] [place] [facility]  [address] [phone]                 Signed: Franky Macho 10/04/2023, 8:18 AM

## 2023-10-04 NOTE — Progress Notes (Signed)
Patient discharged home. All questions and concerns answered. Patient has all personal belongings, including discharge paperwork. Patient left floor via wheelchair to POV.

## 2023-10-16 ENCOUNTER — Encounter: Payer: Self-pay | Admitting: General Surgery

## 2023-10-16 ENCOUNTER — Ambulatory Visit: Payer: Medicare HMO | Admitting: General Surgery

## 2023-10-16 VITALS — BP 161/83 | HR 71 | Temp 98.7°F | Resp 14 | Ht 67.0 in | Wt 241.0 lb

## 2023-10-16 DIAGNOSIS — K3532 Acute appendicitis with perforation and localized peritonitis, without abscess: Secondary | ICD-10-CM

## 2023-10-16 NOTE — Patient Instructions (Signed)
Diet and activity as tolerated. Monitor your signs of urination. If worsening pressure, pain, burning with urination let PCP or [place] know about it so someone can get a UA/culture.

## 2023-10-17 NOTE — Progress Notes (Signed)
[facility]  Doing well. Still sore at times in the lower abdomen. Pressure when she urinates but no burning or hesitancy. Some feeling like she cannot empty.   BP (!) 161/83   Pulse 71   Temp 98.7 F (37.1 C) (Oral)   Resp 14   Ht 5\' 7"  (1.702 m)   Wt 241 lb (109.3 kg)   SpO2 92%   BMI 37.75 kg/m  Port sites healing, glue peeling off. Umbilical suture with glue noted, no signs of enlarging hernia at the umbilicus /diastasis area, area was closed at time of surgery, suture on skin removed  Patient s/p robotic assisted appendectomy for perforated appendicitis. Doing well.   Diet and activity as tolerated. Monitor your signs of urination. If worsening pressure, pain, burning with urination let PCP or [place] know about it so someone can get a UA/culture.  Discussed with her that her diastasis/ hernia is no worse and potentially improved after my closure intraoperatively, with her diastasis and thinned skin in the area, I am afraid any major repairs of plication her diastasis would also require skin excision and reconstruction. Her hernia is very small (<1cm with large diastasis around it) and likely was closed with my suturing.   I think low risk of anything incarcerating in the any hernia if it remains due to the skin being so close/ thin in the area and no hernia sac being present. See axial images CT 10/01/23 images 42-64 show the large diastasis and possible small defect near the umbilicus.   Algis Greenhouse, MD [facility] [address] [place], [place] [address] [phone] (office)

## 2023-10-21 ENCOUNTER — Ambulatory Visit: Payer: Medicare HMO | Admitting: Internal Medicine

## 2023-10-27 ENCOUNTER — Encounter: Payer: Self-pay | Admitting: Nurse Practitioner

## 2023-10-27 ENCOUNTER — Ambulatory Visit (INDEPENDENT_AMBULATORY_CARE_PROVIDER_SITE_OTHER): Payer: Medicare HMO | Admitting: Nurse Practitioner

## 2023-10-27 VITALS — BP 150/88 | HR 60 | Temp 98.2°F | Resp 16 | Ht 67.0 in | Wt 241.6 lb

## 2023-10-27 DIAGNOSIS — Z9049 Acquired absence of other specified parts of digestive tract: Secondary | ICD-10-CM

## 2023-10-27 DIAGNOSIS — E1169 Type 2 diabetes mellitus with other specified complication: Secondary | ICD-10-CM

## 2023-10-27 DIAGNOSIS — Z09 Encounter for follow-up examination after completed treatment for conditions other than malignant neoplasm: Secondary | ICD-10-CM | POA: Diagnosis not present

## 2023-10-27 DIAGNOSIS — K3532 Acute appendicitis with perforation and localized peritonitis, without abscess: Secondary | ICD-10-CM | POA: Diagnosis not present

## 2023-10-27 NOTE — Progress Notes (Signed)
[facility] [place], [place] [address] [place] [address] [phone]                                   Transitional Care Clinic   [facility] Discharge Acute Issues Care Follow Up                                                                        Patient Demographics  Jill Banks, is a 68 y.o. female  DOB [birth year]  MRN [id number].  Primary MD  Sallyanne Kuster, NP  Admit date: 10/01/2023 Discharge date: 10/04/2023  Reason for TCC follow Up - acute appendicitis with perforation   Past Medical History:  Diagnosis Date   Anxiety    Arthritis    Blood dyscrasia    Thrombocytopenia   Bruises easily    COVID-19 04/2019   Diabetes mellitus without complication (HCC)    Headache    HLD (hyperlipidemia)    Hypertension    Leg pain    Pneumonia    about 5 yrs ago   Sleep apnea    no CPAP    Past Surgical History:  Procedure Laterality Date   APPLICATION OF WOUND VAC 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   Procedure: APPLICATION OF WOUND VAC;  Surgeon: Kennedy Bucker, MD;  Location: [facility] ORS;  Service: Orthopedics;  Laterality: Left;  #[id number]   COLONOSCOPY WITH PROPOFOL N/A 09/25/2021   Procedure: COLONOSCOPY WITH PROPOFOL;  Surgeon: Toney Reil, MD;  Location: [facility] ENDOSCOPY;  Service: Gastroenterology;  Laterality: N/A;   DILATATION & CURETTAGE/HYSTEROSCOPY WITH MYOSURE  10/17/2020   Procedure: DILATATION & CURETTAGE/HYSTEROSCOPY WITH MYOSURE POLYPECTOMY;  Surgeon: Natale Milch, MD;  Location: [facility] ORS;  Service: Gynecology;;   DILATION AND CURETTAGE OF UTERUS     x 3   TOE SURGERY Left    second toe   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   Procedure: LEFT TOTAL KNEE ARTHROPLASTY;  Surgeon: Kennedy Bucker, MD;  Location: [facility] ORS;  Service: Orthopedics;  Laterality: Left;   TOTAL KNEE ARTHROPLASTY Right 02/14/2023   Procedure: RIGHT TOTAL KNEE ARTHROPLASTY;  Surgeon: Kathryne Hitch, MD;  Location: [facility] ORS;  Service:  Orthopedics;  Laterality: Right;   XI ROBOTIC LAPAROSCOPIC ASSISTED APPENDECTOMY N/A 10/01/2023   Procedure: XI ROBOTIC LAPAROSCOPIC ASSISTED APPENDECTOMY;  Surgeon: Lucretia Roers, MD;  Location: [facility] ORS;  Service: General;  Laterality: N/A;       Recent HPI and Hospital Course  Hospital Course: Patient is a 68 year old white female who presented to the emergency room with worsening lower abdominal pain.  CT scan of the abdomen revealed acute appendicitis.  The patient was taken to the operating room on 10/01/2023 and underwent a robotic assisted laparoscopic appendectomy.  She was found to have perforated appendicitis at the time of surgery.  She tolerated the procedure well.  Her postoperative course was remarkable for hypokalemia which has been present for some time as reported by the patient.  Her potassium on discharge was 3.1.  The patient is being discharged home on 10/04/2023 in good and improving condition.      Post Hospital Acute Care Issue to be followed  in the Clinic     Acute perforated appendicitis Hypokalemia   Subjective:   [name] today has, No headache, No chest pain, No abdominal pain - No Nausea, No new weakness tingling or numbness, No Cough - SOB.   Assessment & Plan   1. Hospital discharge follow-up (Primary) Appendicitis, s/p appendectomy and IV antibiotics.   2. Acute perforated appendicitis Treated in the hospital with appendectomy, IV antibiotics   3. S/P laparoscopic appendectomy Doing well, incisions are approximated and healing without signs of infection.   4. Type 2 diabetes mellitus with other specified complication, without long-term current use of insulin (HCC) Stopped Rybelsus, not currently taking any medications for diabetes. Patient instructed to check her glucose at least once daily. May work on controlling glucose levels with diet but if glucose levels are elevated, may need to start medication.     Reason for frequent  admissions/ER visits    type 2 diabetes Hypokalemia hypertension   Objective:   Vitals:   10/27/23 0856  BP: (!) 150/88  Pulse: 60  Resp: 16  Temp: 98.2 F (36.8 C)  SpO2: 93%  Weight: 241 lb 9.6 oz (109.6 kg)  Height: 5\' 7"  (1.702 m)    Wt Readings from Last 3 Encounters:  10/27/23 241 lb 9.6 oz (109.6 kg)  10/16/23 241 lb (109.3 kg)  10/01/23 242 lb 15.2 oz (110.2 kg)    Allergies as of 10/27/2023   No Known Allergies      Medication List        Accurate as of October 27, 2023  9:31 AM. If you have any questions, ask your nurse or doctor.          STOP taking these medications    Rybelsus 7 MG Tabs Generic drug: Semaglutide Stopped by: Sallyanne Kuster       TAKE these medications    Accu-Chek Softclix Lancets lancets Use as instructed to check blood sugars once daily   amLODipine 5 MG tablet Commonly known as: NORVASC Take 1 tablet (5 mg total) by mouth daily.   bisoprolol 10 MG tablet Commonly known as: ZEBETA Take 1 tablet (10 mg total) by mouth daily.   calcitRIOL 0.25 MCG capsule Commonly known as: ROCALTROL TAKE (1) CAPSULE BY MOUTH ONCE DAILY.   chlorthalidone 25 MG tablet Commonly known as: HYGROTON Take 1 tablet (25 mg total) by mouth daily.   cyanocobalamin 1000 MCG tablet Commonly known as: VITAMIN B12 Take 1 tablet (1,000 mcg total) by mouth daily.   diclofenac sodium 1 % Gel Commonly known as: VOLTAREN Apply 2 g topically 4 (four) times daily as needed (knee pain).   docusate sodium 100 MG capsule Commonly known as: COLACE Take 1 capsule (100 mg total) by mouth 2 (two) times daily as needed for mild constipation.   glucose blood test strip Use as instructed to check blood sugars once daily E11.65   ibuprofen 800 MG tablet Commonly known as: ADVIL Take 800 mg by mouth every 8 (eight) hours as needed for mild pain (pain score 1-3).   NON FORMULARY cpap device   OneTouch Verio Flex System w/Device Kit    potassium chloride SA 20 MEQ tablet Commonly known as: KLOR-CON M TAKE 1 TABLET BY MOUTH TWICE DAILY         Physical Exam: Constitutional: Patient appears well-developed and well-nourished. Not in obvious distress. HENT: Normocephalic, atraumatic, External right and left ear normal. Oropharynx is clear and moist.  Eyes: Conjunctivae and EOM are normal. PERRLA,  no scleral icterus. Neck: Normal ROM. Neck supple. No JVD. No tracheal deviation. No thyromegaly. CVS: RRR, S1/S2 +, no murmurs, no gallops, no carotid bruit.  Pulmonary: Effort and breath sounds normal, no stridor, rhonchi, wheezes, rales.  Abdominal: Soft. BS +, no distension, tenderness, rebound or guarding.  Musculoskeletal: Normal range of motion. No edema and no tenderness.  Lymphadenopathy: No lymphadenopathy noted, cervical, inguinal or axillary Neuro: Alert. Normal reflexes, muscle tone coordination. No cranial nerve deficit. Skin: Skin is warm and dry. No rash noted. Not diaphoretic. No erythema. No pallor. Psychiatric: Normal mood and affect. Behavior, judgment, thought content normal.   Data Review   Micro Results No results found for this or any previous visit (from the past 240 hours).   CBC No results for input(s): "WBC", "HGB", "HCT", "PLT", "MCV", "MCH", "MCHC", "RDW", "LYMPHSABS", "MONOABS", "EOSABS", "BASOSABS", "BANDABS" in the last 168 hours.  Invalid input(s): "NEUTRABS", "BANDSABD"  Chemistries  No results for input(s): "NA", "K", "CL", "CO2", "GLUCOSE", "BUN", "CREATININE", "CALCIUM", "MG", "AST", "ALT", "ALKPHOS", "BILITOT" in the last 168 hours.  Invalid input(s): "GFRCGP" ------------------------------------------------------------------------------------------------------------------ CrCl cannot be calculated (Patient's most recent lab result is older than the maximum 21 days  allowed.). ------------------------------------------------------------------------------------------------------------------ No results for input(s): "HGBA1C" in the last 72 hours. ------------------------------------------------------------------------------------------------------------------ No results for input(s): "CHOL", "HDL", "LDLCALC", "TRIG", "CHOLHDL", "LDLDIRECT" in the last 72 hours. ------------------------------------------------------------------------------------------------------------------ No results for input(s): "TSH", "T4TOTAL", "T3FREE", "THYROIDAB" in the last 72 hours.  Invalid input(s): "FREET3" ------------------------------------------------------------------------------------------------------------------ No results for input(s): "VITAMINB12", "FOLATE", "FERRITIN", "TIBC", "IRON", "RETICCTPCT" in the last 72 hours.  Coagulation profile No results for input(s): "INR", "PROTIME" in the last 168 hours.  No results for input(s): "DDIMER" in the last 72 hours.  Cardiac Enzymes No results for input(s): "CKMB", "TROPONINI", "MYOGLOBIN" in the last 168 hours.  Invalid input(s): "CK" ------------------------------------------------------------------------------------------------------------------ Invalid input(s): "POCBNP"  Return for previously scheduled, F/U, Kym Scannell PCP on 12/23/2023.   Time Spent in minutes  45 Time spent with patient included reviewing progress notes, labs, imaging studies, and discussing plan for follow up.   This patient was seen by Sallyanne Kuster, FNP-C in collaboration with Dr. Beverely Risen as a part of collaborative care agreement.    Sallyanne Kuster MSN, FNP-C on 10/27/2023 at 9:31 AM   **Disclaimer: This note may have been dictated with voice recognition software. Similar sounding words can inadvertently be transcribed and this note may contain transcription errors which may not have been corrected upon publication of note.**

## 2023-11-03 ENCOUNTER — Other Ambulatory Visit: Payer: Self-pay | Admitting: Nurse Practitioner

## 2023-11-20 ENCOUNTER — Inpatient Hospital Stay: Payer: Medicare HMO

## 2023-11-20 ENCOUNTER — Inpatient Hospital Stay: Payer: Medicare HMO | Attending: Oncology | Admitting: Oncology

## 2023-11-20 ENCOUNTER — Encounter: Payer: Self-pay | Admitting: Oncology

## 2023-11-20 VITALS — BP 145/75 | HR 58 | Resp 20 | Wt 243.5 lb

## 2023-11-20 DIAGNOSIS — D696 Thrombocytopenia, unspecified: Secondary | ICD-10-CM | POA: Insufficient documentation

## 2023-11-20 DIAGNOSIS — Z87891 Personal history of nicotine dependence: Secondary | ICD-10-CM | POA: Insufficient documentation

## 2023-11-20 DIAGNOSIS — Z803 Family history of malignant neoplasm of breast: Secondary | ICD-10-CM | POA: Diagnosis not present

## 2023-11-20 LAB — CBC WITH DIFFERENTIAL (CANCER CENTER ONLY)
Abs Immature Granulocytes: 0.03 10*3/uL (ref 0.00–0.07)
Basophils Absolute: 0 10*3/uL (ref 0.0–0.1)
Basophils Relative: 1 %
Eosinophils Absolute: 0.3 10*3/uL (ref 0.0–0.5)
Eosinophils Relative: 4 %
HCT: 40.5 % (ref 36.0–46.0)
Hemoglobin: 13.3 g/dL (ref 12.0–15.0)
Immature Granulocytes: 0 %
Lymphocytes Relative: 36 %
Lymphs Abs: 2.7 10*3/uL (ref 0.7–4.0)
MCH: 27.8 pg (ref 26.0–34.0)
MCHC: 32.8 g/dL (ref 30.0–36.0)
MCV: 84.6 fL (ref 80.0–100.0)
Monocytes Absolute: 0.8 10*3/uL (ref 0.1–1.0)
Monocytes Relative: 10 %
Neutro Abs: 3.6 10*3/uL (ref 1.7–7.7)
Neutrophils Relative %: 49 %
Platelet Count: 144 10*3/uL — ABNORMAL LOW (ref 150–400)
RBC: 4.79 MIL/uL (ref 3.87–5.11)
RDW: 15.3 % (ref 11.5–15.5)
Smear Review: ADEQUATE
WBC Count: 7.4 10*3/uL (ref 4.0–10.5)
nRBC: 0 % (ref 0.0–0.2)

## 2023-11-20 LAB — IMMATURE PLATELET FRACTION: Immature Platelet Fraction: 17.9 % — ABNORMAL HIGH (ref 1.2–8.6)

## 2023-11-20 NOTE — Assessment & Plan Note (Addendum)
 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  Slightly increased light chain ratio, nonspecific. Negative 24 hour UPEP Labs are reviewed and discussed with patient. Platelet count has improved to 144,000 Recommend observation.  empiric vitamin B12 1000 mcg daily.

## 2023-11-20 NOTE — Progress Notes (Signed)
 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  Slightly increased light chain ratio, nonspecific. Negative 24 hour UPEP Labs are reviewed and discussed with patient. Platelet count has improved to 144,000 Recommend observation.  empiric vitamin B12 1000 mcg daily.     Orders Placed This Encounter  Procedures   CBC with Differential (Cancer Center Only)    Standing Status:   Future    Expected Date:   11/19/2024    Expiration Date:   11/19/2024   CMP (Cancer Center only)    Standing Status:   Future    Expected Date:   11/19/2024    Expiration Date:   11/19/2024   Vitamin B12    Standing Status:   Future    Expected Date:   11/19/2024    Expiration Date:   11/19/2024   Follow up 6 months All questions were answered. The patient knows to call the clinic with any problems, questions or concerns.  Rickard Patience, MD, PhD [facility] Hematology Oncology 11/20/2023    HISTORY OF PRESENTING ILLNESS:  Jill Banks is a 68 y.o. female who was seen in consultation at the request of Sallyanne Kuster, NP for evaluation of  Thrombocytopenia  Reviewed patient's previous labs. Thrombocytopenia is chronic chronic onset , since at least 2020. Denies weight loss, fever, chills, fatigue, night sweats.  Denies hematochezia, hematuria, hematemesis, epistaxis, black tarry stool.  Patient has no easy bruising.    Denies history hepatitis or HIV infection Denies history of chronic liver disease Denies routine alcohol consumption. Denies dietary restrictions.    INTERVAL HISTORY Jill Banks is a 68 y.o. female who has above history  reviewed by me today presents for follow up visit for thrombocytopenia patient presents to discuss results.  She has no new complaints. 10/01/23-10/04/2023 hospitalization due to acute perforated appendicitis s/p robotic assisted laparoscopic appendectomy.  Today she reports feeling well.   MEDICAL HISTORY:  Past Medical History:  Diagnosis Date   Anxiety    Arthritis    Blood dyscrasia    Thrombocytopenia   Bruises easily    COVID-19 04/2019   Diabetes mellitus without complication (HCC)    Headache    HLD (hyperlipidemia)    Hypertension    Leg pain    Pneumonia    about 5 yrs ago   Sleep apnea    no CPAP    SURGICAL HISTORY: Past Surgical History:  Procedure Laterality Date   APPLICATION OF WOUND VAC 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   Procedure: APPLICATION OF WOUND VAC;  Surgeon: Kennedy Bucker, MD;  Location: [facility] ORS;  Service: Orthopedics;  Laterality: Left;  #[id number]   COLONOSCOPY WITH PROPOFOL N/A 09/25/2021   Procedure: COLONOSCOPY WITH PROPOFOL;  Surgeon: Toney Reil, MD;  Location: [facility] ENDOSCOPY;  Service: Gastroenterology;  Laterality: N/A;   DILATATION & CURETTAGE/HYSTEROSCOPY WITH MYOSURE  10/17/2020   Procedure: DILATATION & CURETTAGE/HYSTEROSCOPY WITH MYOSURE POLYPECTOMY;  Surgeon: Natale Milch, MD;  Location: [facility] ORS;  Service: Gynecology;;   DILATION AND CURETTAGE OF UTERUS     x 3   TOE SURGERY Left    second toe   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   Procedure: LEFT TOTAL KNEE ARTHROPLASTY;  Surgeon: Kennedy Bucker, MD;  Location:  [facility] ORS;  Service: Orthopedics;  Laterality: Left;   TOTAL KNEE ARTHROPLASTY Right 02/14/2023   Procedure: RIGHT TOTAL KNEE ARTHROPLASTY;  Surgeon: Kathryne Hitch, MD;  Location: [facility] ORS;  Service: Orthopedics;  Laterality: Right;   XI ROBOTIC LAPAROSCOPIC ASSISTED APPENDECTOMY N/A 10/01/2023   Procedure: XI ROBOTIC LAPAROSCOPIC ASSISTED APPENDECTOMY;  Surgeon: Lucretia Roers, MD;  Location: [facility] ORS;  Service:  General;  Laterality: N/A;    SOCIAL HISTORY: Social History   Socioeconomic History   Marital status: Married    Spouse name: Not on file   Number of children: Not on file   Years of education: Not on file   Highest education level: Not on file  Occupational History   Not on file  Tobacco Use   Smoking status: Former    Current packs/day: 0.00    Average packs/day: 0.3 packs/day for 2.0 years (0.5 ttl pk-yrs)    Types: Cigarettes    Start date: 2007    Quit date: 2009    Years since quitting: 16.1   Smokeless tobacco: Never  Vaping Use   Vaping status: Never Used  Substance and Sexual Activity   Alcohol use: Yes    Comment: ocassionally   Drug use: No   Sexual activity: Yes  Other Topics Concern   Not on file  Social History Narrative   Not on file   Social Drivers of Health   Financial Resource Strain: Not on file  Food Insecurity: No Food Insecurity (10/02/2023)   Hunger Vital Sign    Worried About Running Out of Food in the Last Year: Never true    Ran Out of Food in the Last Year: Never true  Transportation Needs: No Transportation Needs (10/02/2023)   PRAPARE - Administrator, Civil Service (Medical): No    Lack of Transportation (Non-Medical): No  Physical Activity: Not on file  Stress: Not on file  Social Connections: Unknown (10/02/2023)   Social Connection and Isolation Panel [NHANES]    Frequency of Communication with Friends and Family: More than three times a week    Frequency of Social Gatherings with Friends and Family: More than three times a week    Attends Religious Services: Not on file    Active Member of Clubs or Organizations: Yes    Attends Banker Meetings: More than 4 times per year    Marital Status: Married  Catering manager Violence: Not At Risk (10/02/2023)   Humiliation, Afraid, Rape, and Kick questionnaire    Fear of Current or Ex-Partner: No    Emotionally Abused: No    Physically Abused: No    Sexually  Abused: No    FAMILY HISTORY: Family History  Problem Relation Age of Onset   Hypertension Father    Stroke Father    Hypertension Brother    Stroke Brother    Hyperlipidemia Brother    Breast cancer Maternal Aunt    Asthma Son     ALLERGIES:  has no known allergies.  MEDICATIONS:  Current Outpatient Medications  Medication Sig Dispense Refill   Accu-Chek Softclix Lancets lancets Use as instructed to check blood sugars once daily 300 each 3   amLODipine (NORVASC) 5 MG tablet Take 1 tablet (5 mg total) by mouth daily. 90 tablet 3   bisoprolol (ZEBETA) 10 MG tablet Take 1 tablet (10 mg total) by mouth daily. 90 tablet 3   Blood Glucose Monitoring Suppl (ONETOUCH VERIO FLEX SYSTEM) w/Device KIT  calcitRIOL (ROCALTROL) 0.25 MCG capsule TAKE (1) CAPSULE BY MOUTH ONCE DAILY. 90 capsule 5   chlorthalidone (HYGROTON) 25 MG tablet Take 1 tablet (25 mg total) by mouth daily. 90 tablet 1   cyanocobalamin (VITAMIN B12) 1000 MCG tablet TAKE ONE TABLET BY MOUTH EVERY MORNING 90 tablet 1   diclofenac sodium (VOLTAREN) 1 % GEL Apply 2 g topically 4 (four) times daily as needed (knee pain).     docusate sodium (COLACE) 100 MG capsule Take 1 capsule (100 mg total) by mouth 2 (two) times daily as needed for mild constipation. 30 capsule 0   glucose blood test strip Use as instructed to check blood sugars once daily E11.65 300 each 3   ibuprofen (ADVIL) 800 MG tablet Take 800 mg by mouth every 8 (eight) hours as needed for mild pain (pain score 1-3).     potassium chloride SA (KLOR-CON M) 20 MEQ tablet TAKE 1 TABLET BY MOUTH TWICE DAILY 180 tablet 11   NON FORMULARY cpap device (Patient not taking: Reported on 11/20/2023)     No current facility-administered medications for this visit.    Review of Systems  Constitutional:  Negative for appetite change, chills, fatigue and fever.  HENT:   Negative for hearing loss and voice change.   Eyes:  Negative for eye problems.  Respiratory:  Negative  for chest tightness and cough.   Cardiovascular:  Negative for chest pain.  Gastrointestinal:  Negative for abdominal distention, abdominal pain and blood in stool.  Endocrine: Negative for hot flashes.  Genitourinary:  Negative for difficulty urinating and frequency.   Musculoskeletal:  Negative for arthralgias.  Skin:  Negative for itching and rash.  Neurological:  Negative for extremity weakness.  Hematological:  Negative for adenopathy.  Psychiatric/Behavioral:  Negative for confusion.     PHYSICAL EXAMINATION:  Vitals:   11/20/23 1348  BP: (!) 145/75  Pulse: (!) 58  Resp: 20  SpO2: 100%   Filed Weights   11/20/23 1348  Weight: 243 lb 8 oz (110.5 kg)    Physical Exam Constitutional:      General: She is not in acute distress. HENT:     Head: Normocephalic and atraumatic.  Eyes:     General: No scleral icterus. Cardiovascular:     Rate and Rhythm: Normal rate.  Pulmonary:     Effort: Pulmonary effort is normal. No respiratory distress.  Abdominal:     General: There is no distension.  Musculoskeletal:        General: Normal range of motion.     Cervical back: Normal range of motion and neck supple.  Skin:    Findings: No rash.  Neurological:     Mental Status: She is alert and oriented to person, place, and time. Mental status is at baseline.  Psychiatric:        Mood and Affect: Mood normal.      LABORATORY DATA:  I have reviewed the data as listed    Latest Ref Rng & Units 11/20/2023    1:15 PM 10/03/2023    3:29 AM 10/02/2023    4:32 AM  CBC  WBC 4.0 - 10.5 K/uL 7.4  12.1  14.4   Hemoglobin 12.0 - 15.0 g/dL 86.5  78.4  69.6   Hematocrit 36.0 - 46.0 % 40.5  36.9  40.8   Platelets 150 - 400 K/uL 144  111  114       Latest Ref Rng & Units 10/04/2023    4:09 AM 10/03/2023  3:29 AM 10/02/2023    4:32 AM  CMP  Glucose 70 - 99 mg/dL 90  161  096   BUN 8 - 23 mg/dL 8  9  10    Creatinine 0.44 - 1.00 mg/dL 0.45  4.09  8.11   Sodium 135 - 145 mmol/L 136   134  135   Potassium 3.5 - 5.1 mmol/L 3.1  2.7  2.7   Chloride 98 - 111 mmol/L 96  97  96   CO2 22 - 32 mmol/L 28  29  29    Calcium 8.9 - 10.3 mg/dL 8.5  8.0  8.4      RADIOGRAPHIC STUDIES: I have personally reviewed the radiological images as listed and agreed with the findings in the report.  CT ABDOMEN PELVIS W CONTRAST Result Date: 10/01/2023 CLINICAL DATA:  68 year old female with history of right lower quadrant abdominal pain. Diarrhea. EXAM: CT ABDOMEN AND PELVIS WITH CONTRAST TECHNIQUE: Multidetector CT imaging of the abdomen and pelvis was performed using the standard protocol following bolus administration of intravenous contrast. RADIATION DOSE REDUCTION: This exam was performed according to the departmental dose-optimization program which includes automated exposure control, adjustment of the mA and/or kV according to patient size and/or use of iterative reconstruction technique. CONTRAST:  OMNIPAQUE IOHEXOL 300 MG/ML  SOLN COMPARISON:  CT of the abdomen and pelvis 07/18/2020. FINDINGS: Lower chest: Unremarkable. Hepatobiliary: Multiple low-attenuation lesions are scattered throughout the liver, largest of which are compatible with simple cysts, measuring up to 1.9 x 1.3 cm in the periphery of the right lobe of the liver (axial image 20 of series 2). Other smaller lesions are too small to definitively characterize, but appear similar to prior studies and statistically likely to represent tiny cysts and/or biliary hamartomas (no imaging follow-up for any of these lesions is recommended). No aggressive appearing hepatic lesions. No intra or extrahepatic biliary ductal dilatation. Gallbladder is unremarkable in appearance. Pancreas: No pancreatic mass. No pancreatic ductal dilatation. No pancreatic or peripancreatic fluid collections or inflammatory changes. Spleen: Unremarkable. Adrenals/Urinary Tract: Nonobstructive calculi measuring 2-3 mm in the right renal collecting system. Bilateral  kidneys and adrenal glands are otherwise normal in appearance. No hydroureteronephrosis. Urinary bladder is nearly decompressed, but otherwise unremarkable in appearance. Stomach/Bowel: The appearance of the stomach is normal. Numerous colonic diverticuli are noted, without definite focal surrounding inflammatory changes to indicate an acute diverticulitis at this time. The appendix is dilated and inflamed, indicative of acute appendicitis. Appendix: Location: Inferior to the cecum Diameter: Up to 15 mm Appendicolith: None Mucosal hyper-enhancement: Present Extraluminal gas: None Periappendiceal collection: Extensive periappendiceal soft tissue stranding and trace volume of fluid. No well organized periappendiceal fluid collection to suggest abscess at this time. Vascular/Lymphatic: Atherosclerosis in the abdominal aorta and pelvic vasculature. No lymphadenopathy noted in the abdomen or pelvis. Reproductive: Uterus is enlarged and heterogeneous in appearance with multiple heterogeneously enhancing lesions compatible with multifocal fibroids, largest of which measures approximately 6.8 x 4.9 cm in the right side of the uterine body. Ovaries are unremarkable in appearance. Other: No significant volume of ascites.  No pneumoperitoneum. Musculoskeletal: There are no aggressive appearing lytic or blastic lesions noted in the visualized portions of the skeleton. IMPRESSION: 1. Acute appendicitis with extensive periappendiceal inflammatory changes but no definite periappendiceal abscess confidently identified at this time. Surgical consultation is strongly recommended. 2. Extensive colonic diverticulosis without definite evidence of acute diverticulitis at this time. 3. 2-3 mm nonobstructive calculi in the right renal collecting system. 4. Aortic atherosclerosis. 5. Additional  incidental findings, as above. Electronically Signed   By: Trudie Reed M.D.   On: 10/01/2023 06:07

## 2023-12-23 ENCOUNTER — Telehealth: Payer: Self-pay | Admitting: Nurse Practitioner

## 2023-12-23 ENCOUNTER — Ambulatory Visit: Payer: Medicare HMO | Admitting: Nurse Practitioner

## 2023-12-23 ENCOUNTER — Encounter: Payer: Self-pay | Admitting: Nurse Practitioner

## 2023-12-23 VITALS — BP 130/75 | HR 81 | Temp 98.4°F | Resp 16 | Ht 67.0 in | Wt 241.6 lb

## 2023-12-23 DIAGNOSIS — I152 Hypertension secondary to endocrine disorders: Secondary | ICD-10-CM

## 2023-12-23 DIAGNOSIS — E1169 Type 2 diabetes mellitus with other specified complication: Secondary | ICD-10-CM

## 2023-12-23 DIAGNOSIS — E1159 Type 2 diabetes mellitus with other circulatory complications: Secondary | ICD-10-CM | POA: Diagnosis not present

## 2023-12-23 DIAGNOSIS — L918 Other hypertrophic disorders of the skin: Secondary | ICD-10-CM

## 2023-12-23 DIAGNOSIS — E876 Hypokalemia: Secondary | ICD-10-CM | POA: Diagnosis not present

## 2023-12-23 DIAGNOSIS — N950 Postmenopausal bleeding: Secondary | ICD-10-CM

## 2023-12-23 DIAGNOSIS — D229 Melanocytic nevi, unspecified: Secondary | ICD-10-CM

## 2023-12-23 LAB — POCT GLYCOSYLATED HEMOGLOBIN (HGB A1C): Hemoglobin A1C: 5.9 % — AB (ref 4.0–5.6)

## 2023-12-23 MED ORDER — CHLORTHALIDONE 25 MG PO TABS
25.0000 mg | ORAL_TABLET | Freq: Every day | ORAL | 1 refills | Status: DC
Start: 2023-12-23 — End: 2024-03-26

## 2023-12-23 MED ORDER — POTASSIUM CHLORIDE CRYS ER 20 MEQ PO TBCR: 20.0000 meq | EXTENDED_RELEASE_TABLET | Freq: Two times a day (BID) | ORAL | 3 refills | Status: DC

## 2023-12-23 NOTE — Telephone Encounter (Signed)
 Dermatology referral sent via Epic to Dr. Langston Reusing w/ [facility] per patient request. Notified patient. Gave pt telephone # [phone]

## 2023-12-23 NOTE — Progress Notes (Signed)
 [facility] [address] [place], [place] [address]  Internal MEDICINE  Office Visit Note  Patient Name: Jill Banks  [id number]  [id number]  Date of Service: 12/23/2023  Chief Complaint  Patient presents with   Diabetes   Hyperlipidemia   Hypertension   Follow-up    HPI [name] presents for a follow-up visit for diabetes, hypertension and vaginal bleeding.  Diabetes -- A1c improved further to 5.9, stable Hypertension -- elevated BP, rechecked and her BP improve.  Postmenopausal bleeding -- started on 12/06/23 and last about 3 days, was a moderate flow. No cramping or any other symptoms with it. Declined pap smear for now, wants to see if the period comes back in a few weeks.  Multiple moles and skin tags, needs a dermatology referral.     Current Medication: Outpatient Encounter Medications as of 12/23/2023  Medication Sig   Accu-Chek Softclix Lancets lancets Use as instructed to check blood sugars once daily   amLODipine (NORVASC) 5 MG tablet Take 1 tablet (5 mg total) by mouth daily.   bisoprolol (ZEBETA) 10 MG tablet Take 1 tablet (10 mg total) by mouth daily.   Blood Glucose Monitoring Suppl (ONETOUCH VERIO FLEX SYSTEM) w/Device KIT    calcitRIOL (ROCALTROL) 0.25 MCG capsule TAKE (1) CAPSULE BY MOUTH ONCE DAILY.   cyanocobalamin (VITAMIN B12) 1000 MCG tablet TAKE ONE TABLET BY MOUTH EVERY MORNING   diclofenac sodium (VOLTAREN) 1 % GEL Apply 2 g topically 4 (four) times daily as needed (knee pain).   docusate sodium (COLACE) 100 MG capsule Take 1 capsule (100 mg total) by mouth 2 (two) times daily as needed for mild constipation.   glucose blood test strip Use as instructed to check blood sugars once daily E11.65   ibuprofen (ADVIL) 800 MG tablet Take 800 mg by mouth every 8 (eight) hours as needed for mild pain (pain score 1-3).   NON FORMULARY cpap device   [DISCONTINUED] chlorthalidone (HYGROTON) 25 MG tablet Take 1 tablet (25 mg total) by mouth daily.    [DISCONTINUED] potassium chloride SA (KLOR-CON M) 20 MEQ tablet TAKE 1 TABLET BY MOUTH TWICE DAILY   chlorthalidone (HYGROTON) 25 MG tablet Take 1 tablet (25 mg total) by mouth daily.   potassium chloride SA (KLOR-CON M) 20 MEQ tablet Take 1 tablet (20 mEq total) by mouth 2 (two) times daily.   No facility-administered encounter medications on file as of 12/23/2023.    Surgical History: Past Surgical History:  Procedure Laterality Date   APPLICATION OF WOUND VAC 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   Procedure: APPLICATION OF WOUND VAC;  Surgeon: Molli Angelucci, MD;  Location: [facility] ORS;  Service: Orthopedics;  Laterality: Left;  #[id number]   COLONOSCOPY WITH PROPOFOL N/A 09/25/2021   Procedure: COLONOSCOPY WITH PROPOFOL;  Surgeon: Selena Daily, MD;  Location: [facility] ENDOSCOPY;  Service: Gastroenterology;  Laterality: N/A;   DILATATION & CURETTAGE/HYSTEROSCOPY WITH MYOSURE  10/17/2020   Procedure: DILATATION & CURETTAGE/HYSTEROSCOPY WITH MYOSURE POLYPECTOMY;  Surgeon: Heron Lord, MD;  Location: [facility] ORS;  Service: Gynecology;;   DILATION AND CURETTAGE OF UTERUS     x 3   TOE SURGERY Left    second toe   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   Procedure: LEFT TOTAL KNEE ARTHROPLASTY;  Surgeon: Molli Angelucci, MD;  Location: [facility] ORS;  Service: Orthopedics;  Laterality: Left;   TOTAL KNEE ARTHROPLASTY Right 02/14/2023   Procedure: RIGHT TOTAL KNEE ARTHROPLASTY;  Surgeon: Arnie Lao, MD;  Location: [facility] ORS;  Service: Orthopedics;  Laterality: Right;   XI ROBOTIC LAPAROSCOPIC ASSISTED APPENDECTOMY N/A 10/01/2023   Procedure: XI ROBOTIC LAPAROSCOPIC ASSISTED APPENDECTOMY;  Surgeon: Awilda Bogus, MD;  Location: [facility] ORS;  Service: General;  Laterality: N/A;    Medical History: Past Medical History:  Diagnosis Date   Anxiety    Arthritis    Blood dyscrasia    Thrombocytopenia   Bruises easily    COVID-19 04/2019   Diabetes mellitus without complication (HCC)    Headache    HLD  (hyperlipidemia)    Hypertension    Leg pain    Pneumonia    about 5 yrs ago   Sleep apnea    no CPAP    Family History: Family History  Problem Relation Age of Onset   Hypertension Father    Stroke Father    Hypertension Brother    Stroke Brother    Hyperlipidemia Brother    Breast cancer Maternal Aunt    Asthma Son     Social History   Socioeconomic History   Marital status: Married    Spouse name: Not on file   Number of children: Not on file   Years of education: Not on file   Highest education level: Not on file  Occupational History   Not on file  Tobacco Use   Smoking status: Former    Current packs/day: 0.00    Average packs/day: 0.3 packs/day for 2.0 years (0.5 ttl pk-yrs)    Types: Cigarettes    Start date: 2007    Quit date: 2009    Years since quitting: 16.2   Smokeless tobacco: Never  Vaping Use   Vaping status: Never Used  Substance and Sexual Activity   Alcohol use: Yes    Comment: ocassionally   Drug use: No   Sexual activity: Yes  Other Topics Concern   Not on file  Social History Narrative   Not on file   Social Drivers of Health   Financial Resource Strain: Not on file  Food Insecurity: No Food Insecurity (10/02/2023)   Hunger Vital Sign    Worried About Running Out of Food in the Last Year: Never true    Ran Out of Food in the Last Year: Never true  Transportation Needs: No Transportation Needs (10/02/2023)   PRAPARE - Administrator, Civil Service (Medical): No    Lack of Transportation (Non-Medical): No  Physical Activity: Not on file  Stress: Not on file  Social Connections: Unknown (10/02/2023)   Social Connection and Isolation Panel [NHANES]    Frequency of Communication with Friends and Family: More than three times a week    Frequency of Social Gatherings with Friends and Family: More than three times a week    Attends Religious Services: Not on file    Active Member of Clubs or Organizations: Yes    Attends  Banker Meetings: More than 4 times per year    Marital Status: Married  Catering manager Violence: Not At Risk (10/02/2023)   Humiliation, Afraid, Rape, and Kick questionnaire    Fear of Current or Ex-Partner: No    Emotionally Abused: No    Physically Abused: No    Sexually Abused: No      Review of Systems  Constitutional:  Negative for chills, fatigue and unexpected weight change.  HENT:  Negative for congestion, rhinorrhea, sneezing and sore throat.   Eyes:  Negative for redness.  Respiratory: Negative.  Negative for cough, chest tightness, shortness of breath  and wheezing.   Cardiovascular: Negative.  Negative for chest pain and palpitations.  Gastrointestinal:  Negative for abdominal pain, constipation, diarrhea, nausea and vomiting.  Genitourinary:  Negative for dysuria and frequency.  Musculoskeletal:  Negative for arthralgias, back pain, joint swelling and neck pain.  Skin:  Negative for rash.       Multiple moles and skin tags  Neurological: Negative.  Negative for tremors and numbness.  Hematological:  Negative for adenopathy. Does not bruise/bleed easily.  Psychiatric/Behavioral:  Negative for behavioral problems (Depression), sleep disturbance and suicidal ideas. The patient is not nervous/anxious.     Vital Signs: BP 130/75 Comment: 150/76  Pulse 81   Temp 98.4 F (36.9 C)   Resp 16   Ht 5\' 7"  (1.702 m)   Wt 241 lb 9.6 oz (109.6 kg)   SpO2 95%   BMI 37.84 kg/m    Physical Exam Vitals reviewed.  Constitutional:      Appearance: Normal appearance. She is obese.  HENT:     Head: Normocephalic and atraumatic.  Eyes:     Pupils: Pupils are equal, round, and reactive to light.  Cardiovascular:     Rate and Rhythm: Normal rate and regular rhythm.  Pulmonary:     Effort: Pulmonary effort is normal. No respiratory distress.  Neurological:     Mental Status: She is alert and oriented to person, place, and time.  Psychiatric:        Mood and  Affect: Mood normal.        Behavior: Behavior normal.        Assessment/Plan: 1. Type 2 diabetes mellitus with other specified complication, without long-term current use of insulin (HCC) (Primary) A1c is improved, stable. Continue diet and lifestyle modifications  - POCT glycosylated hemoglobin (Hb A1C)  2. Postmenopausal bleeding Pelvic ultrasound ordered, follow up in office for results. Wants to wait a few weeks before doing a pap smear to see if the period comes back in a few weeks.  - US  Pelvic Complete With Transvaginal; Future  3. Hypertension associated with diabetes (HCC) Continue chlorthalidone and amlodipine as prescribed.  - chlorthalidone (HYGROTON) 25 MG tablet; Take 1 tablet (25 mg total) by mouth daily.  Dispense: 90 tablet; Refill: 1  4. Hypokalemia Continue potassium supplement as prescribed.  - potassium chloride SA (KLOR-CON M) 20 MEQ tablet; Take 1 tablet (20 mEq total) by mouth 2 (two) times daily.  Dispense: 180 tablet; Refill: 3  5. Multiple atypical skin moles Referred to dermatology  - Ambulatory referral to Dermatology  6. Multiple acquired skin tags Referred to dermatology  - Ambulatory referral to Dermatology   General Counseling: elgene coral understanding of the findings of todays visit and agrees with plan of treatment. I have discussed any further diagnostic evaluation that may be needed or ordered today. We also reviewed her medications today. she has been encouraged to call the office with any questions or concerns that should arise related to todays visit.    Orders Placed This Encounter  Procedures   US  Pelvic Complete With Transvaginal   Ambulatory referral to Dermatology   POCT glycosylated hemoglobin (Hb A1C)    Meds ordered this encounter  Medications   chlorthalidone (HYGROTON) 25 MG tablet    Sig: Take 1 tablet (25 mg total) by mouth daily.    Dispense:  90 tablet    Refill:  1    This prescription was filled on  07/15/2022. Any refills authorized will be placed on file.  potassium chloride SA (KLOR-CON M) 20 MEQ tablet    Sig: Take 1 tablet (20 mEq total) by mouth 2 (two) times daily.    Dispense:  180 tablet    Refill:  3    Return in about 6 months (around 06/23/2024) for F/U, Recheck A1C, [place] Hert PCP.   Total time spent:30 Minutes Time spent includes review of chart, medications, test results, and follow up plan with the patient.   [place] Controlled Substance Database was reviewed by me.  This patient was seen by Laurence Pons, FNP-C in collaboration with Dr. Verneta Gone as a part of collaborative care agreement.   Trease Bremner R. Bobbi Burow, MSN, FNP-C Internal medicine

## 2023-12-23 NOTE — Telephone Encounter (Signed)
 Awaiting 12/23/23 office notes for Dermatology referral-Toni

## 2023-12-27 ENCOUNTER — Encounter: Payer: Self-pay | Admitting: Nurse Practitioner

## 2023-12-30 ENCOUNTER — Other Ambulatory Visit

## 2024-01-07 ENCOUNTER — Ambulatory Visit
Admission: RE | Admit: 2024-01-07 | Discharge: 2024-01-07 | Disposition: A | Discharge status: Home / Self Care | Source: Ambulatory Visit | Attending: Nurse Practitioner | Admitting: Nurse Practitioner

## 2024-01-07 DIAGNOSIS — N950 Postmenopausal bleeding: Secondary | ICD-10-CM

## 2024-01-14 ENCOUNTER — Other Ambulatory Visit (INDEPENDENT_AMBULATORY_CARE_PROVIDER_SITE_OTHER): Payer: Self-pay

## 2024-01-14 ENCOUNTER — Encounter: Payer: Self-pay | Admitting: Orthopaedic Surgery

## 2024-01-14 ENCOUNTER — Ambulatory Visit: Payer: Medicare HMO | Admitting: Orthopaedic Surgery

## 2024-01-14 DIAGNOSIS — Z96651 Presence of right artificial knee joint: Secondary | ICD-10-CM

## 2024-01-14 NOTE — Progress Notes (Signed)
 The patient is now 11 months status post a right total knee replacement to treat severe right knee arthritis.  She has a remote history of a left knee replacement done elsewhere.  She says she is doing well overall.  On exam her right knee has full extension and almost full flexion.  It feels ligamentously stable and there is no swelling.  2 views of the right knee show well-seated press-fit total knee arthroplasty with no complicating features.  At this point follow-up for her right knee can be as needed.  If she does develop any issues with the right knee or her left knee or other musculoskeletal issues she knows to reach out and let us  know.

## 2024-03-09 ENCOUNTER — Telehealth: Payer: Self-pay

## 2024-03-10 ENCOUNTER — Other Ambulatory Visit: Payer: Self-pay

## 2024-03-10 DIAGNOSIS — E876 Hypokalemia: Secondary | ICD-10-CM

## 2024-03-10 MED ORDER — POTASSIUM CHLORIDE CRYS ER 20 MEQ PO TBCR: 20.0000 meq | EXTENDED_RELEASE_TABLET | Freq: Three times a day (TID) | ORAL | 0 refills | Status: DC

## 2024-03-10 NOTE — Telephone Encounter (Signed)
 Pt  called back that she is taking potassium 2 times daily as per alyssa advised that take potassium 20 meq 3 times daily and repeat labs in 2 weeks and labs already order

## 2024-03-10 NOTE — Telephone Encounter (Signed)
 Left message for patient to give office a call.

## 2024-03-23 LAB — BASIC METABOLIC PANEL WITH GFR
BUN/Creatinine Ratio: 20 (ref 12–28)
BUN: 15 mg/dL (ref 8–27)
CO2: 23 mmol/L (ref 20–29)
Calcium: 9.6 mg/dL (ref 8.7–10.3)
Chloride: 100 mmol/L (ref 96–106)
Creatinine, Ser: 0.74 mg/dL (ref 0.57–1.00)
Glucose: 93 mg/dL (ref 70–99)
Potassium: 3.6 mmol/L (ref 3.5–5.2)
Sodium: 140 mmol/L (ref 134–144)
eGFR: 88 mL/min/1.73 (ref >59)

## 2024-03-25 ENCOUNTER — Ambulatory Visit: Admitting: Nurse Practitioner

## 2024-03-26 ENCOUNTER — Encounter: Payer: Self-pay | Admitting: Nurse Practitioner

## 2024-03-26 ENCOUNTER — Telehealth (INDEPENDENT_AMBULATORY_CARE_PROVIDER_SITE_OTHER): Admitting: Nurse Practitioner

## 2024-03-26 VITALS — Resp 16 | Ht 67.0 in

## 2024-03-26 DIAGNOSIS — I152 Hypertension secondary to endocrine disorders: Secondary | ICD-10-CM

## 2024-03-26 DIAGNOSIS — E876 Hypokalemia: Secondary | ICD-10-CM | POA: Diagnosis not present

## 2024-03-26 DIAGNOSIS — E1159 Type 2 diabetes mellitus with other circulatory complications: Secondary | ICD-10-CM

## 2024-03-26 MED ORDER — OLMESARTAN MEDOXOMIL-HCTZ 20-12.5 MG PO TABS: 1.0000 | ORAL_TABLET | Freq: Every day | ORAL | 1 refills | Status: DC

## 2024-03-26 MED ORDER — POTASSIUM CHLORIDE CRYS ER 20 MEQ PO TBCR: 20.0000 meq | EXTENDED_RELEASE_TABLET | Freq: Two times a day (BID) | ORAL | 5 refills | Status: DC

## 2024-03-26 NOTE — Progress Notes (Signed)
 [facility] [address] [place], [place] [address]  Internal MEDICINE  Telephone Visit  Patient Name: Jill Banks  [id number]  [id number]  Date of Service: 03/26/2024  I connected with the patient at 1230 by telephone and verified the patients identity using two identifiers.   I discussed the limitations, risks, security and privacy concerns of performing an evaluation and management service by telephone and the availability of in person appointments. I also discussed with the patient that there may be a patient responsible charge related to the service.  The patient expressed understanding and agrees to proceed.    Chief Complaint  Patient presents with   Telephone Assessment    F/u    Telephone Screen    F/u    HPI Krystalyn presents for a telehealth virtual visit for lab results  Patient had labs done in late June with her podiatrist.  Potassium level is normal now, previously was 3.1 and was 2.6 at its lowest.  The chlorthalidone  is most likely causing the potassium level to drop.  Amlodipine  is causing lower extremity edema. May need to change her blood pressure medication.    Current Medication: Outpatient Encounter Medications as of 03/26/2024  Medication Sig   olmesartan -hydrochlorothiazide  (BENICAR  HCT) 20-12.5 MG tablet Take 1 tablet by mouth daily. (Patient not taking: Reported on 03/29/2024)   Accu-Chek Softclix Lancets lancets Use as instructed to check blood sugars once daily   bisoprolol  (ZEBETA ) 10 MG tablet Take 1 tablet (10 mg total) by mouth daily.   Blood Glucose Monitoring Suppl (ONETOUCH VERIO FLEX SYSTEM) w/Device KIT    calcitRIOL  (ROCALTROL ) 0.25 MCG capsule TAKE (1) CAPSULE BY MOUTH ONCE DAILY.   cyanocobalamin  (VITAMIN B12) 1000 MCG tablet TAKE ONE TABLET BY MOUTH EVERY MORNING   diclofenac sodium (VOLTAREN) 1 % GEL Apply 2 g topically 4 (four) times daily as needed (knee pain).   glucose blood test strip Use as instructed to check blood  sugars once daily E11.65   ibuprofen  (ADVIL ) 800 MG tablet Take 800 mg by mouth every 8 (eight) hours as needed for mild pain (pain score 1-3).   potassium chloride  SA (KLOR-CON  M) 20 MEQ tablet Take 1 tablet (20 mEq total) by mouth 2 (two) times daily.   [DISCONTINUED] amLODipine  (NORVASC ) 5 MG tablet Take 1 tablet (5 mg total) by mouth daily.   [DISCONTINUED] chlorthalidone  (HYGROTON ) 25 MG tablet Take 1 tablet (25 mg total) by mouth daily.   [DISCONTINUED] potassium chloride  SA (KLOR-CON  M) 20 MEQ tablet Take 1 tablet (20 mEq total) by mouth 3 (three) times daily.   No facility-administered encounter medications on file as of 03/26/2024.    Surgical History: Past Surgical History:  Procedure Laterality Date   APPLICATION OF WOUND VAC 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   Procedure: APPLICATION OF WOUND VAC;  Surgeon: Kathlynn Sharper, MD;  Location: [facility] ORS;  Service: Orthopedics;  Laterality: Left;  #[id number]   COLONOSCOPY WITH PROPOFOL  N/A 09/25/2021   Procedure: COLONOSCOPY WITH PROPOFOL ;  Surgeon: Unk Corinn Skiff, MD;  Location: [facility] ENDOSCOPY;  Service: Gastroenterology;  Laterality: N/A;   DILATATION & CURETTAGE/HYSTEROSCOPY WITH MYOSURE  10/17/2020   Procedure: DILATATION & CURETTAGE/HYSTEROSCOPY WITH MYOSURE POLYPECTOMY;  Surgeon: Victor Claudell JONELLE, MD;  Location: [facility] ORS;  Service: Gynecology;;   DILATION AND CURETTAGE OF UTERUS     x 3   TOE SURGERY Left    second toe   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   Procedure: LEFT TOTAL KNEE ARTHROPLASTY;  Surgeon: Kathlynn Sharper, MD;  Location: [facility] ORS;  Service: Orthopedics;  Laterality: Left;   TOTAL KNEE ARTHROPLASTY Right 02/14/2023   Procedure: RIGHT TOTAL KNEE ARTHROPLASTY;  Surgeon: Vernetta Lonni GRADE, MD;  Location: [facility] ORS;  Service: Orthopedics;  Laterality: Right;   XI ROBOTIC LAPAROSCOPIC ASSISTED APPENDECTOMY N/A 10/01/2023   Procedure: XI ROBOTIC LAPAROSCOPIC ASSISTED APPENDECTOMY;  Surgeon: Kallie Manuelita BROCKS, MD;  Location: [facility]  ORS;  Service: General;  Laterality: N/A;    Medical History: Past Medical History:  Diagnosis Date   Anxiety    Arthritis    Blood dyscrasia    Thrombocytopenia   Bruises easily    COVID-19 04/2019   Diabetes mellitus without complication (HCC)    Headache    HLD (hyperlipidemia)    Hypertension    Leg pain    Pneumonia    about 5 yrs ago   Sleep apnea    no CPAP    Family History: Family History  Problem Relation Age of Onset   Hypertension Father    Stroke Father    Hypertension Brother    Stroke Brother    Hyperlipidemia Brother    Breast cancer Maternal Aunt    Asthma Son     Social History   Socioeconomic History   Marital status: Married    Spouse name: Not on file   Number of children: Not on file   Years of education: Not on file   Highest education level: Not on file  Occupational History   Not on file  Tobacco Use   Smoking status: Former    Current packs/day: 0.00    Average packs/day: 0.3 packs/day for 2.0 years (0.5 ttl pk-yrs)    Types: Cigarettes    Start date: 2007    Quit date: 2009    Years since quitting: 16.6    Passive exposure: Never   Smokeless tobacco: Never  Vaping Use   Vaping status: Never Used  Substance and Sexual Activity   Alcohol use: Yes    Comment: ocassionally   Drug use: No   Sexual activity: Yes  Other Topics Concern   Not on file  Social History Narrative   Not on file   Social Drivers of Health   Financial Resource Strain: Not on file  Food Insecurity: No Food Insecurity (10/02/2023)   Hunger Vital Sign    Worried About Running Out of Food in the Last Year: Never true    Ran Out of Food in the Last Year: Never true  Transportation Needs: No Transportation Needs (10/02/2023)   PRAPARE - Administrator, Civil Service (Medical): No    Lack of Transportation (Non-Medical): No  Physical Activity: Not on file  Stress: Not on file  Social Connections: Unknown (10/02/2023)   Social Connection and  Isolation Panel    Frequency of Communication with Friends and Family: More than three times a week    Frequency of Social Gatherings with Friends and Family: More than three times a week    Attends Religious Services: Not on Insurance claims handler of Clubs or Organizations: Yes    Attends Banker Meetings: More than 4 times per year    Marital Status: Married  Catering manager Violence: Not At Risk (10/02/2023)   Humiliation, Afraid, Rape, and Kick questionnaire    Fear of Current or Ex-Partner: No    Emotionally Abused: No    Physically Abused: No    Sexually Abused: No      Review of  Systems  Constitutional:  Positive for fatigue.  HENT: Negative.    Respiratory: Negative.  Negative for cough, chest tightness, shortness of breath and wheezing.   Cardiovascular: Negative.  Negative for chest pain and palpitations.  Gastrointestinal: Negative.   Musculoskeletal: Negative.     Vital Signs: Resp 16   Ht 5' 7 (1.702 m)   BMI 37.84 kg/m    Observation/Objective: She is alert and oriented. No acute distress noted.     Assessment/Plan: 1. Hypokalemia (Primary) Continue potassium supplement as prescribed. Repeat lab ordered.  - potassium chloride  SA (KLOR-CON  M) 20 MEQ tablet; Take 1 tablet (20 mEq total) by mouth 2 (two) times daily.  Dispense: 60 tablet; Refill: 5 - Basic Metabolic Panel (BMET)  2. Hypertension associated with diabetes (HCC) Stop amlodipine  and chlorthalidone . Start olmesartan -hydrochlorothiazide  as prescribed.    General Counseling: shianne zeiser understanding of the findings of today's phone visit and agrees with plan of treatment. I have discussed any further diagnostic evaluation that may be needed or ordered today. We also reviewed her medications today. she has been encouraged to call the office with any questions or concerns that should arise related to todays visit.  Return in about 1 month (around 04/26/2024) for F/U, eval new med,  BP check, Labs, Khyan Oats PCP.   Orders Placed This Encounter  Procedures   Basic Metabolic Panel (BMET)    Meds ordered this encounter  Medications   potassium chloride  SA (KLOR-CON  M) 20 MEQ tablet    Sig: Take 1 tablet (20 mEq total) by mouth 2 (two) times daily.    Dispense:  60 tablet    Refill:  5   olmesartan -hydrochlorothiazide  (BENICAR  HCT) 20-12.5 MG tablet    Sig: Take 1 tablet by mouth daily.    Dispense:  90 tablet    Refill:  1    Discontinue amlodipine  and chlorthalidone , fill new script today asap    Time spent:20 Minutes Time spent with patient included reviewing progress notes, labs, imaging studies, and discussing plan for follow up.  [place] Controlled Substance Database was reviewed by me for overdose risk score (ORS) if appropriate.  This patient was seen by Mardy Maxin, FNP-C in collaboration with Dr. Sigrid Bathe as a part of collaborative care agreement.  Ardyce Heyer R. Maxin, MSN, FNP-C Internal medicine

## 2024-03-29 ENCOUNTER — Ambulatory Visit: Admitting: Dermatology

## 2024-03-29 ENCOUNTER — Encounter: Payer: Self-pay | Admitting: Dermatology

## 2024-03-29 VITALS — BP 114/73 | HR 65

## 2024-03-29 DIAGNOSIS — B079 Viral wart, unspecified: Secondary | ICD-10-CM

## 2024-03-29 DIAGNOSIS — L720 Epidermal cyst: Secondary | ICD-10-CM | POA: Diagnosis not present

## 2024-03-29 DIAGNOSIS — L820 Inflamed seborrheic keratosis: Secondary | ICD-10-CM | POA: Diagnosis not present

## 2024-03-29 DIAGNOSIS — L729 Follicular cyst of the skin and subcutaneous tissue, unspecified: Secondary | ICD-10-CM

## 2024-03-29 DIAGNOSIS — D2361 Other benign neoplasm of skin of right upper limb, including shoulder: Secondary | ICD-10-CM

## 2024-03-29 DIAGNOSIS — D239 Other benign neoplasm of skin, unspecified: Secondary | ICD-10-CM

## 2024-03-29 DIAGNOSIS — L821 Other seborrheic keratosis: Secondary | ICD-10-CM | POA: Diagnosis not present

## 2024-03-29 NOTE — Patient Instructions (Addendum)
 Date: Mon Mar 29 2024  Dear Ms. Windt,  Thank you for visiting today. Here is a summary of the key instructions:  - Skin Care:   - Use salicylic acid face washes to slow down the formation of new dermatosis papulosa nigra (DPN).   - Apply Avene retinol at night to help with the benign growth on the face.   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  - Treatments:   - Apply numbing cream 1 hour before DPN removal, if scheduled.   - Keep Aquaphor on frozen areas after treatment.   - Consider using Aquaphor spray for easier application under breast.  - Follow-up:   - Return in 1 year to check on dermatofibromas.   - Schedule an appointment for DPN removal if desired (requires $100 deposit).   - Call for an emergency appointment if any spot starts to grow rapidly.  Please reach out if you have any questions or concerns.  Warm regards,  Dr. Delon Lenis [facility]    Cryotherapy Aftercare  Wash gently with soap and water  everyday.   Apply Vaseline and Band-Aid daily until healed.    Important Information   Due to recent changes in healthcare laws, you may see results of your pathology and/or laboratory studies on MyChart before the doctors have had a chance to review them. We understand that in some cases there may be results that are confusing or concerning to you. Please understand that not all results are received at the same time and often the doctors may need to interpret multiple results in order to provide you with the best plan of care or course of treatment. Therefore, we ask that you please give us  2 business days to thoroughly review all your results before contacting the office for clarification. Should we see a critical lab result, you will be contacted sooner.     If You Need Anything After Your Visit   If you have any questions or concerns for your doctor, please call our main line at [phone]. If no  one answers, please leave a voicemail as directed and we will return your call as soon as possible. Messages left after 4 pm will be answered the following business day.    You may also send us  a message via MyChart. We typically respond to MyChart messages within 1-2 business days.  For prescription refills, please ask your pharmacy to contact our office. Our fax number is [phone].  If you have an urgent issue when the clinic is closed that cannot wait until the next business day, you can page your doctor at the number below.     Please note that while we do our best to be available for urgent issues outside of office hours, we are not available 24/7.    If you have an urgent issue and are unable to reach us , you may choose to seek medical care at your doctor's office, retail clinic, urgent care center, or emergency room.   If you have a medical emergency, please immediately call 911 or go to the emergency department. In the event of inclement weather, please call our main line at [phone] for an update on the status of any delays or closures.  Dermatology Medication Tips: Please keep the boxes that topical medications come in in order to help keep track of the instructions about where and how to use these. Pharmacies typically print the medication instructions only on the  boxes and not directly on the medication tubes.   If your medication is too expensive, please contact our office at [phone] or send us  a message through MyChart.    We are unable to tell what your co-pay for medications will be in advance as this is different depending on your insurance coverage. However, we may be able to find a substitute medication at lower cost or fill out paperwork to get insurance to cover a needed medication.    If a prior authorization is required to get your medication covered by your insurance company, please allow us  1-2 business days to complete this process.   Drug prices often  vary depending on where the prescription is filled and some pharmacies may offer cheaper prices.   The website www.goodrx.com contains coupons for medications through different pharmacies. The prices here do not account for what the cost may be with help from insurance (it may be cheaper with your insurance), but the website can give you the price if you did not use any insurance.  - You can print the associated coupon and take it with your prescription to the pharmacy.  - You may also stop by our office during regular business hours and pick up a GoodRx coupon card.  - If you need your prescription sent electronically to a different pharmacy, notify our office through [facility] or by phone at [phone]

## 2024-03-29 NOTE — Progress Notes (Signed)
 New Patient Visit   Subjective  Jill Banks is a 68 y.o. female who presents for the following: Moles  Patient states she has moles located at the scattered throughout her body that she would like to have examined. Patient reports the areas have been there for several years. She reports the areas are bothersome. Patient reports the areas are itchy. Patient rates irritation 6 out of 10. She states that the areas have spread. Patient reports she has not previously been treated for these areas. Patient denies Hx of bx. Patient denies family history of skin cancer(s).  The following portions of the chart were reviewed this encounter and updated as appropriate: medications, allergies, medical history  Review of Systems:  No other skin or systemic complaints except as noted in HPI or Assessment and Plan.  Objective  Well appearing patient in no apparent distress; mood and affect are within normal limits.  A full examination was performed including scalp, head, eyes, ears, nose, lips, neck, chest, axillae, abdomen, back, buttocks, bilateral upper extremities, bilateral lower extremities, hands, feet, fingers, toes, fingernails, and toenails. All findings within normal limits unless otherwise noted below.   Relevant exam findings are noted in the Assessment and Plan.              Left Inframammary Fold (2), Neck - Anterior (3), Right Abdomen (side) - Upper, Right Upper Back Verrucous papules   Assessment & Plan   Dermatosis Papulosa Nigra (DPN) Skin Care: Dermatosis Papulosa Nigra can resolve with light electrodesiccation. Expectations: Dermatosis Papulosa Ferol are benign growths. No treatment is necessary. Plan: Cosmetic Quote The patient received the following cosmetic quote  Other Procedure(Skin Tags): Quote for cosmetic removal:  $200 to remove on 1 area Face $300 to remove on 2 areas Face and Neck $400 to removal on 3 areas    Treatment Plan: - Recommended washing  daily with SA for gentle exfoliation (Provided with LRP SA Wash Samples) - Prescribed Tretinoin 0.025% to apply nightly for preventive measure  SEBORRHEIC KERATOSIS located underneath Breast - Stuck-on, waxy, tan-brown papules and/or plaques  - Benign-appearing - Discussed benign etiology and prognosis. - Observe - Call for any changes  DERMATOFIBROMA Exam: Firm pink/brown papulenodule with dimple sign on Right upper arm  Treatment Plan: A dermatofibroma is a benign growth possibly related to trauma, such as an insect bite, cut from shaving, or inflamed acne-type bump.  Treatment options to remove include shave or excision with resulting scar and risk of recurrence.  Since benign-appearing and not bothersome, will observe for now.   FILIFORM WART Exam: verrucous papule(s)  Counseling Discussed viral / HPV (Human Papilloma Virus) etiology and risk of spread /infectivity to other areas of body as well as to other people.  Multiple treatments and methods may be required to clear warts and it is possible treatment may not be successful.  Treatment risks include discoloration; scarring and there is still potential for wart recurrence.  Treatment Plan: - Cryo Therapy Completed while in office today  EPIDERMAL INCLUSION CYST Exam: Subcutaneous nodule at Middle Forehead  Benign-appearing. Exam most consistent with an epidermal inclusion cyst. Discussed that a cyst is a benign growth that can grow over time and sometimes get irritated or inflamed. Recommend observation if it is not bothersome. Discussed option of surgical excision to remove it if it is growing, symptomatic, or other changes noted. Please call for new or changing lesions so they can be evaluated.  FILIFORM WART Neck - Posterior Destruction of lesion -  Neck - Posterior Complexity: simple   Destruction method: cryotherapy   Informed consent: discussed and consent obtained   Timeout:  patient name, date of birth, surgical  site, and procedure verified Lesion destroyed using liquid nitrogen: Yes   Cryotherapy cycles:  5 Post-procedure details: wound care instructions given    INFLAMED SEBORRHEIC KERATOSIS (7) Left Inframammary Fold (2), Neck - Anterior (3), Right Abdomen (side) - Upper, Right Upper Back Destruction of lesion - Left Inframammary Fold (2), Neck - Anterior (2), Right Abdomen (side) - Upper, Right Upper Back Complexity: simple   Destruction method: cryotherapy   Informed consent: discussed and consent obtained   Timeout:  patient name, date of birth, surgical site, and procedure verified Lesion destroyed using liquid nitrogen: Yes   Cryotherapy cycles:  15 Post-procedure details: wound care instructions given     Return in about 1 year (around 03/29/2025) for Dermatofibroma F/U.  I, Jetta Ager, am acting as Neurosurgeon for [employer], DO.  Documentation: I have reviewed the above documentation for accuracy and completeness, and I agree with the above.  Delon Lenis, DO

## 2024-04-16 ENCOUNTER — Telehealth: Payer: Self-pay

## 2024-04-25 ENCOUNTER — Encounter: Payer: Self-pay | Admitting: Nurse Practitioner

## 2024-04-26 MED ORDER — VALSARTAN-HYDROCHLOROTHIAZIDE 80-12.5 MG PO TABS: 1.0000 | ORAL_TABLET | Freq: Every day | ORAL | 3 refills | Status: DC

## 2024-04-26 NOTE — Telephone Encounter (Signed)
 Pt notified that she change BP med

## 2024-05-11 ENCOUNTER — Ambulatory Visit: Admitting: Internal Medicine

## 2024-05-11 ENCOUNTER — Encounter: Payer: Self-pay | Admitting: Internal Medicine

## 2024-05-11 ENCOUNTER — Telehealth: Payer: Self-pay | Admitting: Internal Medicine

## 2024-05-11 VITALS — BP 138/88 | HR 73 | Temp 98.7°F | Resp 16 | Ht 67.0 in | Wt 249.0 lb

## 2024-05-11 DIAGNOSIS — G4733 Obstructive sleep apnea (adult) (pediatric): Secondary | ICD-10-CM

## 2024-05-11 NOTE — Patient Instructions (Signed)

## 2024-05-11 NOTE — Telephone Encounter (Signed)
 SS order emailed to [facility]

## 2024-05-11 NOTE — Progress Notes (Signed)
 [facility] [address] [place], [place] [address]  Pulmonary Sleep Medicine   Office Visit Note  Patient Name: Jill Banks DOB: [birth year] MRN [id number]  Date of Service: 05/11/2024  Complaints/HPI: Patient states she to go back on the CPAP. She has a machine from previous but we do not know what pressure to set her on at this time. She is feeling more tired and fatigued during the daytime. She also notes that her headaches are worse especially in the mornings. Denies having chest pain and no palpitations  Office Spirometry Results:     ROS  General: (-) fever, (-) chills, (-) night sweats, (-) weakness Skin: (-) rashes, (-) itching,. Eyes: (-) visual changes, (-) redness, (-) itching. Nose and Sinuses: (-) nasal stuffiness or itchiness, (-) postnasal drip, (-) nosebleeds, (-) sinus trouble. Mouth and Throat: (-) sore throat, (-) hoarseness. Neck: (-) swollen glands, (-) enlarged thyroid, (-) neck pain. Respiratory: - cough, (-) bloody sputum, - shortness of breath, - wheezing. Cardiovascular: - ankle swelling, (-) chest pain. Lymphatic: (-) lymph node enlargement. Neurologic: (-) numbness, (-) tingling. Psychiatric: (-) anxiety, (-) depression   Current Medication: Outpatient Encounter Medications as of 05/11/2024  Medication Sig   Accu-Chek Softclix Lancets lancets Use as instructed to check blood sugars once daily   bisoprolol  (ZEBETA ) 10 MG tablet Take 1 tablet (10 mg total) by mouth daily.   Blood Glucose Monitoring Suppl (ONETOUCH VERIO FLEX SYSTEM) w/Device KIT    calcitRIOL  (ROCALTROL ) 0.25 MCG capsule TAKE (1) CAPSULE BY MOUTH ONCE DAILY.   cyanocobalamin  (VITAMIN B12) 1000 MCG tablet TAKE ONE TABLET BY MOUTH EVERY MORNING   diclofenac sodium (VOLTAREN) 1 % GEL Apply 2 g topically 4 (four) times daily as needed (knee pain).   glucose blood test strip Use as instructed to check blood sugars once daily E11.65   ibuprofen  (ADVIL ) 800 MG tablet Take  800 mg by mouth every 8 (eight) hours as needed for mild pain (pain score 1-3).   potassium chloride  SA (KLOR-CON  M) 20 MEQ tablet Take 1 tablet (20 mEq total) by mouth 2 (two) times daily.   valsartan -hydrochlorothiazide  (DIOVAN -HCT) 80-12.5 MG tablet Take 1 tablet by mouth daily.   No facility-administered encounter medications on file as of 05/11/2024.    Surgical History: Past Surgical History:  Procedure Laterality Date   APPLICATION OF WOUND VAC 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   Procedure: APPLICATION OF WOUND VAC;  Surgeon: Kathlynn Sharper, MD;  Location: [facility] ORS;  Service: Orthopedics;  Laterality: Left;  #[id number]   COLONOSCOPY WITH PROPOFOL  N/A 09/25/2021   Procedure: COLONOSCOPY WITH PROPOFOL ;  Surgeon: Unk Corinn Skiff, MD;  Location: [facility] ENDOSCOPY;  Service: Gastroenterology;  Laterality: N/A;   DILATATION & CURETTAGE/HYSTEROSCOPY WITH MYOSURE  10/17/2020   Procedure: DILATATION & CURETTAGE/HYSTEROSCOPY WITH MYOSURE POLYPECTOMY;  Surgeon: Victor Claudell JONELLE, MD;  Location: [facility] ORS;  Service: Gynecology;;   DILATION AND CURETTAGE OF UTERUS     x 3   TOE SURGERY Left    second toe   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   Procedure: LEFT TOTAL KNEE ARTHROPLASTY;  Surgeon: Kathlynn Sharper, MD;  Location: [facility] ORS;  Service: Orthopedics;  Laterality: Left;   TOTAL KNEE ARTHROPLASTY Right 02/14/2023   Procedure: RIGHT TOTAL KNEE ARTHROPLASTY;  Surgeon: Vernetta Lonni GRADE, MD;  Location: [facility] ORS;  Service: Orthopedics;  Laterality: Right;   XI ROBOTIC LAPAROSCOPIC ASSISTED APPENDECTOMY N/A 10/01/2023   Procedure: XI ROBOTIC LAPAROSCOPIC ASSISTED APPENDECTOMY;  Surgeon: Kallie Manuelita BROCKS, MD;  Location: [facility]  ORS;  Service: General;  Laterality: N/A;    Medical History: Past Medical History:  Diagnosis Date   Anxiety    Arthritis    Blood dyscrasia    Thrombocytopenia   Bruises easily    COVID-19 04/2019   Diabetes mellitus without complication (HCC)    Headache    HLD  (hyperlipidemia)    Hypertension    Leg pain    Pneumonia    about 5 yrs ago   Sleep apnea    no CPAP    Family History: Family History  Problem Relation Age of Onset   Hypertension Father    Stroke Father    Hypertension Brother    Stroke Brother    Hyperlipidemia Brother    Breast cancer Maternal Aunt    Asthma Son     Social History: Social History   Socioeconomic History   Marital status: Married    Spouse name: Not on file   Number of children: Not on file   Years of education: Not on file   Highest education level: Not on file  Occupational History   Not on file  Tobacco Use   Smoking status: Former    Current packs/day: 0.00    Average packs/day: 0.3 packs/day for 2.0 years (0.5 ttl pk-yrs)    Types: Cigarettes    Start date: 2007    Quit date: 2009    Years since quitting: 16.6    Passive exposure: Never   Smokeless tobacco: Never  Vaping Use   Vaping status: Never Used  Substance and Sexual Activity   Alcohol use: Yes    Comment: ocassionally   Drug use: No   Sexual activity: Yes  Other Topics Concern   Not on file  Social History Narrative   Not on file   Social Drivers of Health   Financial Resource Strain: Not on file  Food Insecurity: No Food Insecurity (10/02/2023)   Hunger Vital Sign    Worried About Running Out of Food in the Last Year: Never true    Ran Out of Food in the Last Year: Never true  Transportation Needs: No Transportation Needs (10/02/2023)   PRAPARE - Administrator, Civil Service (Medical): No    Lack of Transportation (Non-Medical): No  Physical Activity: Not on file  Stress: Not on file  Social Connections: Unknown (10/02/2023)   Social Connection and Isolation Panel    Frequency of Communication with Friends and Family: More than three times a week    Frequency of Social Gatherings with Friends and Family: More than three times a week    Attends Religious Services: Not on Insurance claims handler of Clubs  or Organizations: Yes    Attends Banker Meetings: More than 4 times per year    Marital Status: Married  Catering manager Violence: Not At Risk (10/02/2023)   Humiliation, Afraid, Rape, and Kick questionnaire    Fear of Current or Ex-Partner: No    Emotionally Abused: No    Physically Abused: No    Sexually Abused: No    Vital Signs: Blood pressure 138/88, pulse 73, temperature 98.7 F (37.1 C), resp. rate 16, height 5' 7 (1.702 m), weight 249 lb (112.9 kg), SpO2 97%.  Examination: General Appearance: The patient is well-developed, well-nourished, and in no distress. Skin: Gross inspection of skin unremarkable. Head: normocephalic, no gross deformities. Eyes: no gross deformities noted. ENT: ears appear grossly normal no exudates. Neck: Supple. No thyromegaly.  No LAD. Respiratory: no rhonchi noted. Cardiovascular: Normal S1 and S2 without murmur or rub. Extremities: No cyanosis. pulses are equal. Neurologic: Alert and oriented. No involuntary movements.  LABS: Recent Results (from the past 2160 hours)  Basic metabolic panel with GFR     Status: None   Collection Time: 03/22/24 10:34 AM  Result Value Ref Range   Glucose 93 70 - 99 mg/dL   BUN 15 8 - 27 mg/dL   Creatinine, Ser 9.25 0.57 - 1.00 mg/dL   eGFR 88 >40 fO/fpw/8.26   BUN/Creatinine Ratio 20 12 - 28   Sodium 140 134 - 144 mmol/L   Potassium 3.6 3.5 - 5.2 mmol/L   Chloride 100 96 - 106 mmol/L   CO2 23 20 - 29 mmol/L   Calcium  9.6 8.7 - 10.3 mg/dL    Radiology: US  Pelvic Complete With Transvaginal Result Date: 01/18/2024 CLINICAL DATA:  Postmenopausal bleeding for 3 days. EXAM: TRANSABDOMINAL AND TRANSVAGINAL ULTRASOUND OF PELVIS TECHNIQUE: Both transabdominal and transvaginal ultrasound examinations of the pelvis were performed. Transabdominal technique was performed for global imaging of the pelvis including uterus, ovaries, adnexal regions, and pelvic cul-de-sac. It was necessary to proceed with  endovaginal exam following the transabdominal exam to visualize the endometrium and right ovary. COMPARISON:  CT abdomen pelvis 10/01/2023 and pelvic ultrasound 08/01/2020 FINDINGS: Uterus Measurements: 13.9 x 9.3 x 12.5 cm = volume: 848 mL. Numerous uterine fibroids with the largest measuring up to 6.0 cm. Endometrium Thickness: 10 mm.  No focal abnormality visualized. Right ovary Not visualized. Left ovary Measurements: 3.5 x 2.6 x 2.8 cm = volume: 13.7 mL. Normal appearance/no adnexal mass. Other findings No abnormal free fluid. IMPRESSION: Limited examination due to enlarged fibroid uterus. Numerous uterine fibroids measuring up to 6.0 cm grossly similar to 08/01/2020. Postmenopausal bleeding may be related to submucosal fibroids. Endometrial carcinoma is not excluded. Gynecology referral is recommended if not obtained. Electronically Signed   By: Norman Gatlin M.D.   On: 01/18/2024 23:44    No results found.  No results found.  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   Thrombocytopenia (HCC) 10/15/2022   Endometrial polyp    Postmenopausal bleeding    Uterine enlargement 09/17/2020   BMI 40.0-44.9, adult (HCC) 09/17/2020   Uterine leiomyoma 08/26/2020   Aortic atherosclerosis (HCC) 08/26/2020   Diastasis of rectus abdominis 08/08/2020   Encounter for health maintenance examination with abnormal findings 07/05/2020   Bone spur of right ankle 07/05/2020   S/P TKR (total knee replacement) using cement, left 07/27/2019   Encounter for general adult medical examination with abnormal findings 07/01/2019   Adjustment insomnia 07/01/2019   Encounter for screening mammogram for malignant neoplasm of breast 07/01/2019   Routine cervical  smear 07/01/2019   Ventral hernia without obstruction or gangrene 07/01/2019   Pain and swelling of ankle, right 01/13/2019   Inflammatory polyarthritis (HCC) 12/18/2017   Impaired fasting glucose 09/23/2017   Obesity, unspecified 09/23/2017   Hypokalemia 09/23/2017   Melanocytic nevi, unspecified 09/23/2017   Pain in right knee 09/23/2017   Dysuria 09/23/2017   Pain in right ankle and joints of right foot 09/23/2017   Hypertension associated with diabetes (HCC) 09/23/2017   Other fatigue 09/23/2017   Vitamin D  deficiency, unspecified 09/23/2017  Iron deficiency anemia, unspecified 09/23/2017   OSA on CPAP 09/23/2017    1. OSA (obstructive sleep apnea) (Primary) Patient has history of OSA and needs to get a follow up study done to reassess. Patient will be schedule for PSG - PSG Sleep Study; Future  2. Obesity, morbid (HCC) Obesity Counseling: Had a lengthy discussion regarding patients BMI and weight issues. Patient was instructed on portion control as well as increased activity. States she is not able to do much due to knee problems  General Counseling: I have discussed the findings of the evaluation and examination with Donzell.  I have also discussed any further diagnostic evaluation thatmay be needed or ordered today. Cornelia verbalizes understanding of the findings of todays visit. We also reviewed her medications today and discussed drug interactions and side effects including but not limited excessive drowsiness and altered mental states. We also discussed that there is always a risk not just to her but also people around her. she has been encouraged to call the office with any questions or concerns that should arise related to todays visit.  No orders of the defined types were placed in this encounter.    Time spent: 7  I have personally obtained a history, examined the patient, evaluated laboratory and imaging results, formulated the assessment and plan and placed orders.     Elfreda DELENA Bathe, MD [facility] Pulmonary and Critical Care Sleep medicine

## 2024-05-12 LAB — BASIC METABOLIC PANEL WITH GFR
BUN/Creatinine Ratio: 17 (ref 12–28)
BUN: 12 mg/dL (ref 8–27)
CO2: 25 mmol/L (ref 20–29)
Calcium: 9.5 mg/dL (ref 8.7–10.3)
Chloride: 100 mmol/L (ref 96–106)
Creatinine, Ser: 0.71 mg/dL (ref 0.57–1.00)
Glucose: 87 mg/dL (ref 70–99)
Potassium: 3.8 mmol/L (ref 3.5–5.2)
Sodium: 140 mmol/L (ref 134–144)
eGFR: 93 mL/min/1.73 (ref >59)

## 2024-05-18 ENCOUNTER — Other Ambulatory Visit: Payer: Self-pay

## 2024-05-18 MED ORDER — VITAMIN B-12 1000 MCG PO TABS: 1000.0000 ug | ORAL_TABLET | Freq: Every morning | ORAL | 1 refills | Status: AC

## 2024-06-03 ENCOUNTER — Telehealth: Payer: Self-pay | Admitting: Internal Medicine

## 2024-06-03 NOTE — Telephone Encounter (Signed)
 SS appointment 06/10/24 @ [facility]

## 2024-06-10 ENCOUNTER — Encounter (INDEPENDENT_AMBULATORY_CARE_PROVIDER_SITE_OTHER): Payer: Self-pay | Admitting: Internal Medicine

## 2024-06-10 DIAGNOSIS — G4733 Obstructive sleep apnea (adult) (pediatric): Secondary | ICD-10-CM | POA: Diagnosis not present

## 2024-06-22 ENCOUNTER — Encounter: Payer: Self-pay | Admitting: Nurse Practitioner

## 2024-06-22 ENCOUNTER — Ambulatory Visit (INDEPENDENT_AMBULATORY_CARE_PROVIDER_SITE_OTHER): Admitting: Nurse Practitioner

## 2024-06-22 VITALS — BP 138/88 | HR 69 | Temp 97.5°F | Resp 16 | Ht 67.0 in | Wt 246.0 lb

## 2024-06-22 DIAGNOSIS — E782 Mixed hyperlipidemia: Secondary | ICD-10-CM

## 2024-06-22 DIAGNOSIS — E1159 Type 2 diabetes mellitus with other circulatory complications: Secondary | ICD-10-CM | POA: Diagnosis not present

## 2024-06-22 DIAGNOSIS — M064 Inflammatory polyarthropathy: Secondary | ICD-10-CM

## 2024-06-22 DIAGNOSIS — E538 Deficiency of other specified B group vitamins: Secondary | ICD-10-CM | POA: Diagnosis not present

## 2024-06-22 DIAGNOSIS — E559 Vitamin D deficiency, unspecified: Secondary | ICD-10-CM

## 2024-06-22 DIAGNOSIS — E1169 Type 2 diabetes mellitus with other specified complication: Secondary | ICD-10-CM

## 2024-06-22 DIAGNOSIS — I152 Hypertension secondary to endocrine disorders: Secondary | ICD-10-CM

## 2024-06-22 DIAGNOSIS — E785 Hyperlipidemia, unspecified: Secondary | ICD-10-CM

## 2024-06-22 DIAGNOSIS — G4733 Obstructive sleep apnea (adult) (pediatric): Secondary | ICD-10-CM

## 2024-06-22 LAB — POCT GLYCOSYLATED HEMOGLOBIN (HGB A1C): Hemoglobin A1C: 5.9 % — AB (ref 4.0–5.6)

## 2024-06-22 MED ORDER — VALSARTAN-HYDROCHLOROTHIAZIDE 80-12.5 MG PO TABS: 1.0000 | ORAL_TABLET | Freq: Every day | ORAL | 3 refills | Status: AC

## 2024-06-22 MED ORDER — BISOPROLOL FUMARATE 10 MG PO TABS: 10.0000 mg | ORAL_TABLET | Freq: Every day | ORAL | 3 refills | Status: AC

## 2024-06-22 MED ORDER — CALCITRIOL 0.25 MCG PO CAPS: ORAL_CAPSULE | ORAL | 5 refills | Status: AC

## 2024-06-22 NOTE — Progress Notes (Signed)
 [facility] [address] [place], [place] [address]  Internal MEDICINE  Office Visit Note  Patient Name: Jill Banks  [id number]  [id number]  Date of Service: 06/22/2024  Chief Complaint  Patient presents with   Diabetes   Hypertension   Hyperlipidemia   Follow-up    HPI Mikael presents for a follow-up visit for severe OSA, diabetes, joint pain, neck pain Severe OSA -- she has had her sleep study done and is waiting for her insurance to approve her CPAP titration which needs to be done ASAP. Her overall AHI was significantly elevated at 82.9 events per hour. This number increased to 97.7 events per hour when in supine position. Her oxygen saturation dropped as low as 68% during the study. She also had an elevated number or periodic limb movements per hour of sleep at 84.5 per hour. She had reduced sleep efficiency, elevated arousal index, increased awakenings, reduced percentage of slow wave sleep and no REM sleep. An emergent CPAP titration was recommended.  Diabetes -- A1c checked today, remains stable with no change at 5.9. due for urine microalbumin/creatinine ratio.  Increased joint pain in hands and neck pain -- family history of lupus and RA.  Hypertension -- controlled with bisoprolol  and valsartan -hydrochlorothiazide     Current Medication: Outpatient Encounter Medications as of 06/22/2024  Medication Sig   Accu-Chek Softclix Lancets lancets Use as instructed to check blood sugars once daily   bisoprolol  (ZEBETA ) 10 MG tablet Take 1 tablet (10 mg total) by mouth daily.   Blood Glucose Monitoring Suppl (ONETOUCH VERIO FLEX SYSTEM) w/Device KIT    calcitRIOL  (ROCALTROL ) 0.25 MCG capsule TAKE (1) CAPSULE BY MOUTH ONCE DAILY.   cyanocobalamin  (VITAMIN B12) 1000 MCG tablet Take 1 tablet (1,000 mcg total) by mouth every morning.   diclofenac sodium (VOLTAREN) 1 % GEL Apply 2 g topically 4 (four) times daily as needed (knee pain).   glucose blood test strip Use as  instructed to check blood sugars once daily E11.65   ibuprofen  (ADVIL ) 800 MG tablet Take 800 mg by mouth every 8 (eight) hours as needed for mild pain (pain score 1-3).   potassium chloride  SA (KLOR-CON  M) 20 MEQ tablet Take 1 tablet (20 mEq total) by mouth 2 (two) times daily.   valsartan -hydrochlorothiazide  (DIOVAN -HCT) 80-12.5 MG tablet Take 1 tablet by mouth daily.   [DISCONTINUED] bisoprolol  (ZEBETA ) 10 MG tablet Take 1 tablet (10 mg total) by mouth daily.   [DISCONTINUED] calcitRIOL  (ROCALTROL ) 0.25 MCG capsule TAKE (1) CAPSULE BY MOUTH ONCE DAILY.   [DISCONTINUED] valsartan -hydrochlorothiazide  (DIOVAN -HCT) 80-12.5 MG tablet Take 1 tablet by mouth daily.   No facility-administered encounter medications on file as of 06/22/2024.    Surgical History: Past Surgical History:  Procedure Laterality Date   APPLICATION OF WOUND VAC 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   Procedure: APPLICATION OF WOUND VAC;  Surgeon: Kathlynn Sharper, MD;  Location: [facility] ORS;  Service: Orthopedics;  Laterality: Left;  #[id number]   COLONOSCOPY WITH PROPOFOL  N/A 09/25/2021   Procedure: COLONOSCOPY WITH PROPOFOL ;  Surgeon: Unk Corinn Skiff, MD;  Location: [facility] ENDOSCOPY;  Service: Gastroenterology;  Laterality: N/A;   DILATATION & CURETTAGE/HYSTEROSCOPY WITH MYOSURE  10/17/2020   Procedure: DILATATION & CURETTAGE/HYSTEROSCOPY WITH MYOSURE POLYPECTOMY;  Surgeon: Victor Claudell JONELLE, MD;  Location: [facility] ORS;  Service: Gynecology;;   DILATION AND CURETTAGE OF UTERUS     x 3   TOE SURGERY Left    second toe   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   Procedure: LEFT TOTAL KNEE ARTHROPLASTY;  Surgeon: Kathlynn Sharper, MD;  Location: [facility] ORS;  Service: Orthopedics;  Laterality: Left;   TOTAL KNEE ARTHROPLASTY Right 02/14/2023   Procedure: RIGHT TOTAL KNEE ARTHROPLASTY;  Surgeon: Vernetta Lonni GRADE, MD;  Location: [facility] ORS;  Service: Orthopedics;  Laterality: Right;   XI ROBOTIC LAPAROSCOPIC ASSISTED APPENDECTOMY N/A 10/01/2023    Procedure: XI ROBOTIC LAPAROSCOPIC ASSISTED APPENDECTOMY;  Surgeon: Kallie Manuelita BROCKS, MD;  Location: [facility];  Service: General;  Laterality: N/A;    Medical History: Past Medical History:  Diagnosis Date   Anxiety    Arthritis    Blood dyscrasia    Thrombocytopenia   Bruises easily    COVID-19 04/2019   Diabetes mellitus without complication (HCC)    Headache    HLD (hyperlipidemia)    Hypertension    Leg pain    Pneumonia    about 5 yrs ago   Sleep apnea    no CPAP    Family History: Family History  Problem Relation Age of Onset   Hypertension Father    Stroke Father    Hypertension Brother    Stroke Brother    Hyperlipidemia Brother    Breast cancer Maternal Aunt    Asthma Son     Social History   Socioeconomic History   Marital status: Married    Spouse name: Not on file   Number of children: Not on file   Years of education: Not on file   Highest education level: Not on file  Occupational History   Not on file  Tobacco Use   Smoking status: Former    Current packs/day: 0.00    Average packs/day: 0.3 packs/day for 2.0 years (0.5 ttl pk-yrs)    Types: Cigarettes    Start date: 2007    Quit date: 2009    Years since quitting: 16.7    Passive exposure: Never   Smokeless tobacco: Never  Vaping Use   Vaping status: Never Used  Substance and Sexual Activity   Alcohol use: Yes    Comment: ocassionally   Drug use: No   Sexual activity: Yes  Other Topics Concern   Not on file  Social History Narrative   Not on file   Social Drivers of Health   Financial Resource Strain: Not on file  Food Insecurity: No Food Insecurity (10/02/2023)   Hunger Vital Sign    Worried About Running Out of Food in the Last Year: Never true    Ran Out of Food in the Last Year: Never true  Transportation Needs: No Transportation Needs (10/02/2023)   PRAPARE - Administrator, Civil Service (Medical): No    Lack of Transportation (Non-Medical): No  Physical  Activity: Not on file  Stress: Not on file  Social Connections: Unknown (10/02/2023)   Social Connection and Isolation Panel    Frequency of Communication with Friends and Family: More than three times a week    Frequency of Social Gatherings with Friends and Family: More than three times a week    Attends Religious Services: Not on file    Active Member of Clubs or Organizations: Yes    Attends Banker Meetings: More than 4 times per year    Marital Status: Married  Catering Manager Violence: Not At Risk (10/02/2023)   Humiliation, Afraid, Rape, and Kick questionnaire    Fear of Current or Ex-Partner: No    Emotionally Abused: No    Physically Abused: No    Sexually Abused: No  Review of Systems  Constitutional:  Negative for chills, fatigue and unexpected weight change.  HENT:  Negative for congestion, rhinorrhea, sneezing and sore throat.   Eyes:  Negative for redness.  Respiratory: Negative.  Negative for cough, chest tightness, shortness of breath and wheezing.   Cardiovascular: Negative.  Negative for chest pain and palpitations.  Gastrointestinal:  Negative for abdominal pain, constipation, diarrhea, nausea and vomiting.  Genitourinary:  Negative for dysuria and frequency.  Musculoskeletal:  Positive for arthralgias, back pain, myalgias and neck pain. Negative for joint swelling.  Skin:  Negative for rash.  Neurological: Negative.  Negative for tremors and numbness.  Hematological:  Negative for adenopathy. Does not bruise/bleed easily.  Psychiatric/Behavioral:  Negative for behavioral problems (Depression), sleep disturbance and suicidal ideas. The patient is not nervous/anxious.     Vital Signs: BP 138/88   Pulse 69   Temp (!) 97.5 F (36.4 C)   Resp 16   Ht 5' 7 (1.702 m)   Wt 246 lb (111.6 kg)   SpO2 97%   BMI 38.53 kg/m    Physical Exam Vitals reviewed.  Constitutional:      General: She is not in acute distress.    Appearance: Normal  appearance. She is obese. She is not ill-appearing.  HENT:     Head: Normocephalic and atraumatic.  Eyes:     Pupils: Pupils are equal, round, and reactive to light.  Cardiovascular:     Rate and Rhythm: Normal rate and regular rhythm.  Pulmonary:     Effort: Pulmonary effort is normal. No respiratory distress.  Musculoskeletal:     Cervical back: Spasms present. Pain with movement present. Decreased range of motion.     Lumbar back: Decreased range of motion.  Skin:    General: Skin is warm and dry.     Capillary Refill: Capillary refill takes less than 2 seconds.  Neurological:     Mental Status: She is alert and oriented to person, place, and time.     Cranial Nerves: No cranial nerve deficit.     Coordination: Coordination normal.     Gait: Gait normal.  Psychiatric:        Mood and Affect: Mood normal.        Behavior: Behavior normal.        Assessment/Plan: 1. Type 2 diabetes mellitus with other specified complication, without long-term current use of insulin  (HCC) (Primary) A1c is stable, no change at 5.9 today, urine sent for microalbumin/creatinine ratio and routine labs ordered. She is diet controlled, continue diabetic diet as previously discussed.  - POCT glycosylated hemoglobin (Hb A1C) - CBC with Differential/Platelet - CMP14+EGFR - Lipid Profile - Urine Microalbumin w/creat. ratio  2. Hypertension associated with diabetes (HCC) Stable, continue bisoprolol  and valsartan -hydrochlorothiazide  as prescribed. - bisoprolol  (ZEBETA ) 10 MG tablet; Take 1 tablet (10 mg total) by mouth daily.  Dispense: 90 tablet; Refill: 3 - valsartan -hydrochlorothiazide  (DIOVAN -HCT) 80-12.5 MG tablet; Take 1 tablet by mouth daily.  Dispense: 90 tablet; Refill: 3  3. Inflammatory polyarthritis (HCC) Routine and additional labs ordered. Consider rheumatology referral pending lab results  - Sed Rate (ESR) - C-reactive protein - ANA,IFA RA Diag Pnl w/rflx Tit/Patn - CBC with  Differential/Platelet - CMP14+EGFR - Vitamin D  (25 hydroxy) - B12 and Folate Panel - FANA Staining Patterns  4. Mixed hyperlipidemia Routine lab ordered  - Lipid Profile  5. B12 deficiency Routine labs ordered  - B12 and Folate Panel  6. Vitamin D  deficiency Routine lab ordered. Continue  calcitriol  as prescribed.  - Vitamin D  (25 hydroxy) - calcitRIOL  (ROCALTROL ) 0.25 MCG capsule; TAKE (1) CAPSULE BY MOUTH ONCE DAILY.  Dispense: 90 capsule; Refill: 5  7. OSA (obstructive sleep apnea) Our office will call [facility] to expedite the process of getting her CPAP titration study approved and scheduled.    General Counseling: onda kattner understanding of the findings of todays visit and agrees with plan of treatment. I have discussed any further diagnostic evaluation that may be needed or ordered today. We also reviewed her medications today. she has been encouraged to call the office with any questions or concerns that should arise related to todays visit.    Orders Placed This Encounter  Procedures   Sed Rate (ESR)   C-reactive protein   ANA,IFA RA Diag Pnl w/rflx Tit/Patn   CBC with Differential/Platelet   CMP14+EGFR   Lipid Profile   Vitamin D  (25 hydroxy)   B12 and Folate Panel   POCT glycosylated hemoglobin (Hb A1C)    Meds ordered this encounter  Medications   bisoprolol  (ZEBETA ) 10 MG tablet    Sig: Take 1 tablet (10 mg total) by mouth daily.    Dispense:  90 tablet    Refill:  3   calcitRIOL  (ROCALTROL ) 0.25 MCG capsule    Sig: TAKE (1) CAPSULE BY MOUTH ONCE DAILY.    Dispense:  90 capsule    Refill:  5   valsartan -hydrochlorothiazide  (DIOVAN -HCT) 80-12.5 MG tablet    Sig: Take 1 tablet by mouth daily.    Dispense:  90 tablet    Refill:  3    For future refills. Fill for 90 days.    Return in about 2 weeks (around 07/06/2024) for F/U, Labs, Blair Lundeen PCP.   Total time spent:30 Minutes Time spent includes review of chart, medications,  test results, and follow up plan with the patient.    Controlled Substance Database was reviewed by me.  This patient was seen by Mardy Maxin, FNP-C in collaboration with Dr. Sigrid Bathe as a part of collaborative care agreement.   Trellis Guirguis R. Maxin, MSN, FNP-C Internal medicine

## 2024-06-23 LAB — MICROALBUMIN / CREATININE URINE RATIO
Creatinine, Urine: 106.4 mg/dL
Microalb/Creat Ratio: 22 mg/g{creat} (ref 0–29)
Microalbumin, Urine: 23.5 ug/mL

## 2024-06-29 LAB — ANA,IFA RA DIAG PNL W/RFLX TIT/PATN

## 2024-06-30 NOTE — Procedures (Signed)
 [facility]  Polysomnogram Report Part I                                                               Phone: [phone] Fax: [phone]  Patient Name: Jill Banks, Jill Banks. Acquisition Number: [id number]  Date of Birth: [birth year] Acquisition Date: 06/10/2024  Referring Physician: Elfreda RONAL Bathe, MD     History: The patient is a 68 year old  who was referred for re-evaluation of obstructive sleep apnea. Medical History: anxiety, arthritis, blood dyscrasia, diabetes mellitus w/o complication, headache, HLD, hypertension, OSA.  Medications: potassium chloride , vitamin B12, calcitriol , bisoprolol , Advil , valsartan -hydrochlorothiazide .  Procedure: This routine overnight polysomnogram was performed on the [facility] 5 using the standard diagnostic protocol. This included 6 channels of EEG, 2 channels of EOG, chin EMG, bilateral anterior tibialis EMG, nasal/oral thermistor, PTAF (nasal pressure transducer), chest and abdominal wall movements, EKG, and pulse oximetry.  Description: The total recording time was 404.0 minutes. The total sleep time was 299.5 minutes. There were a total of 57.5 minutes of wakefulness after sleep onset for a reducedsleep efficiency of 74.1%. The latency to sleep onset was prolonged at 47.0 minutes. The R sleep onset latency was N/A. Sleep parameters, as a percentage of the total sleep time, demonstrated 12.0% of sleep was in N1 sleep, 85.3% N2, 2.7% N3 and 0.0% R sleep. There were a total of 154 arousals for an arousal index of 30.9 arousals per hour of sleep that was elevated.  Respiratory monitoring demonstrated   snoring . There were 414 apneas and hypopneas for an Apnea Hypopnea Index of 82.9 apneas and hypopneas per hour of sleep.   The average duration of the respiratory events was 11.6 seconds with a maximum duration of 23.0 seconds. The respiratory events occurred in all positions, however they were more frequent in the supine position with an AHI of 97.7. The  respiratory events were associated with peripheral oxygen desaturations on the average to 88%. The lowest oxygen desaturation associated with a respiratory event was 68%. Additionally, the baseline oxygen saturation during wakefulness was 94%, during NREM sleep averaged 92%, and during REM sleep averaged N/A. The total duration of oxygen < 90% was 53.1 minutes and <80% was 0.0 minutes.  Cardiac monitoring-  demonstrate transient cardiac decelerations associated with the apneas.  significant cardiac rhythm irregularities.   Periodic limb movement monitoring- demonstrated that there were 419 periodic limb movements for a periodic limb movement index of 83.9 periodic limb movements per hour of sleep. Quasi-periodic limb movements were observed during periods of wakefulness.   Impression: This routine overnight polysomnogram demonstrated significant obstructive sleep apnea with an overall Apnea Hypopnea Index of 82.9 apneas and hypopneas per hour of sleep, which increased to 97.7 in the supine position. The lowest desaturation was to 68%. The findings may underestimate the severity of the sleep apnea.   There was a highly elevated periodic limb movement index of 84.5 periodic limb movements per hour of sleep. Quasi-periodic limb movements were observed during periods of wakefulness. Sometimes these limb movements subside once the apnea is controlled. Clinical correlation is suggested.  reduced sleep efficiency with anelevated arousal index,increased awakenings, a reduced percentage of slow wave and no REM sleep. These findings would appear to be due to the combination of  obstructive sleep apnea and periodic limb movements.   Recommendations:    An emergent CPAP titration would be recommended due to the severity of the sleep apnea. Additionally, would recommend weight loss in a patient with a BMI of 39.0.     Elfreda RONAL Bathe, MD, [facility] Diplomate ABMS-Pulmonary, Critical Care and Sleep Medicine   Electronically reviewed and digitally signed  [facility] Polysomnogram Report Part II  Phone: [phone] Fax: [phone]  Patient last name Pettis Neck Size 16.0 in. Acquisition [id number]  Patient first name Jill Banks. Weight 249.0 lbs. Started 06/10/2024 at 10:01:16 PM  Birth date 1955-11-19 Height 67.0 in. Stopped 06/11/2024 at 5:04:04 AM  Age 11 BMI 39.0 lb./in2 Duration 404.0  Study Type Adult      Raford Sprang, RPSGT & [facility] [place]  Reviewed by: Kathe G. Henke, PhD, ABSM, FAASM Sleep Data: Lights Out: 10:14:16 PM Sleep Onset: 11:01:16 PM  Lights On: 4:58:16 AM Sleep Efficiency: 74.1 %  Total Recording Time: 404.0 min Sleep Latency (from Lights Off) 47.0 min  Total Sleep Time (TST): 299.5 min R Latency (from Sleep Onset): N/A  Sleep Period Time: 355.0 min Total number of awakenings: 21  Wake during sleep: 55.5 min Wake After Sleep Onset (WASO): 57.5 min   Sleep Data:         Arousal Summary: Stage  Latency from lights out (min) Latency from sleep onset (min) Duration (min) % Total Sleep Time  Normal values  N 1 47.0 0.0 36.0 12.0 (5%)  N 2 50.0 3.0 255.5 85.3 (50%)  N 3 119.5 72.5 8.0 2.7 (20%)  R N/A N/A 0.0 0.0 (25%)   Number Index  Spontaneous 14 2.8  Apneas & Hypopneas 93 18.6  RERAs 0 0.0       (Apneas & Hypopneas & RERAs)  (93) (18.6)  Limb Movement 50 10.0  Snore 0 0.0  TOTAL 157 31.5     Respiratory Data:  CA OA MA Apnea Hypopnea* A+ H RERA Total  Number 9 262 9 280 134 414 0 414  Mean Dur (sec) 10.5 11.2 14.9 11.3 12.3 11.6 0.0 11.6  Max Dur (sec) 14.5 23.0 21.0 23.0 23.0 23.0 0.0 23.0  Total Dur (min) 1.6 48.7 2.2 52.5 27.5 80.0 0.0 80.0  % of TST 0.5 16.3 0.7 17.5 9.2 26.7 0.0 26.7  Index (#/h TST) 1.8 52.5 1.8 56.1 26.8 82.9 0.0 82.9  *Hypopneas scored based on 4% or greater desaturation.  Sleep Stage:        REM NREM TST  AHI N/A 82.5 82.9  RDI N/A 82.5 82.9           Body Position Data:  Sleep (min) TST (%)  REM (min) NREM (min) CA (#) OA (#) MA (#) HYP (#) AHI (#/h) RERA (#) RDI (#/h) Desat (#)  Supine 144.9 48.38 0.0 144.9 5 186 7 38 97.7 0 97.7 256  Non-Supine 154.60 51.62 0.00 154.60 4.00 76.00 2.00 96.00 69.08 0 69.08 195.00  Left: 68.0 22.70 0.0 68.0 4 8 0 46 51.2 0 51.2 80  Right: 86.6 28.91 0.0 86.6 0 68 2 50 83.1 0 83.1 115     Snoring: Total number of snoring episodes  0  Total time with snoring    min (   % of sleep)   Oximetry Distribution:             WK REM NREM TOTAL  Average (%)   94    92 93  <  90% 7.4 0.0 45.7 53.1  < 80% 0.0 0.0 0.0 0.0  < 70% 0.0 0.0 0.0 0.0  # of Desaturations* 10 0 442 452  Desat Index (#/hour) 5.7    88.5 90.6  Desat Max (%) 9 0 15 15  Desat Max Dur (sec) 29.0 0.0 51.0 51.0  Approx Min O2 during sleep 68  Approx min O2 during a respiratory event 68  Was Oxygen added (Y/N) and final rate :    LPM  *Desaturations based on 3% or greater drop from baseline.   Cheyne Stokes Breathing: None Present   Heart Rate Summary:  Average Heart Rate During Sleep 64.0 bpm      Highest Heart Rate During Sleep (95th %) 70.0 bpm      Highest Heart Rate During Sleep 176 bpm (artifact)  Highest Heart Rate During Recording (TIB) 208 bpm (artifact)   Heart Rate Observations: Event Type # Events   Bradycardia 0 Lowest HR Scored: N/A  Sinus Tachycardia During Sleep 0 Highest HR Scored: N/A  Narrow Complex Tachycardia 0 Highest HR Scored: N/A  Wide Complex Tachycardia 0 Highest HR Scored: N/A  Asystole 0 Longest Pause: N/A  Atrial Fibrillation 0 Duration Longest Event: N/A  Other Arrythmias   Type:    Periodic Limb Movement Data: (Primary legs unless otherwise noted) Total # Limb Movement 444 Limb Movement Index 88.9  Total # PLMS 419 PLMS Index 83.9  Total # PLMS Arousals 40 PLMS Arousal Index 8.0  Percentage Sleep Time with PLMS 223.18min (74.7 % sleep)  Mean Duration limb movements (secs) 706.2

## 2024-07-01 LAB — CMP14+EGFR
ALT: 9 IU/L (ref 0–32)
AST: 14 IU/L (ref 0–40)
Albumin: 3.9 g/dL (ref 3.9–4.9)
Alkaline Phosphatase: 78 IU/L (ref 49–135)
BUN/Creatinine Ratio: 19 (ref 12–28)
BUN: 13 mg/dL (ref 8–27)
Bilirubin Total: 0.4 mg/dL (ref 0.0–1.2)
CO2: 25 mmol/L (ref 20–29)
Calcium: 9.6 mg/dL (ref 8.7–10.3)
Chloride: 103 mmol/L (ref 96–106)
Creatinine, Ser: 0.67 mg/dL (ref 0.57–1.00)
Globulin, Total: 2.7 g/dL (ref 1.5–4.5)
Glucose: 87 mg/dL (ref 70–99)
Potassium: 3.7 mmol/L (ref 3.5–5.2)
Sodium: 142 mmol/L (ref 134–144)
Total Protein: 6.6 g/dL (ref 6.0–8.5)
eGFR: 95 mL/min/1.73 (ref >59)

## 2024-07-01 LAB — CBC WITH DIFFERENTIAL/PLATELET
Basophils Absolute: 0 x10E3/uL (ref 0.0–0.2)
Basos: 1 %
EOS (ABSOLUTE): 0.2 x10E3/uL (ref 0.0–0.4)
Eos: 3 %
Hematocrit: 43.6 % (ref 34.0–46.6)
Hemoglobin: 13.7 g/dL (ref 11.1–15.9)
Immature Grans (Abs): 0 x10E3/uL (ref 0.0–0.1)
Immature Granulocytes: 0 %
Lymphocytes Absolute: 2 x10E3/uL (ref 0.7–3.1)
Lymphs: 26 %
MCH: 26.9 pg (ref 26.6–33.0)
MCHC: 31.4 g/dL — ABNORMAL LOW (ref 31.5–35.7)
MCV: 86 fL (ref 79–97)
Monocytes Absolute: 0.8 x10E3/uL (ref 0.1–0.9)
Monocytes: 11 %
Neutrophils Absolute: 4.6 x10E3/uL (ref 1.4–7.0)
Neutrophils: 59 %
Platelets: 162 x10E3/uL (ref 150–450)
RBC: 5.09 x10E6/uL (ref 3.77–5.28)
RDW: 14.4 % (ref 11.7–15.4)
WBC: 7.7 x10E3/uL (ref 3.4–10.8)

## 2024-07-01 LAB — ANA,IFA RA DIAG PNL W/RFLX TIT/PATN
ANA Titer 1: POSITIVE — AB
Nucleolar Pattern: 84 U — AB (ref 0–19)
Rheumatoid fact SerPl-aCnc: 280.6 [IU]/mL — AB (ref <14.0)

## 2024-07-01 LAB — B12 AND FOLATE PANEL
Folate: 10.2 ng/mL (ref >3.0)
Vitamin B-12: 779 pg/mL (ref 232–1245)

## 2024-07-01 LAB — LIPID PANEL
Chol/HDL Ratio: 3.3 ratio (ref 0.0–4.4)
Cholesterol, Total: 181 mg/dL (ref 100–199)
HDL: 55 mg/dL (ref >39)
LDL Chol Calc (NIH): 112 mg/dL — ABNORMAL HIGH (ref 0–99)
Triglycerides: 74 mg/dL (ref 0–149)
VLDL Cholesterol Cal: 14 mg/dL (ref 5–40)

## 2024-07-01 LAB — C-REACTIVE PROTEIN: CRP: 37 mg/L — ABNORMAL HIGH (ref 0–10)

## 2024-07-01 LAB — FANA STAINING PATTERNS: Speckled Pattern: 1:80 {titer}

## 2024-07-01 LAB — SEDIMENTATION RATE: Sed Rate: 62 mm/h — ABNORMAL HIGH (ref 0–40)

## 2024-07-01 LAB — VITAMIN D 25 HYDROXY (VIT D DEFICIENCY, FRACTURES): Vit D, 25-Hydroxy: 27.7 ng/mL — ABNORMAL LOW (ref 30.0–100.0)

## 2024-07-06 ENCOUNTER — Encounter (INDEPENDENT_AMBULATORY_CARE_PROVIDER_SITE_OTHER): Payer: Self-pay | Admitting: Internal Medicine

## 2024-07-06 ENCOUNTER — Encounter: Payer: Self-pay | Admitting: Nurse Practitioner

## 2024-07-06 ENCOUNTER — Ambulatory Visit (INDEPENDENT_AMBULATORY_CARE_PROVIDER_SITE_OTHER): Admitting: Nurse Practitioner

## 2024-07-06 VITALS — BP 138/86 | HR 69 | Temp 98.0°F | Resp 16 | Ht 67.0 in | Wt 245.6 lb

## 2024-07-06 DIAGNOSIS — R7689 Other specified abnormal immunological findings in serum: Secondary | ICD-10-CM

## 2024-07-06 DIAGNOSIS — R700 Elevated erythrocyte sedimentation rate: Secondary | ICD-10-CM

## 2024-07-06 DIAGNOSIS — R7982 Elevated C-reactive protein (CRP): Secondary | ICD-10-CM

## 2024-07-06 DIAGNOSIS — G4733 Obstructive sleep apnea (adult) (pediatric): Secondary | ICD-10-CM

## 2024-07-06 DIAGNOSIS — E1169 Type 2 diabetes mellitus with other specified complication: Secondary | ICD-10-CM

## 2024-07-06 DIAGNOSIS — E559 Vitamin D deficiency, unspecified: Secondary | ICD-10-CM

## 2024-07-06 DIAGNOSIS — R7681 Abnormal rheumatoid factor and anti-citrullinated protein antibody without rheumatoid arthritis: Secondary | ICD-10-CM

## 2024-07-06 DIAGNOSIS — E785 Hyperlipidemia, unspecified: Secondary | ICD-10-CM

## 2024-07-06 DIAGNOSIS — M064 Inflammatory polyarthropathy: Secondary | ICD-10-CM

## 2024-07-06 DIAGNOSIS — M2550 Pain in unspecified joint: Secondary | ICD-10-CM

## 2024-07-06 NOTE — Progress Notes (Unsigned)
 [facility] [address] [place], [place] [address]  Internal MEDICINE  Office Visit Note  Patient Name: Jill Banks  [id number]  [id number]  Date of Service: 07/07/2024  Chief Complaint  Patient presents with   Follow-up    Review labs    HPI Jill Banks presents for a follow-up visit for lab results.  Rheumatoid factor positive -- 280.6 Elevated CCP antibodies --84 which is a strong positive  Elevated sed rate -- 62 Elevated CRP -- 37 ANA positive, speckled pattern 1:80 These labs were done due to the patient having a lot of issues with arthritis in hands and neck.  She also has a family history of lupus and RA.  Slightly low vitamin D  level Cholesterol panel is normal except for slightly elevated LDL at 112.    Fibrosis 4 Score = 1.96 Fib-4 interpretation is not validated for people under 35 or over 88 years of age. However, scores under 2.0 are generally considered low risk.   Current Medication: Outpatient Encounter Medications as of 07/06/2024  Medication Sig   Accu-Chek Softclix Lancets lancets Use as instructed to check blood sugars once daily   bisoprolol  (ZEBETA ) 10 MG tablet Take 1 tablet (10 mg total) by mouth daily.   Blood Glucose Monitoring Suppl (ONETOUCH VERIO FLEX SYSTEM) w/Device KIT    calcitRIOL  (ROCALTROL ) 0.25 MCG capsule TAKE (1) CAPSULE BY MOUTH ONCE DAILY.   cyanocobalamin  (VITAMIN B12) 1000 MCG tablet Take 1 tablet (1,000 mcg total) by mouth every morning.   diclofenac sodium (VOLTAREN) 1 % GEL Apply 2 g topically 4 (four) times daily as needed (knee pain).   glucose blood test strip Use as instructed to check blood sugars once daily E11.65   ibuprofen  (ADVIL ) 800 MG tablet Take 800 mg by mouth every 8 (eight) hours as needed for mild pain (pain score 1-3).   potassium chloride  SA (KLOR-CON  M) 20 MEQ tablet Take 1 tablet (20 mEq total) by mouth 2 (two) times daily.   valsartan -hydrochlorothiazide  (DIOVAN -HCT) 80-12.5 MG tablet Take 1  tablet by mouth daily.   No facility-administered encounter medications on file as of 07/06/2024.    Surgical History: Past Surgical History:  Procedure Laterality Date   APPLICATION OF WOUND VAC 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   Procedure: APPLICATION OF WOUND VAC;  Surgeon: Kathlynn Sharper, MD;  Location: [facility] ORS;  Service: Orthopedics;  Laterality: Left;  #[id number]   COLONOSCOPY WITH PROPOFOL  N/A 09/25/2021   Procedure: COLONOSCOPY WITH PROPOFOL ;  Surgeon: Unk Corinn Skiff, MD;  Location: [facility] ENDOSCOPY;  Service: Gastroenterology;  Laterality: N/A;   DILATATION & CURETTAGE/HYSTEROSCOPY WITH MYOSURE  10/17/2020   Procedure: DILATATION & CURETTAGE/HYSTEROSCOPY WITH MYOSURE POLYPECTOMY;  Surgeon: Victor Claudell JONELLE, MD;  Location: [facility] ORS;  Service: Gynecology;;   DILATION AND CURETTAGE OF UTERUS     x 3   TOE SURGERY Left    second toe   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   Procedure: LEFT TOTAL KNEE ARTHROPLASTY;  Surgeon: Kathlynn Sharper, MD;  Location: [facility] ORS;  Service: Orthopedics;  Laterality: Left;   TOTAL KNEE ARTHROPLASTY Right 02/14/2023   Procedure: RIGHT TOTAL KNEE ARTHROPLASTY;  Surgeon: Vernetta Lonni GRADE, MD;  Location: [facility] ORS;  Service: Orthopedics;  Laterality: Right;   XI ROBOTIC LAPAROSCOPIC ASSISTED APPENDECTOMY N/A 10/01/2023   Procedure: XI ROBOTIC LAPAROSCOPIC ASSISTED APPENDECTOMY;  Surgeon: Kallie Manuelita BROCKS, MD;  Location: [facility]S;  Service: General;  Laterality: N/A;    Medical History: Past Medical History:  Diagnosis Date   Anxiety  Arthritis    Blood dyscrasia    Thrombocytopenia   Bruises easily    COVID-19 04/2019   Diabetes mellitus without complication (HCC)    Headache    HLD (hyperlipidemia)    Hypertension    Leg pain    Pneumonia    about 5 yrs ago   Sleep apnea    no CPAP    Family History: Family History  Problem Relation Age of Onset   Hypertension Father    Stroke Father    Hypertension Brother    Stroke Brother     Hyperlipidemia Brother    Breast cancer Maternal Aunt    Asthma Son     Social History   Socioeconomic History   Marital status: Married    Spouse name: Not on file   Number of children: Not on file   Years of education: Not on file   Highest education level: Not on file  Occupational History   Not on file  Tobacco Use   Smoking status: Former    Current packs/day: 0.00    Average packs/day: 0.3 packs/day for 2.0 years (0.5 ttl pk-yrs)    Types: Cigarettes    Start date: 2007    Quit date: 2009    Years since quitting: 16.8    Passive exposure: Never   Smokeless tobacco: Never  Vaping Use   Vaping status: Never Used  Substance and Sexual Activity   Alcohol use: Yes    Comment: ocassionally   Drug use: No   Sexual activity: Yes  Other Topics Concern   Not on file  Social History Narrative   Not on file   Social Drivers of Health   Financial Resource Strain: Not on file  Food Insecurity: No Food Insecurity (10/02/2023)   Hunger Vital Sign    Worried About Running Out of Food in the Last Year: Never true    Ran Out of Food in the Last Year: Never true  Transportation Needs: No Transportation Needs (10/02/2023)   PRAPARE - Administrator, Civil Service (Medical): No    Lack of Transportation (Non-Medical): No  Physical Activity: Not on file  Stress: Not on file  Social Connections: Unknown (10/02/2023)   Social Connection and Isolation Panel    Frequency of Communication with Friends and Family: More than three times a week    Frequency of Social Gatherings with Friends and Family: More than three times a week    Attends Religious Services: Not on file    Active Member of Clubs or Organizations: Yes    Attends Banker Meetings: More than 4 times per year    Marital Status: Married  Catering manager Violence: Not At Risk (10/02/2023)   Humiliation, Afraid, Rape, and Kick questionnaire    Fear of Current or Ex-Partner: No    Emotionally  Abused: No    Physically Abused: No    Sexually Abused: No      Review of Systems  Constitutional:  Positive for fatigue and unexpected weight change. Negative for chills.  HENT:  Negative for congestion, postnasal drip, rhinorrhea, sneezing and sore throat.   Eyes:  Negative for redness.  Respiratory: Negative.  Negative for cough, chest tightness, shortness of breath and wheezing.   Cardiovascular: Negative.  Negative for chest pain and palpitations.  Gastrointestinal:  Negative for abdominal pain, constipation, diarrhea, nausea and vomiting.  Genitourinary:  Negative for dysuria and frequency.  Musculoskeletal:  Positive for arthralgias, joint swelling, myalgias and  neck pain. Negative for back pain.  Skin:  Negative for rash.  Neurological: Negative.  Negative for tremors and numbness.  Hematological:  Negative for adenopathy. Does not bruise/bleed easily.  Psychiatric/Behavioral:  Negative for behavioral problems (Depression), self-injury, sleep disturbance and suicidal ideas. The patient is not nervous/anxious.     Vital Signs: BP 138/86   Pulse 69   Temp 98 F (36.7 C)   Resp 16   Ht 5' 7 (1.702 m)   Wt 245 lb 9.6 oz (111.4 kg)   SpO2 93%   BMI 38.47 kg/m    Physical Exam Vitals reviewed.  Constitutional:      General: She is not in acute distress.    Appearance: Normal appearance. She is obese. She is not ill-appearing.  HENT:     Head: Normocephalic and atraumatic.  Eyes:     Pupils: Pupils are equal, round, and reactive to light.  Neck:     Trachea: Trachea and phonation normal.  Cardiovascular:     Rate and Rhythm: Normal rate and regular rhythm.  Pulmonary:     Effort: Pulmonary effort is normal. No respiratory distress.  Musculoskeletal:     Right hand: Swelling and tenderness present. Decreased range of motion. Decreased strength.     Left hand: Swelling and tenderness present. Decreased range of motion. Decreased strength.     Cervical back: Pain  with movement and muscular tenderness present. Decreased range of motion.     Right lower leg: No edema.     Left lower leg: No edema.  Skin:    General: Skin is warm and dry.     Capillary Refill: Capillary refill takes less than 2 seconds.  Neurological:     Mental Status: She is alert.  Psychiatric:        Mood and Affect: Mood normal.        Assessment/Plan: 1. Inflammatory polyarthritis (HCC) (Primary) Urgent referral to rheumatology  - Ambulatory referral to Rheumatology  2. Elevated rheumatoid factor Urgent referral to rheumatology  - Ambulatory referral to Rheumatology  3. 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 Elevated sed rate Urgent referral to rheumatology  - Ambulatory referral to Rheumatology  5. Elevated C-reactive protein (CRP) Urgent referral to rheumatology  - Ambulatory referral to Rheumatology  6. Type 2 diabetes mellitus with other specified complication, without long-term current use of insulin  (HCC) Continue diabetic diet and physical activity as tolerated.   7. Vitamin D  deficiency Continue OTC vitamin D  supplement.   8. Hyperlipidemia associated with type 2 diabetes mellitus (HCC) Not currently on statin therapy, levels are stable at this time.    General Counseling: Jill Banks understanding of the findings of todays visit and agrees with plan of treatment. I have discussed any further diagnostic evaluation that may be needed or ordered today. We also reviewed her medications today. she has been encouraged to call the office with any questions or concerns that should arise related to todays visit.    Orders Placed This Encounter  Procedures   Ambulatory referral to Rheumatology    No orders of the defined types were placed in this encounter.   Return for previously scheduled, AWV, Rajanae Mantia PCP soon and urgent rheumatology  referral.   Total time spent:30 Minutes Time spent includes review of chart, medications, test results, and follow up plan with the patient.   [place] Controlled Substance Database was reviewed by me.  This patient  was seen by Mardy Maxin, FNP-C in collaboration with Dr. Sigrid Bathe as a part of collaborative care agreement.   Jill Matthes R. Maxin, MSN, FNP-C Internal medicine

## 2024-07-07 ENCOUNTER — Telehealth: Payer: Self-pay

## 2024-07-07 ENCOUNTER — Telehealth: Payer: Self-pay | Admitting: Nurse Practitioner

## 2024-07-07 ENCOUNTER — Encounter: Payer: Self-pay | Admitting: Nurse Practitioner

## 2024-07-07 DIAGNOSIS — R7689 Other specified abnormal immunological findings in serum: Secondary | ICD-10-CM | POA: Insufficient documentation

## 2024-07-07 DIAGNOSIS — E1169 Type 2 diabetes mellitus with other specified complication: Secondary | ICD-10-CM | POA: Insufficient documentation

## 2024-07-07 DIAGNOSIS — M2550 Pain in unspecified joint: Secondary | ICD-10-CM | POA: Insufficient documentation

## 2024-07-07 DIAGNOSIS — R7982 Elevated C-reactive protein (CRP): Secondary | ICD-10-CM | POA: Insufficient documentation

## 2024-07-07 DIAGNOSIS — R700 Elevated erythrocyte sedimentation rate: Secondary | ICD-10-CM | POA: Insufficient documentation

## 2024-07-07 NOTE — Telephone Encounter (Signed)
 Rheumatology referral faxed to [facility]; [phone] Notified patient. Gave pt telephone [phone]

## 2024-07-07 NOTE — Addendum Note (Signed)
 Addended by: Shanise Balch on: 07/07/2024 12:33 PM   Modules accepted: Orders

## 2024-07-07 NOTE — Telephone Encounter (Signed)
 Done

## 2024-07-16 ENCOUNTER — Telehealth: Payer: Self-pay | Admitting: Nurse Practitioner

## 2024-07-16 NOTE — Telephone Encounter (Signed)
 Patient called stating [facility] cannot get her in until December. She asked that I now send referral to Dr. Lady Blanch w/ [facility]. Referral sent via [employer]

## 2024-07-16 NOTE — Procedures (Signed)
 [facility]  Polysomnogram Report Part I  Phone: [phone] Fax: [phone]  Patient Name: Eller, Sweis. Acquisition Number: [id number]  Date of Birth: Mar 16, 1956 Acquisition Date: 07/06/2024  Referring Physician: Elfreda RONAL Bathe, MD     History: The patient is a 68 year old  . Medical History: anxiety, arthritis, blood dyscarasia, diabetes mellitus w/o complication, headaches, HLD, hypertension, OSA.  Medications: potassium chloride , vitamin B12, calcitriol , bisoprolol , advil , valsartan -hydrochlorothiazide  .  Procedure: This routine overnight polysomnogram was performed on the Alice 5 using the standard CPAP protocol. This included 6 channels of EEG, 2 channels of EOG, chin EMG, bilateral anterior tibialis EMG, nasal/oral thermistor, PTAF (nasal pressure transducer), chest and abdominal wall movements, EKG, and pulse oximetry.  Description: The total recording time was 405.0 minutes. The total sleep time was 276.5 minutes. There were a total of 80.0 minutes of wakefulness after sleep onset for a reducedsleep efficiency of 68.3%. The latency to sleep onset wasprolonged at 48.5 minutes. The R sleep onset latency was within normal limits at 104.5 minutes. Sleep parameters, as a percentage of the total sleep time, demonstrated 20.1% of sleep was in N1 sleep, 42.3% N2, 12.1% N3 and 25.5% R sleep. There were a total of 264 arousals for an arousal index of 57.3 arousals per hour of sleep that waselevated.  Overall, there were a total of 121 respiratory events for a respiratory disturbance index, which includes apneas, hypopneas and RERAs (increased respiratory effort) of 26.3 respiratory events per hour of sleep during the pressure titration.  was initiated at 4 cm H2O at lights out, 10:28 p.m. It was titrated in 1-2 cm increments for frequent obstructive events to 15 cmH2O. The apnea was controlled at this pressure and supine, REM sleep was observed. The pressure was further titrated to  the final pressure of 16 cm H2O.   Additionally, the baseline oxygen saturation during wakefulness was 97%, during NREM sleep averaged 97%, and during REM sleep averaged 97%. The total duration of oxygen < 90% was 2.1 minutes.  Cardiac monitoring-  significant cardiac rhythm irregularities.   Periodic limb movement monitoring- demonstrated that there were 32 periodic limb movements for a periodic limb movement index of 6.9 periodic limb movements per hour of sleep.     Impression: This patient's obstructive sleep apnea demonstrated significant improvement with the utilization of nasal  at 15 cm H2O.   There were few periodic limb movements that commonly are not significant. Clinical correlation would be suggested.   Recommendations: Would recommend utilization of nasal  at 15 cm H2O. Set up should be as soon as possible due to the severity of the sleep apnea. An AirFit F20 Large mask was used. Chin strap used during study- no. Humidifier used during study- heated.     Elfreda RONAL Bathe, MD, [facility] Diplomate ABMS-Pulmonary, Critical Care and Sleep Medicine  Electronically reviewed and digitally signed  [facility] CPAP/BIPAP Polysomnogram Report Part II Phone: [phone] Fax: [phone]  Patient last name Mcswain Neck Size 16.0 in. Acquisition [phone]  Patient first name Kalecia Hartney. Weight 249.0 lbs. Started 07/06/2024 at 10:21:58 PM  Birth date [birth year] Height 67.0 in. Stopped 07/07/2024 at 5:18:22 AM  Age 68      Type Adult BMI 39.0 lb./in2 Duration 405.0  Selinda Capron, RPSGT & Harlene Kemp  Reviewed by: Marval MATSU. Henke, PhD, ABSM, FAASM Sleep Data: Lights Out: 10:28:58 PM Sleep Onset: 11:17:28 PM  Lights On: 5:13:58 AM Sleep Efficiency: 68.3 %  Total Recording  Time: 405.0 min Sleep Latency (from Lights Off) 48.5 min  Total Sleep Time (TST): 276.5 min R Latency (from Sleep Onset): 104.5 min  Sleep Period Time: 355.0 min Total number of awakenings: 19  Wake during  sleep: 78.5 min Wake After Sleep Onset (WASO): 80.0 min   Sleep Data:         Arousal Summary: Stage  Latency from lights out (min) Latency from sleep onset (min) Duration (min) % Total Sleep Time  Normal values  N 1 48.5 0.0 55.5 20.1 (5%)  N 2 51.5 3.0 117.0 42.3 (50%)  N 3 120.5 72.0 33.5 12.1 (20%)  R 153.0 104.5 70.5 25.5 (25%)   Number Index  Spontaneous 105 22.8  Apneas & Hypopneas 116 25.2  RERAs 5 1.1       (Apneas & Hypopneas & RERAs)  (121) (26.3)  Limb Movement 33 7.2  Snore 0 0.0  TOTAL 259 56.2     Respiratory Data:  CA OA MA Apnea Hypopnea* A+ H RERA Total  Number 28 69 1 98 23 121 5 126  Mean Dur (sec) 12.7 12.9 14.5 12.9 15.7 13.4 11.4 13.3  Max Dur (sec) 46.0 28.5 14.5 46.0 27.0 46.0 12.5 46.0  Total Dur (min) 5.9 14.8 0.2 21.0 6.0 27.0 0.9 27.9  % of TST 2.1 5.4 0.1 7.6 2.2 9.8 0.3 10.1  Index (#/h TST) 6.1 15.0 0.2 21.3 5.0 26.3 1.1 27.3  *Hypopneas scored based on 4% or greater desaturation.  Sleep Stage:         REM NREM TST  AHI 10.2 31.7 26.3  RDI 10.2 33.2 27.3   Sleep (min) TST (%) REM (min) NREM (min) CA (#) OA (#) MA (#) HYP (#) AHI (#/h) RERA (#) RDI (#/h) Desat (#)  Supine 175.5 63.47 39.5 136.0 28 69 1 23 41.4 5 43.1 125  Non-Supine 101.00 36.53 31.00 70.00 0.00 0.00 0.00 0.00 0.00 0 0.00 0.00  Left: 101.0 36.53 31.0 70.0 0 0 0 0 0.0 0 0.00 0     Snoring: Total number of snoring episodes  0  Total time with snoring    min (   % of sleep)   Oximetry Distribution:             WK REM NREM TOTAL  Average (%)   97 97 97 97  < 90% 0.5 1.6 0.0 2.1  < 80% 0.1 0.0 0.0 0.1  < 70% 0.0 0.0 0.0 0.0  # of Desaturations* 1 18 81 100  Desat Index (#/hour) 0.5 15.3 23.6 21.7  Desat Max (%) 3 13 11 13   Desat Max Dur (sec) 9.0 40.0 42.0 42.0  Approx Min O2 during sleep 82  Approx min O2 during a respiratory event 82  Was Oxygen added (Y/N) and final rate :    LPM  *Desaturations based on 3% or greater drop from baseline.   Cheyne  Stokes Breathing: None Present   Heart Rate Summary:  Average Heart Rate During Sleep 66.7 bpm      Highest Heart Rate During Sleep (95th %) 75.0 bpm      Highest Heart Rate During Sleep 150 bpm (artifact)  Highest Heart Rate During Recording (TIB) 200 bpm (artifact)   Heart Rate Observations: Event Type # Events   Bradycardia 0 Lowest HR Scored: N/A  Sinus Tachycardia During Sleep 0 Highest HR Scored: N/A  Narrow Complex Tachycardia 0 Highest HR Scored: N/A  Wide Complex Tachycardia 0 Highest HR Scored: N/A  Asystole 0 Longest Pause: N/A  Atrial Fibrillation 0 Duration Longest Event: N/A  Other Arrythmias   Type:   Periodic Limb Movement Data: (Primary legs unless otherwise noted) Total # Limb Movement 55 Limb Movement Index 11.9  Total # PLMS 33 PLMS Index 7.2  Total # PLMS Arousals 17 PLMS Arousal Index 3.7  Percentage Sleep Time with PLMS 12.54min (4.7 % sleep)  Mean Duration limb movements (secs) 155.0    IPAP Level (cmH2O) EPAP Level (cmH2O) Total Duration (min) Sleep Duration (min) Sleep (%) REM (%) CA  #) OA # MA # HYP #) AHI (#/hr) RERAs # RERAs (#/hr) RDI (#/hr)  4 4 24.8 21.3 85.9 0.0 16 17 1 6  112.7 5 14.1 126.8  6 6  16.0 16.0 100.0 0.0 8 25 0 0 123.8 0 0.0 123.8  8 8  9.8 8.8 89.8 0.0 2 18 0 0 136.4 0 0.0 136.4  10 10  34.7 34.7 100.0 0.0 0 0 0 14 24.2 0 0.0 24.2  12 12  26.7 26.7 100.0 33.3 2 6  0 1 20.2 0 0.0 20.2  14 14  19.0 19.0 100.0 100.0 0 1 0 1 6.3 0 0.0 6.3  15 15  69.0 46.9 68.0 15.8 0 0 0 0 0.0 0 0.0 0.0  16 16 143.2 101.0 70.5 21.6 0 0 0 0 0.0 0 0.0 0.0  18 16 2.1 0.0 0.0 0.0 0 0 0 0 0.0 0 0.0 0.0

## 2024-07-19 ENCOUNTER — Encounter: Payer: Self-pay | Admitting: Radiology

## 2024-07-24 ENCOUNTER — Encounter: Payer: Self-pay | Admitting: Nurse Practitioner

## 2024-08-02 ENCOUNTER — Ambulatory Visit: Admitting: Internal Medicine

## 2024-08-02 ENCOUNTER — Encounter: Payer: Self-pay | Admitting: Internal Medicine

## 2024-08-02 VITALS — BP 140/80 | HR 72 | Temp 98.0°F | Resp 16 | Ht 67.0 in | Wt 243.0 lb

## 2024-08-02 DIAGNOSIS — G4733 Obstructive sleep apnea (adult) (pediatric): Secondary | ICD-10-CM | POA: Diagnosis not present

## 2024-08-02 DIAGNOSIS — R7689 Other specified abnormal immunological findings in serum: Secondary | ICD-10-CM

## 2024-08-02 NOTE — Patient Instructions (Signed)

## 2024-08-02 NOTE — Progress Notes (Signed)
 [facility] [address] [place], [place] [address]  Pulmonary Sleep Medicine   Office Visit Note  Patient Name: Jill Banks DOB: [birth year] MRN [id number]  Date of Service: 08/02/2024  Complaints/HPI: She had a PSG shows very severe OSA. She tehn went for a CPAP titration and this shows a pressure requirement of 15cwp. At this pressure she had an index of zero. She has not been using the CPAP. She notices headaches. She states she had difficulty using the but she is willing to give it another shot. Will need to also work on weight.  Office Spirometry Results:     ROS  General: (-) fever, (-) chills, (-) night sweats, (-) weakness Skin: (-) rashes, (-) itching,. Eyes: (-) visual changes, (-) redness, (-) itching. Nose and Sinuses: (-) nasal stuffiness or itchiness, (-) postnasal drip, (-) nosebleeds, (-) sinus trouble. Mouth and Throat: (-) sore throat, (-) hoarseness. Neck: (-) swollen glands, (-) enlarged thyroid, (-) neck pain. Respiratory: - cough, (-) bloody sputum, - shortness of breath, - wheezing. Cardiovascular: - ankle swelling, (-) chest pain. Lymphatic: (-) lymph node enlargement. Neurologic: (-) numbness, (-) tingling. Psychiatric: (-) anxiety, (-) depression   Current Medication: Outpatient Encounter Medications as of 08/02/2024  Medication Sig   bisoprolol  (ZEBETA ) 10 MG tablet Take 1 tablet (10 mg total) by mouth daily.   calcitRIOL  (ROCALTROL ) 0.25 MCG capsule TAKE (1) CAPSULE BY MOUTH ONCE DAILY.   cyanocobalamin  (VITAMIN B12) 1000 MCG tablet Take 1 tablet (1,000 mcg total) by mouth every morning.   diclofenac sodium (VOLTAREN) 1 % GEL Apply 2 g topically 4 (four) times daily as needed (knee pain).   potassium chloride  SA (KLOR-CON  M) 20 MEQ tablet Take 1 tablet (20 mEq total) by mouth 2 (two) times daily.   valsartan -hydrochlorothiazide  (DIOVAN -HCT) 80-12.5 MG tablet Take 1 tablet by mouth daily.   Accu-Chek Softclix Lancets lancets Use  as instructed to check blood sugars once daily   Blood Glucose Monitoring Suppl (ONETOUCH VERIO FLEX SYSTEM) w/Device KIT    glucose blood test strip Use as instructed to check blood sugars once daily E11.65   ibuprofen  (ADVIL ) 800 MG tablet Take 800 mg by mouth every 8 (eight) hours as needed for mild pain (pain score 1-3).   No facility-administered encounter medications on file as of 08/02/2024.    Surgical History: Past Surgical History:  Procedure Laterality Date   APPLICATION OF WOUND VAC 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   Procedure: APPLICATION OF WOUND VAC;  Surgeon: Kathlynn Sharper, MD;  Location: [facility] ORS;  Service: Orthopedics;  Laterality: Left;  #[id number]   COLONOSCOPY WITH PROPOFOL  N/A 09/25/2021   Procedure: COLONOSCOPY WITH PROPOFOL ;  Surgeon: Unk Corinn Skiff, MD;  Location: [facility] ENDOSCOPY;  Service: Gastroenterology;  Laterality: N/A;   DILATATION & CURETTAGE/HYSTEROSCOPY WITH MYOSURE  10/17/2020   Procedure: DILATATION & CURETTAGE/HYSTEROSCOPY WITH MYOSURE POLYPECTOMY;  Surgeon: Victor Claudell JONELLE, MD;  Location: [facility] ORS;  Service: Gynecology;;   DILATION AND CURETTAGE OF UTERUS     x 3   TOE SURGERY Left    second toe   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   Procedure: LEFT TOTAL KNEE ARTHROPLASTY;  Surgeon: Kathlynn Sharper, MD;  Location: [facility] ORS;  Service: Orthopedics;  Laterality: Left;   TOTAL KNEE ARTHROPLASTY Right 02/14/2023   Procedure: RIGHT TOTAL KNEE ARTHROPLASTY;  Surgeon: Vernetta Lonni GRADE, MD;  Location: [facility] ORS;  Service: Orthopedics;  Laterality: Right;   XI ROBOTIC LAPAROSCOPIC ASSISTED APPENDECTOMY N/A 10/01/2023   Procedure: XI ROBOTIC LAPAROSCOPIC ASSISTED  APPENDECTOMY;  Surgeon: Kallie Manuelita BROCKS, MD;  Location: [facility]S;  Service: General;  Laterality: N/A;    Medical History: Past Medical History:  Diagnosis Date   Anxiety    Arthritis    Blood dyscrasia    Thrombocytopenia   Bruises easily    COVID-19 04/2019   Diabetes mellitus without  complication (HCC)    Headache    HLD (hyperlipidemia)    Hypertension    Leg pain    Pneumonia    about 5 yrs ago   Sleep apnea    no CPAP    Family History: Family History  Problem Relation Age of Onset   Hypertension Father    Stroke Father    Hypertension Brother    Stroke Brother    Hyperlipidemia Brother    Breast cancer Maternal Aunt    Asthma Son     Social History: Social History   Socioeconomic History   Marital status: Married    Spouse name: Not on file   Number of children: Not on file   Years of education: Not on file   Highest education level: Not on file  Occupational History   Not on file  Tobacco Use   Smoking status: Former    Current packs/day: 0.00    Average packs/day: 0.3 packs/day for 2.0 years (0.5 ttl pk-yrs)    Types: Cigarettes    Start date: 2007    Quit date: 2009    Years since quitting: 16.8    Passive exposure: Never   Smokeless tobacco: Never  Vaping Use   Vaping status: Never Used  Substance and Sexual Activity   Alcohol use: Yes    Comment: ocassionally   Drug use: No   Sexual activity: Yes  Other Topics Concern   Not on file  Social History Narrative   Not on file   Social Drivers of Health   Financial Resource Strain: Not on file  Food Insecurity: No Food Insecurity (10/02/2023)   Hunger Vital Sign    Worried About Running Out of Food in the Last Year: Never true    Ran Out of Food in the Last Year: Never true  Transportation Needs: No Transportation Needs (10/02/2023)   PRAPARE - Administrator, Civil Service (Medical): No    Lack of Transportation (Non-Medical): No  Physical Activity: Not on file  Stress: Not on file  Social Connections: Unknown (10/02/2023)   Social Connection and Isolation Panel    Frequency of Communication with Friends and Family: More than three times a week    Frequency of Social Gatherings with Friends and Family: More than three times a week    Attends Religious Services:  Not on Insurance Claims Handler of Clubs or Organizations: Yes    Attends Banker Meetings: More than 4 times per year    Marital Status: Married  Catering Manager Violence: Not At Risk (10/02/2023)   Humiliation, Afraid, Rape, and Kick questionnaire    Fear of Current or Ex-Partner: No    Emotionally Abused: No    Physically Abused: No    Sexually Abused: No    Vital Signs: Blood pressure (!) 140/80, pulse 72, temperature 98 F (36.7 C), resp. rate 16, height 5' 7 (1.702 m), weight 243 lb (110.2 kg), SpO2 100%.  Examination: General Appearance: The patient is well-developed, well-nourished, and in no distress. Skin: Gross inspection of skin unremarkable. Head: normocephalic, no gross deformities. Eyes: no gross deformities noted.  ENT: ears appear grossly normal no exudates. Neck: Supple. No thyromegaly. No LAD. Respiratory: no rhonchi noted. Cardiovascular: Normal S1 and S2 without murmur or rub. Extremities: No cyanosis. pulses are equal. Neurologic: Alert and oriented. No involuntary movements.  LABS: Recent Results (from the past 2160 hours)  Basic Metabolic Panel (BMET)     Status: None   Collection Time: 05/11/24  1:30 PM  Result Value Ref Range   Glucose 87 70 - 99 mg/dL   BUN 12 8 - 27 mg/dL   Creatinine, Ser 9.28 0.57 - 1.00 mg/dL   eGFR 93 >40 fO/fpw/8.26   BUN/Creatinine Ratio 17 12 - 28   Sodium 140 134 - 144 mmol/L   Potassium 3.8 3.5 - 5.2 mmol/L   Chloride 100 96 - 106 mmol/L   CO2 25 20 - 29 mmol/L   Calcium  9.5 8.7 - 10.3 mg/dL  POCT glycosylated hemoglobin (Hb A1C)     Status: Abnormal   Collection Time: 06/22/24  2:05 PM  Result Value Ref Range   Hemoglobin A1C 5.9 (A) 4.0 - 5.6 %   HbA1c POC (<> result, manual entry)     HbA1c, POC (prediabetic range)     HbA1c, POC (controlled diabetic range)    Urine Microalbumin w/creat. ratio     Status: None   Collection Time: 06/22/24  4:11 PM  Result Value Ref Range   Creatinine, Urine 106.4  Not Estab. mg/dL   Microalbumin, Urine 76.4 Not Estab. ug/mL   Microalb/Creat Ratio 22 0 - 29 mg/g creat    Comment:                        Normal:                0 -  29                        Moderately increased: 30 - 300                        Severely increased:       >300   Sed Rate (ESR)     Status: Abnormal   Collection Time: 06/28/24 12:09 PM  Result Value Ref Range   Sed Rate 62 (H) 0 - 40 mm/hr  C-reactive protein     Status: Abnormal   Collection Time: 06/28/24 12:09 PM  Result Value Ref Range   CRP 37 (H) 0 - 10 mg/L  ANA,IFA RA Diag Pnl w/rflx Tit/Patn     Status: Abnormal   Collection Time: 06/28/24 12:09 PM  Result Value Ref Range   ANA Titer 1 Positive (A)     Comment:                                      Negative   <1:80                                      Borderline  1:80                                      Positive   >1:80    Rheumatoid fact SerPl-aCnc 280.6 (  H) <14.0 IU/mL    Comment: Results confirmed on dilution.    Cyclic Citrullin Peptide Ab 84 (H) 0 - 19 units    Comment:                           Negative               <20                           Weak positive      20 - 39                           Moderate positive  40 - 59                           Strong positive        >59   CBC with Differential/Platelet     Status: Abnormal   Collection Time: 06/28/24 12:09 PM  Result Value Ref Range   WBC 7.7 3.4 - 10.8 x10E3/uL   RBC 5.09 3.77 - 5.28 x10E6/uL   Hemoglobin 13.7 11.1 - 15.9 g/dL   Hematocrit 56.3 65.9 - 46.6 %   MCV 86 79 - 97 fL   MCH 26.9 26.6 - 33.0 pg   MCHC 31.4 (L) 31.5 - 35.7 g/dL   RDW 85.5 88.2 - 84.5 %   Platelets 162 150 - 450 x10E3/uL   Neutrophils 59 Not Estab. %   Lymphs 26 Not Estab. %   Monocytes 11 Not Estab. %   Eos 3 Not Estab. %   Basos 1 Not Estab. %   Neutrophils Absolute 4.6 1.4 - 7.0 x10E3/uL   Lymphocytes Absolute 2.0 0.7 - 3.1 x10E3/uL   Monocytes Absolute 0.8 0.1 - 0.9 x10E3/uL   EOS (ABSOLUTE) 0.2  0.0 - 0.4 x10E3/uL   Basophils Absolute 0.0 0.0 - 0.2 x10E3/uL   Immature Granulocytes 0 Not Estab. %   Immature Grans (Abs) 0.0 0.0 - 0.1 x10E3/uL  CMP14+EGFR     Status: None   Collection Time: 06/28/24 12:09 PM  Result Value Ref Range   Glucose 87 70 - 99 mg/dL   BUN 13 8 - 27 mg/dL   Creatinine, Ser 9.32 0.57 - 1.00 mg/dL   eGFR 95 >40 fO/fpw/8.26   BUN/Creatinine Ratio 19 12 - 28   Sodium 142 134 - 144 mmol/L   Potassium 3.7 3.5 - 5.2 mmol/L   Chloride 103 96 - 106 mmol/L   CO2 25 20 - 29 mmol/L   Calcium  9.6 8.7 - 10.3 mg/dL   Total Protein 6.6 6.0 - 8.5 g/dL   Albumin 3.9 3.9 - 4.9 g/dL   Globulin, Total 2.7 1.5 - 4.5 g/dL   Bilirubin Total 0.4 0.0 - 1.2 mg/dL   Alkaline Phosphatase 78 49 - 135 IU/L   AST 14 0 - 40 IU/L   ALT 9 0 - 32 IU/L  Lipid Profile     Status: Abnormal   Collection Time: 06/28/24 12:09 PM  Result Value Ref Range   Cholesterol, Total 181 100 - 199 mg/dL   Triglycerides 74 0 - 149 mg/dL   HDL 55 >60 mg/dL   VLDL Cholesterol Cal 14 5 - 40 mg/dL   LDL Chol Calc (NIH) 887 (H) 0 - 99 mg/dL   Chol/HDL Ratio 3.3 0.0 -  4.4 ratio    Comment:                                   T. Chol/HDL Ratio                                             Men  Women                               1/2 Avg.Risk  3.4    3.3                                   Avg.Risk  5.0    4.4                                2X Avg.Risk  9.6    7.1                                3X Avg.Risk 23.4   11.0   Vitamin D  (25 hydroxy)     Status: Abnormal   Collection Time: 06/28/24 12:09 PM  Result Value Ref Range   Vit D, 25-Hydroxy 27.7 (L) 30.0 - 100.0 ng/mL    Comment: Vitamin D  deficiency has been defined by the Institute of Medicine and an Endocrine Society practice guideline as a level of serum 25-OH vitamin D  less than 20 ng/mL (1,2). The Endocrine Society went on to further define vitamin D  insufficiency as a level between 21 and 29 ng/mL (2). 1. IOM (Institute of Medicine). 2010.  Dietary reference    intakes for calcium  and D. [place]: The    [employer]. 2. Holick MF, Binkley Nora, Bischoff-Ferrari HA, et al.    Evaluation, treatment, and prevention of vitamin D     deficiency: an Endocrine Society clinical practice    guideline. JCEM. 2011 Jul; 96(7):1911-30.   B12 and Folate Panel     Status: None   Collection Time: 06/28/24 12:09 PM  Result Value Ref Range   Vitamin B-12 779 232 - 1,245 pg/mL   Folate 10.2 >3.0 ng/mL    Comment: A serum folate concentration of less than 3.1 ng/mL is considered to represent clinical deficiency.   FANA Staining Patterns     Status: None   Collection Time: 06/28/24 12:09 PM  Result Value Ref Range   Speckled Pattern 1:80     Comment: ICAP nomenclature: AC-2,4,5,29   Note: Comment     Comment: Pattern              Potential Disease Association -------------  --------------------------------------------- Homogeneous    Systemic Lupus Erythematosus, Drug Induced                Systemic Lupus Erythematosus, Chronic                Autoimmune hepatitis, Juvenile Idiopathic                Arthritis -------------  --------------------------------------------- Speckled       Sjogren Syndrome, Systemic Lupus  Erythematosus, Subacute Cutaneous Lupus,                Neonatal Lupus, Congenital Heart Block,                Mixed Connective Tissue Disease,                Scleroderma-diffuse, Scleroderma-Autoimmune                Myositis Overlap Syndrome, Systemic Lupus                Erythematosus-Scleroderma-Autoimmune                Myositis Overlap Syndrome, Systemic                Autoimmune Rheumatic Disease,                Undifferentiated Connective Tissue Disease -------------  --------------------------------------------- Nucleolar      Systemic Silver Firs lerosis, Scleroderma-Autoimmune                Myositis Overlap Syndrome, Sjogren                Syndrome, Raynaud phenomenon, Pulmonary                 Arterial Hypertension, Systemic Autoimmune                Rheumatic Disease, Cancer -------------  --------------------------------------------- Centromere     Scleroderma-CREST, Limited Cutaneous SSc,                Raynaud's Phenomenon, Primary Biliary                Cholangitis -------------  --------------------------------------------- Nuclear Dot    Primary Biliary Cholangitis -------------  --------------------------------------------- Nuclear        Primary Biliary Cholangitis, Autoimmune Membrane       Hepatitis/Liver disease, Systemic Autoimmune                Rheumatic Disease, Autoimmune Cytopenias,                Linear Scleroderma, Antiphospholipid Syndrome -------------  ---------------------------------------------     Radiology: US  Pelvic Complete With Transvaginal Result Date: 01/18/2024 CLINICAL DATA:  Postmenopausal bleeding for 3 days. EXAM: TRANSABDOMINAL AND TRANSVAGINAL ULTRASOUND OF PELVIS TECHNIQUE: Both transabdominal and transvaginal ultrasound examinations of the pelvis were performed. Transabdominal technique was performed for global imaging of the pelvis including uterus, ovaries, adnexal regions, and pelvic cul-de-sac. It was necessary to proceed with endovaginal exam following the transabdominal exam to visualize the endometrium and right ovary. COMPARISON:  CT abdomen pelvis 10/01/2023 and pelvic ultrasound 08/01/2020 FINDINGS: Uterus Measurements: 13.9 x 9.3 x 12.5 cm = volume: 848 mL. Numerous uterine fibroids with the largest measuring up to 6.0 cm. Endometrium Thickness: 10 mm.  No focal abnormality visualized. Right ovary Not visualized. Left ovary Measurements: 3.5 x 2.6 x 2.8 cm = volume: 13.7 mL. Normal appearance/no adnexal mass. Other findings No abnormal free fluid. IMPRESSION: Limited examination due to enlarged fibroid uterus. Numerous uterine fibroids measuring up to 6.0 cm grossly similar to 08/01/2020. Postmenopausal bleeding may be related to  submucosal fibroids. Endometrial carcinoma is not excluded. Gynecology referral is recommended if not obtained. Electronically Signed   By: Norman Gatlin M.D.   On: 01/18/2024 23:44    No results found.  No results found.  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 07/07/2024   Elevated rheumatoid factor 07/07/2024   Acute perforated appendicitis 10/01/2023   NAFLD (nonalcoholic fatty liver disease) 88/78/7975   Diabetes mellitus (HCC) 08/07/2023   Abnormal weight gain 08/07/2023   Status post total right knee replacement 02/14/2023   Thrombocytopenia 10/15/2022   Endometrial polyp    Postmenopausal bleeding    Uterine enlargement 09/17/2020   BMI 40.0-44.9, adult (HCC) 09/17/2020   Uterine leiomyoma 08/26/2020   Aortic atherosclerosis 08/26/2020   Diastasis of rectus abdominis 08/08/2020   Encounter for health maintenance examination with abnormal findings 07/05/2020   Bone spur of right ankle 07/05/2020   S/P TKR (total knee replacement) using cement, left 07/27/2019   Encounter for general adult medical examination with abnormal findings 07/01/2019   Adjustment insomnia 07/01/2019   Encounter for screening mammogram for malignant neoplasm of breast 07/01/2019   Routine cervical smear 07/01/2019   Ventral hernia without obstruction or gangrene 07/01/2019   Pain and swelling of ankle, right 01/13/2019   Inflammatory polyarthritis (HCC) 12/18/2017   Impaired fasting glucose 09/23/2017   Obesity, unspecified 09/23/2017   Hypokalemia 09/23/2017   Melanocytic nevi, unspecified 09/23/2017   Pain in right knee 09/23/2017   Dysuria 09/23/2017   Pain in right ankle and joints of right foot 09/23/2017   Hypertension associated with  diabetes (HCC) 09/23/2017   Other fatigue 09/23/2017   Vitamin D  deficiency 09/23/2017   Iron deficiency anemia, unspecified 09/23/2017   OSA (obstructive sleep apnea) 09/23/2017    1. OSA (obstructive sleep apnea) (Primary) She has an excellent response to CPAP with a pressure of 15CWP. Will get her scheduled for a DME visit - For home use only DME continuous positive airway pressure (CPAP)  2. Obesity, morbid (HCC) Obesity Counseling: Had a lengthy discussion regarding patients BMI and weight issues. Patient was instructed on portion control as well as increased activity. Also discussed caloric restrictions with trying to maintain intake less than 2000 Kcal.   3. Elevated rheumatoid factor Continue with Rheum follow up    General Counseling: I have discussed the findings of the evaluation and examination with Donzell.  I have also discussed any further diagnostic evaluation thatmay be needed or ordered today. Chastidy verbalizes understanding of the findings of todays visit. We also reviewed her medications today and discussed drug interactions and side effects including but not limited excessive drowsiness and altered mental states. We also discussed that there is always a risk not just to her but also people around her. she has been encouraged to call the office with any questions or concerns that should arise related to todays visit.  No orders of the defined types were placed in this encounter.    Time spent: 71  I have personally obtained a history, examined the patient, evaluated laboratory and imaging results, formulated the assessment and plan and placed orders.    Elfreda DELENA Bathe, MD [facility] Pulmonary and Critical Care Sleep medicine

## 2024-08-03 ENCOUNTER — Telehealth: Payer: Self-pay | Admitting: Internal Medicine

## 2024-08-03 NOTE — Telephone Encounter (Signed)
 New cpap setup orders emailed to [facility] per patient's request-Toni

## 2024-08-12 ENCOUNTER — Other Ambulatory Visit: Payer: Self-pay | Admitting: Nurse Practitioner

## 2024-08-12 DIAGNOSIS — E1159 Type 2 diabetes mellitus with other circulatory complications: Secondary | ICD-10-CM

## 2024-08-26 ENCOUNTER — Encounter: Payer: Self-pay | Admitting: Nurse Practitioner

## 2024-08-26 ENCOUNTER — Ambulatory Visit: Payer: Medicare HMO | Admitting: Nurse Practitioner

## 2024-08-26 VITALS — BP 135/80 | HR 63 | Temp 97.4°F | Resp 16 | Ht 67.0 in | Wt 243.2 lb

## 2024-08-26 DIAGNOSIS — K760 Fatty (change of) liver, not elsewhere classified: Secondary | ICD-10-CM | POA: Diagnosis not present

## 2024-08-26 DIAGNOSIS — I152 Hypertension secondary to endocrine disorders: Secondary | ICD-10-CM

## 2024-08-26 DIAGNOSIS — R300 Dysuria: Secondary | ICD-10-CM | POA: Diagnosis not present

## 2024-08-26 DIAGNOSIS — M0579 Rheumatoid arthritis with rheumatoid factor of multiple sites without organ or systems involvement: Secondary | ICD-10-CM

## 2024-08-26 DIAGNOSIS — Z0001 Encounter for general adult medical examination with abnormal findings: Secondary | ICD-10-CM

## 2024-08-26 DIAGNOSIS — E1169 Type 2 diabetes mellitus with other specified complication: Secondary | ICD-10-CM | POA: Diagnosis not present

## 2024-08-26 DIAGNOSIS — E1159 Type 2 diabetes mellitus with other circulatory complications: Secondary | ICD-10-CM | POA: Diagnosis not present

## 2024-08-26 DIAGNOSIS — I700 Atherosclerosis of aorta: Secondary | ICD-10-CM | POA: Diagnosis not present

## 2024-08-26 NOTE — Progress Notes (Signed)
 [facility] [address] [place], [place] [address]  Internal MEDICINE  Office Visit Note  Patient Name: Jill Banks  [id number]  [id number]  Date of Service: 08/26/2024  Chief Complaint  Patient presents with   Diabetes   Hypertension   Hyperlipidemia   Medicare Wellness    HPI Jill Banks presents for an annual well visit and physical exam.  Well-appearing 68 y.o. female with hypertension, aortic atherosclerosis, OSA, diabetes, and NAFLD and a new diagnosis of rheumatoid arthritis.  Routine CRC screening: due for colonoscopy in 2030 Routine mammogram: scheduled for next week DEXA scan:done in 2022  Pap smear: discontinued, aged out  Eye exam: due Foot exam: done Labs: routine labs previosuly done and resultsdiscused at prior visit.  New or worsening pain: chronic joint pains, recently diagnosed with RA and is starting treatment.  Other concerns: none     08/26/2024    2:45 PM 08/26/2023    9:00 AM 08/20/2022   10:56 AM  MMSE - Mini Mental State Exam  Orientation to time 5 5 5   Orientation to Place 5 5 5   Registration 3 3 3   Attention/ Calculation 5 5 5   Recall 3 3 3   Language- name 2 objects 2 2 2   Language- repeat 1 1 1   Language- follow 3 step command 3 3 3   Language- read & follow direction 1 1 1   Write a sentence 1 1 1   Copy design 1 1 1   Total score 30 30 30     Functional Status Survey: Is the patient deaf or have difficulty hearing?: No Does the patient have difficulty seeing, even when wearing glasses/contacts?: No Does the patient have difficulty concentrating, remembering, or making decisions?: No Does the patient have difficulty walking or climbing stairs?: Yes Does the patient have difficulty dressing or bathing?: No Does the patient have difficulty doing errands alone such as visiting a doctor's office or shopping?: No     02/18/2022   12:02 PM 08/20/2022   10:55 AM 08/26/2023    8:59 AM 10/16/2023    1:54 PM 08/26/2024    2:45 PM   Fall Risk  Falls in the past year? 0 0 0 0 0  Was there an injury with Fall?  0  0   0  Fall Risk Category Calculator  0 0  0  Fall Risk Category (Retired)  Low      (RETIRED) Patient Fall Risk Level Low fall risk  Low fall risk      Patient at Risk for Falls Due to No Fall Risks No Fall Risks No Fall Risks No Fall Risks   Fall risk Follow up Falls evaluation completed  Falls evaluation completed  Falls evaluation completed Falls evaluation completed Falls evaluation completed     Data saved with a previous flowsheet row definition       08/26/2024    2:45 PM  Depression screen PHQ 2/9  Decreased Interest 0  Down, Depressed, Hopeless 0  PHQ - 2 Score 0       Current Medication: Outpatient Encounter Medications as of 08/26/2024  Medication Sig   Accu-Chek Softclix Lancets lancets Use as instructed to check blood sugars once daily   bisoprolol  (ZEBETA ) 10 MG tablet Take 1 tablet (10 mg total) by mouth daily.   Blood Glucose Monitoring Suppl (ONETOUCH VERIO FLEX SYSTEM) w/Device KIT    calcitRIOL  (ROCALTROL ) 0.25 MCG capsule TAKE (1) CAPSULE BY MOUTH ONCE DAILY.   cyanocobalamin  (VITAMIN B12) 1000 MCG tablet Take  1 tablet (1,000 mcg total) by mouth every morning.   diclofenac sodium (VOLTAREN) 1 % GEL Apply 2 g topically 4 (four) times daily as needed (knee pain).   glucose blood test strip Use as instructed to check blood sugars once daily E11.65   ibuprofen  (ADVIL ) 800 MG tablet Take 800 mg by mouth every 8 (eight) hours as needed for mild pain (pain score 1-3).   potassium chloride  SA (KLOR-CON  M) 20 MEQ tablet Take 1 tablet (20 mEq total) by mouth 2 (two) times daily.   valsartan -hydrochlorothiazide  (DIOVAN -HCT) 80-12.5 MG tablet Take 1 tablet by mouth daily.   No facility-administered encounter medications on file as of 08/26/2024.    Surgical History: Past Surgical History:  Procedure Laterality Date   APPLICATION OF WOUND VAC 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   Procedure: APPLICATION  OF WOUND VAC;  Surgeon: Kathlynn Sharper, MD;  Location: [facility] ORS;  Service: Orthopedics;  Laterality: Left;  #[id number]   COLONOSCOPY WITH PROPOFOL  N/A 09/25/2021   Procedure: COLONOSCOPY WITH PROPOFOL ;  Surgeon: Unk Corinn Skiff, MD;  Location: [facility] ENDOSCOPY;  Service: Gastroenterology;  Laterality: N/A;   DILATATION & CURETTAGE/HYSTEROSCOPY WITH MYOSURE  10/17/2020   Procedure: DILATATION & CURETTAGE/HYSTEROSCOPY WITH MYOSURE POLYPECTOMY;  Surgeon: Victor Claudell SAUNDERS, MD;  Location: [facility] ORS;  Service: Gynecology;;   DILATION AND CURETTAGE OF UTERUS     x 3   TOE SURGERY Left    second toe   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   Procedure: LEFT TOTAL KNEE ARTHROPLASTY;  Surgeon: Kathlynn Sharper, MD;  Location: [facility] ORS;  Service: Orthopedics;  Laterality: Left;   TOTAL KNEE ARTHROPLASTY Right 02/14/2023   Procedure: RIGHT TOTAL KNEE ARTHROPLASTY;  Surgeon: Vernetta Lonni GRADE, MD;  Location: [facility] ORS;  Service: Orthopedics;  Laterality: Right;   XI ROBOTIC LAPAROSCOPIC ASSISTED APPENDECTOMY N/A 10/01/2023   Procedure: XI ROBOTIC LAPAROSCOPIC ASSISTED APPENDECTOMY;  Surgeon: Kallie Manuelita BROCKS, MD;  Location: [facility];  Service: General;  Laterality: N/A;    Medical History: Past Medical History:  Diagnosis Date   Anxiety    Arthritis    Blood dyscrasia    Thrombocytopenia   Bruises easily    COVID-19 04/2019   Diabetes mellitus without complication (HCC)    Headache    HLD (hyperlipidemia)    Hypertension    Leg pain    Pneumonia    about 5 yrs ago   Sleep apnea    no CPAP    Family History: Family History  Problem Relation Age of Onset   Hypertension Father    Stroke Father    Hypertension Brother    Stroke Brother    Hyperlipidemia Brother    Breast cancer Maternal Aunt    Asthma Son     Social History   Socioeconomic History   Marital status: Married    Spouse name: Not on file   Number of children: Not on file   Years of education: Not on file   Highest  education level: Not on file  Occupational History   Not on file  Tobacco Use   Smoking status: Former    Current packs/day: 0.00    Average packs/day: 0.3 packs/day for 2.0 years (0.5 ttl pk-yrs)    Types: Cigarettes    Start date: 2007    Quit date: 2009    Years since quitting: 16.9    Passive exposure: Never   Smokeless tobacco: Never  Vaping Use   Vaping status: Never Used  Substance and Sexual Activity   Alcohol  use: Yes    Comment: ocassionally   Drug use: No   Sexual activity: Yes  Other Topics Concern   Not on file  Social History Narrative   Not on file   Social Drivers of Health   Tobacco Use: Medium Risk (08/26/2024)   Patient History    Smoking Tobacco Use: Former    Smokeless Tobacco Use: Never    Passive Exposure: Never  Physicist, Medical Strain: Low Risk  (08/10/2024)   [facility] from [facility]   Overall Financial Resource Strain (CARDIA)    Difficulty of Paying Living Expenses: Not very hard  Food Insecurity: No Food Insecurity (08/10/2024)   Received from [facility]   Epic    Within the past 12 months, you worried that your food would run out before you got the money to buy more.: Never true    Within the past 12 months, the food you bought just didn't last and you didn't have money to get more.: Never true  Transportation Needs: No Transportation Needs (08/10/2024)   Received from [facility] - Transportation    In the past 12 months, has lack of transportation kept you from medical appointments or from getting medications?: No    Lack of Transportation (Non-Medical): No  Physical Activity: Not on file  Stress: Not on file  Social Connections: Unknown (10/02/2023)   Social Connection and Isolation Panel    Frequency of Communication with Friends and Family: More than three times a week    Frequency of Social Gatherings with Friends and Family: More than three times a week     Attends Religious Services: Not on file    Active Member of Clubs or Organizations: Yes    Attends Banker Meetings: More than 4 times per year    Marital Status: Married  Catering Manager Violence: Not At Risk (10/02/2023)   Humiliation, Afraid, Rape, and Kick questionnaire    Fear of Current or Ex-Partner: No    Emotionally Abused: No    Physically Abused: No    Sexually Abused: No  Depression (PHQ2-9): Low Risk (08/26/2024)   Depression (PHQ2-9)    PHQ-2 Score: 0  Alcohol Screen: Low Risk (02/18/2022)   Alcohol Screen    Last Alcohol Screening Score (AUDIT): 1  Housing: Low Risk  (08/10/2024)   Received from [facility]   Epic    In the last 12 months, was there a time when you were not able to pay the mortgage or rent on time?: No    In the past 12 months, how many times have you moved where you were living?: 0    At any time in the past 12 months, were you homeless or living in a shelter (including now)?: No  Utilities: Not At Risk (08/10/2024)   Received from [facility]   Epic    In the past 12 months has the electric, gas, oil, or water  company threatened to shut off services in your home?: No  Health Literacy: Not on file      Review of Systems  Constitutional:  Negative for activity change, appetite change, chills, fatigue, fever and unexpected weight change.  HENT: Negative.  Negative for congestion, ear pain, rhinorrhea, sore throat and trouble swallowing.   Eyes: Negative.   Respiratory: Negative.  Negative for cough, chest tightness, shortness of breath and wheezing.   Cardiovascular: Negative.  Negative for chest pain and palpitations.  Gastrointestinal: Negative.  Negative for abdominal pain, blood in stool, constipation, diarrhea, nausea and vomiting.  Endocrine: Negative.   Genitourinary: Negative.  Negative for difficulty urinating, dysuria, frequency, hematuria and urgency.  Musculoskeletal:  Positive for  arthralgias, back pain and joint swelling. Negative for myalgias and neck pain.  Skin: Negative.  Negative for rash and wound.  Allergic/Immunologic: Negative.  Negative for immunocompromised state.  Neurological: Negative.  Negative for dizziness, seizures, numbness and headaches.  Hematological: Negative.   Psychiatric/Behavioral: Negative.  Negative for behavioral problems, self-injury and suicidal ideas. The patient is not nervous/anxious.     Vital Signs: BP 135/80   Pulse 63   Temp (!) 97.4 F (36.3 C)   Resp 16   Ht 5' 7 (1.702 m)   Wt 243 lb 3.2 oz (110.3 kg)   SpO2 95%   BMI 38.09 kg/m    Physical Exam Vitals reviewed.  Constitutional:      General: Jill Banks is awake. Jill Banks is not in acute distress.    Appearance: Normal appearance. Jill Banks is well-developed and well-groomed. Jill Banks is obese. Jill Banks is not ill-appearing or diaphoretic.  HENT:     Head: Normocephalic and atraumatic.     Mouth/Throat:     Lips: Pink.     Pharynx: Uvula midline.  Eyes:     General: Lids are normal. Vision grossly intact. Gaze aligned appropriately.     Pupils: Pupils are equal, round, and reactive to light.     Funduscopic exam:    Right eye: Red reflex present.        Left eye: Red reflex present. Neck:     Thyroid: No thyromegaly.     Vascular: No JVD.     Trachea: Trachea and phonation normal. No tracheal deviation.  Cardiovascular:     Rate and Rhythm: Normal rate and regular rhythm.     Pulses: Normal pulses.          Dorsalis pedis pulses are 2+ on the right side and 2+ on the left side.       Posterior tibial pulses are 2+ on the right side and 2+ on the left side.     Heart sounds: Normal heart sounds, S1 normal and S2 normal. No murmur heard.    No friction rub. No gallop.  Pulmonary:     Effort: Pulmonary effort is normal. No accessory muscle usage or respiratory distress.     Breath sounds: Normal breath sounds and air entry. No stridor. No wheezing or rales.  Chest:     Chest  wall: No tenderness.     Comments: Declined clinical breast exam, gets yearly mamograms Abdominal:     Palpations: Abdomen is soft. There is no shifting dullness, fluid wave or pulsatile mass.  Musculoskeletal:     Right foot: Normal range of motion. No deformity, bunion, Charcot foot, foot drop or prominent metatarsal heads.     Left foot: Normal range of motion. No deformity, bunion, Charcot foot, foot drop or prominent metatarsal heads.  Feet:     Right foot:     Protective Sensation: 6 sites tested.  6 sites sensed.     Skin integrity: Skin integrity normal. No ulcer, blister, skin breakdown, erythema, warmth, callus, dry skin or fissure.     Toenail Condition: Right toenails are normal.     Left foot:     Protective Sensation: 6 sites tested.  6 sites sensed.     Skin integrity: Skin integrity normal. No ulcer, blister, skin breakdown,  erythema, warmth, callus, dry skin or fissure.     Toenail Condition: Left toenails are normal.  Neurological:     Mental Status: Jill Banks is alert and oriented to person, place, and time.     Cranial Nerves: No cranial nerve deficit.     Motor: No abnormal muscle tone.     Coordination: Coordination normal.     Gait: Gait normal.     Deep Tendon Reflexes: Reflexes are normal and symmetric.  Psychiatric:        Mood and Affect: Mood normal.        Behavior: Behavior normal. Behavior is cooperative.        Thought Content: Thought content normal.        Judgment: Judgment normal.        Assessment/Plan: 1. Encounter for routine adult health examination with abnormal findings (Primary) ***  2. Rheumatoid arthritis involving multiple sites with positive rheumatoid factor (HCC) ***  3. Type 2 diabetes mellitus with other specified complication, without long-term current use of insulin  (HCC) ***  4. Hypertension associated with diabetes (HCC) ***  5. Aortic atherosclerosis ***  6. NAFLD (nonalcoholic fatty liver disease) ***  7.  Dysuria *** - UA/M w/rflx Culture, Routine     General Counseling: Rayelle verbalizes understanding of the findings of todays visit and agrees with plan of treatment. I have discussed any further diagnostic evaluation that may be needed or ordered today. We also reviewed her medications today. Jill Banks has been encouraged to call the office with any questions or concerns that should arise related to todays visit.    Orders Placed This Encounter  Procedures   UA/M w/rflx Culture, Routine    No orders of the defined types were placed in this encounter.   Return in about 1 month (around 09/26/2024) for F/U, Karielle Davidow PCP diabetes and weight loss management.   Total time spent:30 Minutes Time spent includes review of chart, medications, test results, and follow up plan with the patient.   [place] Controlled Substance Database was reviewed by me.  This patient was seen by Mardy Maxin, FNP-C in collaboration with Dr. Sigrid Bathe as a part of collaborative care agreement.  Rossie Scarfone R. Maxin, MSN, FNP-C Internal medicine

## 2024-08-27 ENCOUNTER — Encounter: Payer: Self-pay | Admitting: Nurse Practitioner

## 2024-08-29 LAB — MICROSCOPIC EXAMINATION
Bacteria, UA: NONE SEEN
Casts: NONE SEEN /LPF

## 2024-08-29 LAB — UA/M W/RFLX CULTURE, ROUTINE
Bilirubin, UA: NEGATIVE
Glucose, UA: NEGATIVE
Ketones, UA: NEGATIVE
Nitrite, UA: NEGATIVE
Protein,UA: NEGATIVE
RBC, UA: NEGATIVE
Specific Gravity, UA: 1.017 (ref 1.005–1.030)
Urobilinogen, Ur: 1 mg/dL (ref 0.2–1.0)
pH, UA: 5.5 (ref 5.0–7.5)

## 2024-08-29 LAB — URINE CULTURE, REFLEX

## 2024-09-30 ENCOUNTER — Encounter: Payer: Self-pay | Admitting: Nurse Practitioner

## 2024-09-30 ENCOUNTER — Ambulatory Visit: Admitting: Nurse Practitioner

## 2024-09-30 VITALS — BP 138/86 | HR 73 | Temp 97.0°F | Resp 16 | Ht 67.0 in | Wt 241.8 lb

## 2024-09-30 DIAGNOSIS — E1169 Type 2 diabetes mellitus with other specified complication: Secondary | ICD-10-CM

## 2024-09-30 DIAGNOSIS — M0579 Rheumatoid arthritis with rheumatoid factor of multiple sites without organ or systems involvement: Secondary | ICD-10-CM | POA: Diagnosis not present

## 2024-09-30 DIAGNOSIS — I152 Hypertension secondary to endocrine disorders: Secondary | ICD-10-CM | POA: Diagnosis not present

## 2024-09-30 DIAGNOSIS — E1159 Type 2 diabetes mellitus with other circulatory complications: Secondary | ICD-10-CM

## 2024-09-30 DIAGNOSIS — E876 Hypokalemia: Secondary | ICD-10-CM

## 2024-09-30 DIAGNOSIS — I700 Atherosclerosis of aorta: Secondary | ICD-10-CM | POA: Diagnosis not present

## 2024-09-30 MED ORDER — POTASSIUM CHLORIDE CRYS ER 20 MEQ PO TBCR: 20.0000 meq | EXTENDED_RELEASE_TABLET | Freq: Two times a day (BID) | ORAL | 5 refills | Status: AC

## 2024-09-30 NOTE — Progress Notes (Signed)
 [facility] [address] [place], [place] [address]  Internal MEDICINE  Office Visit Note  Patient Name: Jill Banks  [id number]  [id number]  Date of Service: 09/30/2024  Chief Complaint  Patient presents with   Diabetes   Hypertension   Hyperlipidemia   Follow-up    HPI Jill Banks presents for a follow-up visit for weight loss, RA, diabetes, hypertension and high cholesterol.  Weight loss -- down a couple of lbs since last month.  Has started on medication for RA and is moving better with less pain and has not needed a cane.  Diabetes -- due for urine ACR. A1c is stable. Diet controlled.  High cholesterol -- diet controlled Hypertension -- controlled with current medication.   Current Medication: Outpatient Encounter Medications as of 09/30/2024  Medication Sig   folic acid  (FOLVITE ) 1 MG tablet Take 1 mg by mouth.   methotrexate (RHEUMATREX) 2.5 MG tablet Take 20 mg by mouth.   bisoprolol  (ZEBETA ) 10 MG tablet Take 1 tablet (10 mg total) by mouth daily.   calcitRIOL  (ROCALTROL ) 0.25 MCG capsule TAKE (1) CAPSULE BY MOUTH ONCE DAILY.   cyanocobalamin  (VITAMIN B12) 1000 MCG tablet Take 1 tablet (1,000 mcg total) by mouth every morning.   diclofenac sodium (VOLTAREN) 1 % GEL Apply 2 g topically 4 (four) times daily as needed (knee pain).   potassium chloride  SA (KLOR-CON  M) 20 MEQ tablet Take 1 tablet (20 mEq total) by mouth 2 (two) times daily.   valsartan -hydrochlorothiazide  (DIOVAN -HCT) 80-12.5 MG tablet Take 1 tablet by mouth daily.   [DISCONTINUED] potassium chloride  SA (KLOR-CON  M) 20 MEQ tablet Take 1 tablet (20 mEq total) by mouth 2 (two) times daily.   No facility-administered encounter medications on file as of 09/30/2024.    Surgical History: Past Surgical History:  Procedure Laterality Date   APPLICATION OF WOUND VAC 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   Procedure: APPLICATION OF WOUND VAC;  Surgeon: Kathlynn Sharper, MD;  Location: [facility] ORS;  Service: Orthopedics;   Laterality: Left;  #[id number]   COLONOSCOPY WITH PROPOFOL  N/A 09/25/2021   Procedure: COLONOSCOPY WITH PROPOFOL ;  Surgeon: Unk Corinn Skiff, MD;  Location: [facility];  Service: Gastroenterology;  Laterality: N/A;   DILATATION & CURETTAGE/HYSTEROSCOPY WITH MYOSURE  10/17/2020   Procedure: DILATATION & CURETTAGE/HYSTEROSCOPY WITH MYOSURE POLYPECTOMY;  Surgeon: Victor Claudell JONELLE, MD;  Location: [facility] ORS;  Service: Gynecology;;   DILATION AND CURETTAGE OF UTERUS     x 3   TOE SURGERY Left    second toe   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   Procedure: LEFT TOTAL KNEE ARTHROPLASTY;  Surgeon: Kathlynn Sharper, MD;  Location: [facility] ORS;  Service: Orthopedics;  Laterality: Left;   TOTAL KNEE ARTHROPLASTY Right 02/14/2023   Procedure: RIGHT TOTAL KNEE ARTHROPLASTY;  Surgeon: Vernetta Lonni GRADE, MD;  Location: [facility];  Service: Orthopedics;  Laterality: Right;   XI ROBOTIC LAPAROSCOPIC ASSISTED APPENDECTOMY N/A 10/01/2023   Procedure: XI ROBOTIC LAPAROSCOPIC ASSISTED APPENDECTOMY;  Surgeon: Kallie Manuelita BROCKS, MD;  Location: [facility];  Service: General;  Laterality: N/A;    Medical History: Past Medical History:  Diagnosis Date   Anxiety    Arthritis    Blood dyscrasia    Thrombocytopenia   Bruises easily    COVID-19 04/2019   Diabetes mellitus without complication (HCC)    Headache    HLD (hyperlipidemia)    Hypertension    Leg pain    Pneumonia    about 5 yrs ago   Sleep apnea  no CPAP    Family History: Family History  Problem Relation Age of Onset   Hypertension Father    Stroke Father    Hypertension Brother    Stroke Brother    Hyperlipidemia Brother    Breast cancer Maternal Aunt    Asthma Son     Social History   Socioeconomic History   Marital status: Married    Spouse name: Not on file   Number of children: Not on file   Years of education: Not on file   Highest education level: Not on file  Occupational History   Not on file  Tobacco Use    Smoking status: Former    Current packs/day: 0.00    Average packs/day: 0.3 packs/day for 2.0 years (0.5 ttl pk-yrs)    Types: Cigarettes    Start date: 2007    Quit date: 2009    Years since quitting: 17.0    Passive exposure: Never   Smokeless tobacco: Never  Vaping Use   Vaping status: Never Used  Substance and Sexual Activity   Alcohol use: Yes    Comment: ocassionally   Drug use: No   Sexual activity: Yes  Other Topics Concern   Not on file  Social History Narrative   Not on file   Social Drivers of Health   Tobacco Use: Medium Risk (09/30/2024)   Patient History    Smoking Tobacco Use: Former    Smokeless Tobacco Use: Never    Passive Exposure: Never  Physicist, Medical Strain: Low Risk  (08/10/2024)   Received from [facility]   Overall Financial Resource Strain (CARDIA)    Difficulty of Paying Living Expenses: Not very hard  Food Insecurity: No Food Insecurity (08/10/2024)   Received from [facility]   Epic    Within the past 12 months, you worried that your food would run out before you got the money to buy more.: Never true    Within the past 12 months, the food you bought just didn't last and you didn't have money to get more.: Never true  Transportation Needs: No Transportation Needs (08/10/2024)   Received from [facility] - Transportation    In the past 12 months, has lack of transportation kept you from medical appointments or from getting medications?: No    Lack of Transportation (Non-Medical): No  Physical Activity: Not on file  Stress: Not on file  Social Connections: Unknown (10/02/2023)   Social Connection and Isolation Panel    Frequency of Communication with Friends and Family: More than three times a week    Frequency of Social Gatherings with Friends and Family: More than three times a week    Attends Religious Services: Not on file    Active Member of Clubs or Organizations: Yes     Attends Banker Meetings: More than 4 times per year    Marital Status: Married  Catering Manager Violence: Not At Risk (10/02/2023)   Humiliation, Afraid, Rape, and Kick questionnaire    Fear of Current or Ex-Partner: No    Emotionally Abused: No    Physically Abused: No    Sexually Abused: No  Depression (PHQ2-9): Low Risk (08/26/2024)   Depression (PHQ2-9)    PHQ-2 Score: 0  Alcohol Screen: Low Risk (02/18/2022)   Alcohol Screen    Last Alcohol Screening Score (AUDIT): 1  Housing: Low Risk  (08/10/2024)   Received from [facility]  Epic    In the last 12 months, was there a time when you were not able to pay the mortgage or rent on time?: No    In the past 12 months, how many times have you moved where you were living?: 0    At any time in the past 12 months, were you homeless or living in a shelter (including now)?: No  Utilities: Not At Risk (08/10/2024)   Received from [facility]   Epic    In the past 12 months has the electric, gas, oil, or water  company threatened to shut off services in your home?: No  Health Literacy: Not on file      Review of Systems  Constitutional:  Positive for fatigue and unexpected weight change. Negative for chills.  HENT:  Negative for congestion, postnasal drip, rhinorrhea, sneezing and sore throat.   Eyes:  Negative for redness.  Respiratory: Negative.  Negative for cough, chest tightness, shortness of breath and wheezing.   Cardiovascular: Negative.  Negative for chest pain and palpitations.  Gastrointestinal:  Negative for abdominal pain, constipation, diarrhea, nausea and vomiting.  Genitourinary:  Negative for dysuria and frequency.  Musculoskeletal:  Positive for arthralgias, joint swelling, myalgias and neck pain. Negative for back pain.  Skin:  Negative for rash.  Neurological: Negative.  Negative for tremors and numbness.  Hematological:  Negative for adenopathy. Does not  bruise/bleed easily.  Psychiatric/Behavioral:  Negative for behavioral problems (Depression), self-injury, sleep disturbance and suicidal ideas. The patient is not nervous/anxious.     Vital Signs: BP 138/86   Pulse 73   Temp (!) 97 F (36.1 C)   Resp 16   Ht 5' 7 (1.702 m)   Wt 241 lb 12.8 oz (109.7 kg)   SpO2 93%   BMI 37.87 kg/m    Physical Exam Vitals reviewed.  Constitutional:      General: She is not in acute distress.    Appearance: Normal appearance. She is obese. She is not ill-appearing.  HENT:     Head: Normocephalic and atraumatic.  Eyes:     Pupils: Pupils are equal, round, and reactive to light.  Neck:     Trachea: Trachea and phonation normal.  Cardiovascular:     Rate and Rhythm: Normal rate and regular rhythm.  Pulmonary:     Effort: Pulmonary effort is normal. No respiratory distress.  Musculoskeletal:     Right hand: Swelling and tenderness present. Decreased range of motion. Decreased strength.     Left hand: Swelling and tenderness present. Decreased range of motion. Decreased strength.     Cervical back: Pain with movement and muscular tenderness present. Decreased range of motion.     Right lower leg: No edema.     Left lower leg: No edema.  Skin:    General: Skin is warm and dry.     Capillary Refill: Capillary refill takes less than 2 seconds.  Neurological:     Mental Status: She is alert.  Psychiatric:        Mood and Affect: Mood normal.        Assessment/Plan: 1. Hypokalemia Continue potassium supplement as prescribed.  - potassium chloride  SA (KLOR-CON  M) 20 MEQ tablet; Take 1 tablet (20 mEq total) by mouth 2 (two) times daily.  Dispense: 60 tablet; Refill: 5  2. Rheumatoid arthritis involving multiple sites with positive rheumatoid factor (HCC) (Primary) Currently on methotrexate and folic acid  and her joint pains have improved.   3. Type  2 diabetes mellitus with other specified complication, without long-term current use of  insulin  (HCC) Urine sent to lab for ACR.  - Urine Microalbumin w/creat. ratio  4. Hypertension associated with diabetes (HCC) Stable, continue bisoprolol  and valsartan -hydrochlorothiazide  as prescribed.   5. Aortic atherosclerosis Continue low fat low cholesterol diet    General Counseling: Jill Banks verbalizes understanding of the findings of todays visit and agrees with plan of treatment. I have discussed any further diagnostic evaluation that may be needed or ordered today. We also reviewed her medications today. she has been encouraged to call the office with any questions or concerns that should arise related to todays visit.    Orders Placed This Encounter  Procedures   Urine Microalbumin w/creat. ratio    Meds ordered this encounter  Medications   potassium chloride  SA (KLOR-CON  M) 20 MEQ tablet    Sig: Take 1 tablet (20 mEq total) by mouth 2 (two) times daily.    Dispense:  60 tablet    Refill:  5    Return in about 3 months (around 12/29/2024) for F/U, Recheck A1C, Tymier Lindholm PCP.   Total time spent:30 Minutes Time spent includes review of chart, medications, test results, and follow up plan with the patient.   [place] Controlled Substance Database was reviewed by me.  This patient was seen by Mardy Maxin, FNP-C in collaboration with Dr. Sigrid Bathe as a part of collaborative care agreement.   Dynesha Woolen R. Maxin, MSN, FNP-C Internal medicine

## 2024-10-01 LAB — MICROALBUMIN / CREATININE URINE RATIO
Creatinine, Urine: 135 mg/dL
Microalb/Creat Ratio: 23 mg/g{creat} (ref 0–29)
Microalbumin, Urine: 30.5 ug/mL

## 2024-10-07 NOTE — Progress Notes (Unsigned)
 "  Office Visit Note  Patient: Jill Banks             Date of Birth: July 05, 1956           MRN: [id number]             PCP: Liana Fish, NP Referring: Liana Fish, NP Visit Date: 10/21/2024 Occupation: Data Unavailable  Subjective:  No chief complaint on file.   History of Present Illness: Jill Banks is a 69 y.o. female ***     Activities of Daily Living:  Patient reports morning stiffness for *** {minute/hour:19697}.   Patient {ACTIONS;DENIES/REPORTS:21021675::Denies} nocturnal pain.  Difficulty dressing/grooming: {ACTIONS;DENIES/REPORTS:21021675::Denies} Difficulty climbing stairs: {ACTIONS;DENIES/REPORTS:21021675::Denies} Difficulty getting out of chair: {ACTIONS;DENIES/REPORTS:21021675::Denies} Difficulty using hands for taps, buttons, cutlery, and/or writing: {ACTIONS;DENIES/REPORTS:21021675::Denies}  No Rheumatology ROS completed.   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 07/07/2024   Elevated rheumatoid factor 07/07/2024   Acute perforated appendicitis 10/01/2023   NAFLD (nonalcoholic fatty liver disease) 88/78/7975   Diabetes mellitus (HCC) 08/07/2023   Abnormal weight gain 08/07/2023   Status post total right knee replacement 02/14/2023   Thrombocytopenia 10/15/2022   Endometrial polyp    Postmenopausal bleeding    Uterine enlargement 09/17/2020   BMI 40.0-44.9, adult (HCC) 09/17/2020   Uterine leiomyoma 08/26/2020   Aortic atherosclerosis 08/26/2020   Diastasis of rectus abdominis 08/08/2020   Encounter for health maintenance examination with abnormal findings 07/05/2020   Bone spur of right ankle 07/05/2020   S/P TKR (total knee replacement) using cement, left 07/27/2019   Encounter for  general adult medical examination with abnormal findings 07/01/2019   Adjustment insomnia 07/01/2019   Encounter for screening mammogram for malignant neoplasm of breast 07/01/2019   Routine cervical smear 07/01/2019   Ventral hernia without obstruction or gangrene 07/01/2019   Pain and swelling of ankle, right 01/13/2019   Rheumatoid arthritis involving multiple sites with positive rheumatoid factor (HCC) 12/18/2017   Impaired fasting glucose 09/23/2017   Obesity, unspecified 09/23/2017   Hypokalemia 09/23/2017   Melanocytic nevi, unspecified 09/23/2017   Pain in right knee 09/23/2017   Dysuria 09/23/2017   Pain in right ankle and joints of right foot 09/23/2017   Hypertension associated with diabetes (HCC) 09/23/2017   Other fatigue 09/23/2017   Vitamin D  deficiency 09/23/2017   Iron deficiency anemia, unspecified 09/23/2017   OSA (obstructive sleep apnea) 09/23/2017    Past Medical History:  Diagnosis Date   Anxiety    Arthritis    Blood dyscrasia    Thrombocytopenia   Bruises easily    COVID-19 04/2019   Diabetes mellitus without complication (HCC)    Headache    HLD (hyperlipidemia)    Hypertension    Leg pain    Pneumonia    about 5 yrs ago   Sleep apnea    no CPAP    Family History  Problem Relation Age of Onset   Hypertension Father    Stroke Father    Hypertension Brother    Stroke Brother    Hyperlipidemia Brother    Breast cancer Maternal Aunt    Asthma Son    Past Surgical History:  Procedure Laterality Date   APPLICATION OF WOUND VAC 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   Procedure: APPLICATION OF WOUND VAC;  Surgeon: Kathlynn Sharper, MD;  Location: [facility] OR[facility];  Service: Orthopedics;  Laterality: Left;  #[id number]   COLONOSCOPY WITH PROPOFOL  N/A 09/25/2021   Procedure: COLONOSCOPY WITH PROPOFOL ;  Surgeon: Unk Corinn Skiff, MD;  Location: [facility] ENDOSCOPY;  Service: Gastroenterology;  Laterality: N/A;   DILATATION & CURETTAGE/HYSTEROSCOPY WITH MYOSURE  10/17/2020   Procedure:  DILATATION & CURETTAGE/HYSTEROSCOPY WITH MYOSURE POLYPECTOMY;  Surgeon: Victor Claudell SAUNDERS, MD;  Location: [facility] OR[facility];  Service: Gynecology;;   DILATION AND CURETTAGE OF UTERUS     x 3   TOE SURGERY Left    second toe   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   Procedure: LEFT TOTAL KNEE ARTHROPLASTY;  Surgeon: Kathlynn Sharper, MD;  Location: [facility] OR[facility];  Service: Orthopedics;  Laterality: Left;   TOTAL KNEE ARTHROPLASTY Right 02/14/2023   Procedure: RIGHT TOTAL KNEE ARTHROPLASTY;  Surgeon: Vernetta Lonni GRADE, MD;  Location: [facility];  Service: Orthopedics;  Laterality: Right;   XI ROBOTIC LAPAROSCOPIC ASSISTED APPENDECTOMY N/A 10/01/2023   Procedure: XI ROBOTIC LAPAROSCOPIC ASSISTED APPENDECTOMY;  Surgeon: Kallie Manuelita BROCKS, MD;  Location: [facility];  Service: General;  Laterality: N/A;   Social History[1] Social History   Social History Narrative   Not on file     Immunization History  Administered Date(s) Administered   Moderna Covid-19 Fall Seasonal Vaccine 76yrs & older 06/24/2022   Moderna Sars-Covid-2 Vaccination 01/12/2020, 02/09/2020, 08/23/2020, 03/21/2021     Objective: Vital Signs: There were no vitals taken for this visit.   Physical Exam   Musculoskeletal Exam: ***  CDAI Exam: CDAI Score: -- Patient Global: --; Provider Global: -- Swollen: --; Tender: -- Joint Exam 10/21/2024   No joint exam has been documented for this visit   There is currently no information documented on the homunculus. Go to the Rheumatology activity and complete the homunculus joint exam.  Investigation: No additional findings.  Imaging: No results found.  Recent Labs: Lab Results  Component Value Date   WBC 7.7 06/28/2024   HGB 13.7 06/28/2024   PLT 162 06/28/2024   NA 142 06/28/2024   K 3.7 06/28/2024   CL 103 06/28/2024   CO2 25 06/28/2024   GLUCOSE 87 06/28/2024   BUN 13 06/28/2024   CREATININE 0.67 06/28/2024   BILITOT 0.4 06/28/2024   ALKPHOS 78 06/28/2024   AST 14  06/28/2024   ALT 9 06/28/2024   PROT 6.6 06/28/2024   ALBUMIN 3.9 06/28/2024   CALCIUM  9.6 06/28/2024   GFRAA 108 07/21/2020    Speciality Comments: No specialty comments available.  Procedures:  No procedures performed Allergies: Patient has no known allergies.   Assessment / Plan:     Visit Diagnoses: No diagnosis found.  Orders: No orders of the defined types were placed in this encounter.  No orders of the defined types were placed in this encounter.   Face-to-face time spent with patient was *** minutes. Greater than 50% of time was spent in counseling and coordination of care.  Follow-Up Instructions: No follow-ups on file.   Alfonso Patterson, LPN  Note - This record has been created using [employer].  Chart creation errors have been sought, but may not always  have been located. Such creation errors do not reflect on  the standard of medical care.    [1]  Social History Tobacco Use   Smoking status: Former    Current packs/day: 0.00    Average packs/day: 0.3 packs/day for 2.0 years (0.5 ttl pk-yrs)    Types: Cigarettes    Start date: 2007  Quit date: 2009    Years since quitting: 17.0    Passive exposure: Never   Smokeless tobacco: Never  Vaping Use   Vaping status: Never Used  Substance Use Topics   Alcohol use: Yes    Comment: ocassionally   Drug use: No   "

## 2024-10-21 ENCOUNTER — Ambulatory Visit

## 2024-11-23 ENCOUNTER — Ambulatory Visit: Admitting: Oncology

## 2024-11-23 ENCOUNTER — Other Ambulatory Visit

## 2024-12-30 ENCOUNTER — Ambulatory Visit: Admitting: Nurse Practitioner

## 2025-01-31 ENCOUNTER — Ambulatory Visit: Admitting: Internal Medicine

## 2025-03-31 ENCOUNTER — Ambulatory Visit: Admitting: Dermatology

## 2025-08-30 ENCOUNTER — Ambulatory Visit: Admitting: Nurse Practitioner
